# Patient Record
Sex: Female | Born: 1965
Health system: Southern US, Community
[De-identification: ages and names within clinical notes are randomized; demographics above are authoritative.]

## PROBLEM LIST (undated history)

## (undated) DIAGNOSIS — G43709 Chronic migraine without aura, not intractable, without status migrainosus: Secondary | ICD-10-CM

## (undated) DIAGNOSIS — E785 Hyperlipidemia, unspecified: Secondary | ICD-10-CM

## (undated) DIAGNOSIS — F32A Depression, unspecified: Secondary | ICD-10-CM

## (undated) DIAGNOSIS — G43109 Migraine with aura, not intractable, without status migrainosus: Secondary | ICD-10-CM

## (undated) DIAGNOSIS — K259 Gastric ulcer, unspecified as acute or chronic, without hemorrhage or perforation: Secondary | ICD-10-CM

## (undated) DIAGNOSIS — R112 Nausea with vomiting, unspecified: Secondary | ICD-10-CM

## (undated) DIAGNOSIS — F429 Obsessive-compulsive disorder, unspecified: Secondary | ICD-10-CM

## (undated) DIAGNOSIS — IMO0002 Reserved for concepts with insufficient information to code with codable children: Secondary | ICD-10-CM

## (undated) DIAGNOSIS — D649 Anemia, unspecified: Secondary | ICD-10-CM

## (undated) DIAGNOSIS — F329 Major depressive disorder, single episode, unspecified: Secondary | ICD-10-CM

## (undated) DIAGNOSIS — N920 Excessive and frequent menstruation with regular cycle: Secondary | ICD-10-CM

## (undated) DIAGNOSIS — N369 Urethral disorder, unspecified: Secondary | ICD-10-CM

## (undated) DIAGNOSIS — I10 Essential (primary) hypertension: Secondary | ICD-10-CM

## (undated) DIAGNOSIS — Z9889 Other specified postprocedural states: Secondary | ICD-10-CM

## (undated) DIAGNOSIS — D219 Benign neoplasm of connective and other soft tissue, unspecified: Secondary | ICD-10-CM

## (undated) DIAGNOSIS — K649 Unspecified hemorrhoids: Secondary | ICD-10-CM

## (undated) DIAGNOSIS — K219 Gastro-esophageal reflux disease without esophagitis: Secondary | ICD-10-CM

## (undated) HISTORY — DX: Migraine with aura, not intractable, without status migrainosus: G43.109

## (undated) HISTORY — DX: Essential (primary) hypertension: I10

## (undated) HISTORY — PX: TONSILLECTOMY: SUR1361

## (undated) HISTORY — DX: Anemia, unspecified: D64.9

## (undated) HISTORY — DX: Gastric ulcer, unspecified as acute or chronic, without hemorrhage or perforation: K25.9

## (undated) HISTORY — DX: Hyperlipidemia, unspecified: E78.5

## (undated) HISTORY — PX: ROBOTIC ASSISTED TOTAL HYSTERECTOMY: SHX6085

## (undated) HISTORY — DX: Unspecified hemorrhoids: K64.9

## (undated) HISTORY — PX: OTHER SURGICAL HISTORY: SHX169

## (undated) HISTORY — DX: Benign neoplasm of connective and other soft tissue, unspecified: D21.9

## (undated) HISTORY — DX: Obsessive-compulsive disorder, unspecified: F42.9

## (undated) HISTORY — DX: Excessive and frequent menstruation with regular cycle: N92.0

---

## 1998-08-14 ENCOUNTER — Other Ambulatory Visit: Admission: RE | Admit: 1998-08-14 | Discharge: 1998-08-14 | Payer: Self-pay | Admitting: Obstetrics and Gynecology

## 1999-08-27 ENCOUNTER — Other Ambulatory Visit: Admission: RE | Admit: 1999-08-27 | Discharge: 1999-08-27 | Payer: Self-pay | Admitting: Obstetrics and Gynecology

## 2000-09-14 ENCOUNTER — Other Ambulatory Visit: Admission: RE | Admit: 2000-09-14 | Discharge: 2000-09-14 | Payer: Self-pay | Admitting: Obstetrics and Gynecology

## 2000-12-13 ENCOUNTER — Other Ambulatory Visit: Admission: RE | Admit: 2000-12-13 | Discharge: 2000-12-13 | Payer: Self-pay | Admitting: Obstetrics and Gynecology

## 2002-10-02 ENCOUNTER — Other Ambulatory Visit: Admission: RE | Admit: 2002-10-02 | Discharge: 2002-10-02 | Payer: Self-pay | Admitting: Obstetrics and Gynecology

## 2003-11-28 ENCOUNTER — Other Ambulatory Visit: Admission: RE | Admit: 2003-11-28 | Discharge: 2003-11-28 | Payer: Self-pay | Admitting: Obstetrics and Gynecology

## 2005-06-09 ENCOUNTER — Other Ambulatory Visit: Admission: RE | Admit: 2005-06-09 | Discharge: 2005-06-09 | Payer: Self-pay | Admitting: Obstetrics and Gynecology

## 2005-07-09 ENCOUNTER — Encounter: Admission: RE | Admit: 2005-07-09 | Discharge: 2005-07-09 | Payer: Self-pay | Admitting: Obstetrics and Gynecology

## 2006-09-06 ENCOUNTER — Encounter: Admission: RE | Admit: 2006-09-06 | Discharge: 2006-09-06 | Payer: Self-pay | Admitting: Obstetrics and Gynecology

## 2006-10-18 ENCOUNTER — Other Ambulatory Visit: Admission: RE | Admit: 2006-10-18 | Discharge: 2006-10-18 | Payer: Self-pay | Admitting: Obstetrics and Gynecology

## 2007-10-24 ENCOUNTER — Other Ambulatory Visit: Admission: RE | Admit: 2007-10-24 | Discharge: 2007-10-24 | Payer: Self-pay | Admitting: Obstetrics and Gynecology

## 2008-08-23 DIAGNOSIS — I10 Essential (primary) hypertension: Secondary | ICD-10-CM

## 2008-08-23 HISTORY — DX: Essential (primary) hypertension: I10

## 2008-11-25 ENCOUNTER — Other Ambulatory Visit: Admission: RE | Admit: 2008-11-25 | Discharge: 2008-11-25 | Payer: Self-pay | Admitting: Obstetrics and Gynecology

## 2008-12-17 ENCOUNTER — Ambulatory Visit (HOSPITAL_BASED_OUTPATIENT_CLINIC_OR_DEPARTMENT_OTHER): Admission: RE | Admit: 2008-12-17 | Discharge: 2008-12-17 | Payer: Self-pay | Admitting: Obstetrics and Gynecology

## 2008-12-17 ENCOUNTER — Encounter: Payer: Self-pay | Admitting: Obstetrics and Gynecology

## 2009-07-02 ENCOUNTER — Encounter: Admission: RE | Admit: 2009-07-02 | Discharge: 2009-07-02 | Payer: Self-pay | Admitting: Family Medicine

## 2009-09-18 ENCOUNTER — Inpatient Hospital Stay (HOSPITAL_COMMUNITY): Admission: AD | Admit: 2009-09-18 | Discharge: 2009-09-19 | Payer: Self-pay | Admitting: Obstetrics & Gynecology

## 2009-11-25 ENCOUNTER — Encounter: Payer: Self-pay | Admitting: Obstetrics and Gynecology

## 2009-11-25 ENCOUNTER — Ambulatory Visit (HOSPITAL_COMMUNITY): Admission: RE | Admit: 2009-11-25 | Discharge: 2009-11-26 | Payer: Self-pay | Admitting: Obstetrics and Gynecology

## 2010-02-13 ENCOUNTER — Encounter: Admission: RE | Admit: 2010-02-13 | Discharge: 2010-02-13 | Payer: Self-pay | Admitting: Obstetrics and Gynecology

## 2010-11-09 LAB — CBC
HCT: 24.3 % — ABNORMAL LOW (ref 36.0–46.0)
HCT: 32.6 % — ABNORMAL LOW (ref 36.0–46.0)
Hemoglobin: 11.1 g/dL — ABNORMAL LOW (ref 12.0–15.0)
Hemoglobin: 8.3 g/dL — ABNORMAL LOW (ref 12.0–15.0)
MCHC: 34 g/dL (ref 30.0–36.0)
MCHC: 34.1 g/dL (ref 30.0–36.0)
MCV: 89.1 fL (ref 78.0–100.0)
MCV: 89.8 fL (ref 78.0–100.0)
Platelets: 189 10*3/uL (ref 150–400)
Platelets: 263 10*3/uL (ref 150–400)
RBC: 2.7 MIL/uL — ABNORMAL LOW (ref 3.87–5.11)
RBC: 3.66 MIL/uL — ABNORMAL LOW (ref 3.87–5.11)
RDW: 13 % (ref 11.5–15.5)
RDW: 13.3 % (ref 11.5–15.5)
WBC: 7.6 10*3/uL (ref 4.0–10.5)
WBC: 8.6 10*3/uL (ref 4.0–10.5)

## 2010-11-11 LAB — COMPREHENSIVE METABOLIC PANEL
ALT: 8 U/L (ref 0–35)
AST: 15 U/L (ref 0–37)
Albumin: 3.7 g/dL (ref 3.5–5.2)
Alkaline Phosphatase: 51 U/L (ref 39–117)
BUN: 10 mg/dL (ref 6–23)
CO2: 30 mEq/L (ref 19–32)
Calcium: 9.2 mg/dL (ref 8.4–10.5)
Chloride: 105 mEq/L (ref 96–112)
Creatinine, Ser: 0.63 mg/dL (ref 0.4–1.2)
GFR calc Af Amer: >60 mL/min (ref >60)
GFR calc non Af Amer: >60 mL/min (ref >60)
Glucose, Bld: 101 mg/dL — ABNORMAL HIGH (ref 70–99)
Potassium: 3.7 mEq/L (ref 3.5–5.1)
Sodium: 142 mEq/L (ref 135–145)
Total Bilirubin: 0.2 mg/dL — ABNORMAL LOW (ref 0.3–1.2)
Total Protein: 7 g/dL (ref 6.0–8.3)

## 2010-11-11 LAB — CBC
HCT: 35 % — ABNORMAL LOW (ref 36.0–46.0)
HCT: 38.1 % (ref 36.0–46.0)
Hemoglobin: 11.6 g/dL — ABNORMAL LOW (ref 12.0–15.0)
Hemoglobin: 12.5 g/dL (ref 12.0–15.0)
MCHC: 32.7 g/dL (ref 30.0–36.0)
MCHC: 33.1 g/dL (ref 30.0–36.0)
MCV: 89.2 fL (ref 78.0–100.0)
MCV: 89.2 fL (ref 78.0–100.0)
Platelets: 268 10*3/uL (ref 150–400)
Platelets: 287 10*3/uL (ref 150–400)
RBC: 3.93 MIL/uL (ref 3.87–5.11)
RBC: 4.27 MIL/uL (ref 3.87–5.11)
RDW: 13.1 % (ref 11.5–15.5)
RDW: 13.3 % (ref 11.5–15.5)
WBC: 13.7 10*3/uL — ABNORMAL HIGH (ref 4.0–10.5)
WBC: 6.1 10*3/uL (ref 4.0–10.5)

## 2010-11-11 LAB — HCG, SERUM, QUALITATIVE: Preg, Serum: NEGATIVE

## 2010-12-02 LAB — BASIC METABOLIC PANEL
BUN: 9 mg/dL (ref 6–23)
CO2: 26 mEq/L (ref 19–32)
Calcium: 9 mg/dL (ref 8.4–10.5)
Chloride: 101 mEq/L (ref 96–112)
Creatinine, Ser: 0.54 mg/dL (ref 0.4–1.2)
GFR calc Af Amer: >60 mL/min (ref >60)
GFR calc non Af Amer: >60 mL/min (ref >60)
Glucose, Bld: 92 mg/dL (ref 70–99)
Potassium: 4.4 mEq/L (ref 3.5–5.1)
Sodium: 134 mEq/L — ABNORMAL LOW (ref 135–145)

## 2010-12-02 LAB — CBC
HCT: 35.7 % — ABNORMAL LOW (ref 36.0–46.0)
Hemoglobin: 12.1 g/dL (ref 12.0–15.0)
MCHC: 33.8 g/dL (ref 30.0–36.0)
MCV: 89.3 fL (ref 78.0–100.0)
Platelets: 300 10*3/uL (ref 150–400)
RBC: 3.99 MIL/uL (ref 3.87–5.11)
RDW: 13.3 % (ref 11.5–15.5)
WBC: 5.4 10*3/uL (ref 4.0–10.5)

## 2010-12-02 LAB — HCG, SERUM, QUALITATIVE: Preg, Serum: NEGATIVE

## 2011-01-05 NOTE — Op Note (Signed)
Kristina Dunlap, Kristina Dunlap               ACCOUNT NO.:  [id number]   MEDICAL RECORD NO.:  [id number]          PATIENT TYPE:  AMB   LOCATION:  [facility]                         FACILITY:  [facility]   PHYSICIAN:  Cynthia P. Romine, M.D.DATE OF BIRTH:  Aug 10, 1966   DATE OF PROCEDURE:  12/17/2008  DATE OF DISCHARGE:                               OPERATIVE REPORT   PREOPERATIVE DIAGNOSIS:  Menorrhagia, endometrial polyps seen on office  sonohysterogram.   POSTOPERATIVE DIAGNOSIS:  Menorrhagia, endometrial polyps seen on office  sonohysterogram.  Path pending.   PROCEDURE:  Hysteroscopic resection of endometrial polyp, D and C.   SURGEON:  Cynthia P. Romine, M.D.   ANESTHESIA:  General by LMA.   ESTIMATED BLOOD LOSS:  Minimal.  Sorbitol deficit 55 mL.   COMPLICATIONS:  None.   PROCEDURE:  The patient was taken to the operating room and after  induction of adequate general anesthesia by LMA, was placed in the  dorsal lithotomy position and prepped and draped in the usual fashion.  A posterior weighted and anterior Sims retractor were placed and the  cervix grasped on its anterior lip with a single-tooth tenaculum.  Paracervical block was instituted by injecting 10 mL of 1% Xylocaine at  each of 3 and 9 o'clock.  The cervix was dilated to a #21 Shawnie Pons.  The  uterus was then sounded to 9 cm.  The cervix was dilated up to a #31  Pratt.  The operative hysteroscope was introduced.  Sorbitol was used as  the distention medium.  The pump pressure was set at 70.  Hysteroscopy  revealed there to be multiple endometrial polyps practically filling the  cavity.  Photographic documentation was taken.  Single loop cautery was  then used to remove the polyp in several passes.  Upon completion of the  resection, the cavity did appear clear.  Both tubal ostia were seen and  appeared normal and photographic documentation, again, was taken of the  cavity after the resection.  The scope was withdrawn.  Gentle sharp  curettage was carried out and the specimen was sent to pathology as a  single specimen.  The instruments were removed from the vagina and the  procedure was terminated.  The patient tolerated it well, in  satisfactory condition to post anesthesia recovery.      Cynthia P. Romine, M.D.  Electronically Signed    CPR/MEDQ  D:  12/17/2008  T:  12/17/2008  Job:  [id number]   cc:   Edwena Felty. Romine, M.D.  Fax: [phone]

## 2011-08-27 ENCOUNTER — Other Ambulatory Visit: Payer: Self-pay | Admitting: Obstetrics and Gynecology

## 2011-08-27 DIAGNOSIS — Z1231 Encounter for screening mammogram for malignant neoplasm of breast: Secondary | ICD-10-CM

## 2011-09-13 ENCOUNTER — Ambulatory Visit
Admission: RE | Admit: 2011-09-13 | Discharge: 2011-09-13 | Disposition: A | Discharge status: Home / Self Care | Payer: 59 | Source: Ambulatory Visit | Attending: Obstetrics and Gynecology | Admitting: Obstetrics and Gynecology

## 2011-09-13 DIAGNOSIS — Z1231 Encounter for screening mammogram for malignant neoplasm of breast: Secondary | ICD-10-CM

## 2012-08-18 ENCOUNTER — Other Ambulatory Visit: Payer: Self-pay | Admitting: Obstetrics and Gynecology

## 2012-08-18 DIAGNOSIS — Z1231 Encounter for screening mammogram for malignant neoplasm of breast: Secondary | ICD-10-CM

## 2012-08-30 ENCOUNTER — Ambulatory Visit (INDEPENDENT_AMBULATORY_CARE_PROVIDER_SITE_OTHER): Payer: 59 | Admitting: General Surgery

## 2012-08-30 ENCOUNTER — Encounter (INDEPENDENT_AMBULATORY_CARE_PROVIDER_SITE_OTHER): Payer: Self-pay | Admitting: General Surgery

## 2012-08-30 ENCOUNTER — Telehealth (INDEPENDENT_AMBULATORY_CARE_PROVIDER_SITE_OTHER): Payer: Self-pay

## 2012-08-30 ENCOUNTER — Encounter: Payer: Self-pay | Admitting: Internal Medicine

## 2012-08-30 VITALS — BP 118/82 | HR 72 | Temp 98.6°F | Resp 18 | Ht 65.0 in | Wt 147.0 lb

## 2012-08-30 DIAGNOSIS — K649 Unspecified hemorrhoids: Secondary | ICD-10-CM | POA: Insufficient documentation

## 2012-08-30 DIAGNOSIS — K644 Residual hemorrhoidal skin tags: Secondary | ICD-10-CM

## 2012-08-30 NOTE — Patient Instructions (Addendum)
Fiber Chart  You should 25-30g of fiber per day and drinking 8 glasses of water to help your bowels move regularly.  In the chart below you can look up how much fiber you are getting in an average day.  If you are not getting enough fiber, you should add a fiber supplement to your diet.  Examples of this include Metamucil, FiberCon and Citrucel.  These can be purchased at your local grocery store or pharmacy.      [email or web address]   [place]. Irregular bowel habits such as constipation can lead to many problems over time.  Having one soft bowel movement a day is the most important way to prevent further problems.  The anorectal canal is designed to handle stretching and feces to safely manage our ability to get rid of solid waste (feces, poop, stool) out of our body.  BUT, hard constipated stools can act like ripping concrete bricks causing inflamed hemorrhoids, anal fissures, abdominal pain and bloating.     The goal: ONE SOFT BOWEL MOVEMENT A DAY!  To have soft, regular bowel movements:    Drink at least 8 tall glasses of water a day.     Take plenty of fiber.  Fiber is the undigested part of plant food that passes into the colon, acting s "natures broom" to encourage bowel motility and movement.  Fiber can absorb and hold large amounts of water. This results in a larger, bulkier stool, which is soft and easier to pass. Work gradually over several weeks up to 6 servings a day of fiber (25g a day even more if needed) in the form of: o Vegetables -- Root (potatoes, carrots, turnips), leafy green (lettuce, salad greens, celery, spinach), or cooked high residue (cabbage, broccoli, etc) o Fruit -- Fresh (unpeeled skin & pulp), Dried (prunes, apricots, cherries, etc ),  or stewed ( applesauce)  o Whole grain breads, pasta, etc (whole wheat)  o Bran cereals    Bulking Agents -- This type of water-retaining fiber generally is  easily obtained each day by one of the following:  o Psyllium bran -- The psyllium plant is remarkable because its ground seeds can retain so much water. This product is available as Metamucil, Konsyl, Effersyllium, Per Diem Fiber, or the less expensive generic preparation in drug and health food stores. Although labeled a laxative, it really is not a laxative.  o Methylcellulose -- This is another fiber derived from wood which also retains water. It is available as Citrucel. o Polyethylene Glycol - and "artificial" fiber commonly called Miralax or Glycolax.  It is helpful for people with gassy or bloated feelings with regular fiber o Flax Seed - a less gassy fiber than psyllium   No reading or other relaxing activity while on the toilet. If bowel movements take longer than 5 minutes, you are too constipated   AVOID CONSTIPATION.  High fiber and water intake usually takes care of this.  Sometimes a laxative is needed to stimulate more frequent bowel movements, but    Laxatives are not a good long-term solution as it can wear the colon out. o Osmotics (Milk of Magnesia, Fleets phosphosoda, Magnesium citrate, MiraLax, GoLytely) are safer than  o Stimulants (Senokot, Castor Oil, Dulcolax, Ex Lax)    o Do not take laxatives for more than 7days in a row.    IF SEVERELY CONSTIPATED, try a Bowel Retraining Program: o Do not use laxatives.  o Eat a diet high in roughage, such as  bran cereals and leafy vegetables.  o Drink six (6) ounces of prune or apricot juice each morning.  o Eat two (2) large servings of stewed fruit each day.  o Take one (1) heaping tablespoon of a psyllium-based bulking agent twice a day. Use sugar-free sweetener when possible to avoid excessive calories.  o Eat a normal breakfast.  o Set aside 15 minutes after breakfast to sit on the toilet, but do not strain to have a bowel movement.  o If you do not have a bowel movement by the third day, use an enema and repeat the above steps.         [facility] [place] [facility]  ONE-DAY (1) PRE-OP HOME COLON PREP INSTRUCTIONS: ** MIRALAX / GATORADE PREP **  You must follow the instructions below carefully.  If you have questions or problems, please call and speak to someone in the clinic department at our office:   [phone].     INSTRUCTIONS: 1. Five days prior to your procedure do not eat nuts, popcorn, or fruit with seeds.  Stop all fiber supplements such as Metamucil, Citrucel, etc. 2. Two days before surgery fill the prescription at a pharmacy of your choice and purchase the additional supplies below.         MIRALAX - GATORADE -- DULCOLAX TABS:   Purchase a bottle of MIRALAX  (255 gm bottle)    In addition, purchase four (4) DULCOLAX TABLETS (no prescription required- ask the pharmacist if you can't find them)    Purchase one 64 oz GATORADE.  (Do NOT purchase red Gatorade; any other flavor is acceptable) and place in refrigerator to get cold.  3.   Day Before Surgery:   6 am: take the 4 Dulcolax tablets   You may only have clear liquids (tea, coffee, juice, broth, jello, soft drinks, gummy bears).  You cannot have solid foods, cream, milk or milk products.  Drink at lease 8 ounces of liquids every hour while awake.   Mix the entire bottle of MiraLax and the Gatorade in a large container.    10:00am: Begin drinking the Gatorade mixture until gone (8 oz every 15-30 minutes).      You may suck on a lime wedge or hard candy to "freshen your palate" in between glasses   If you are a diabetic, take your blood sugar reading several time throughout the prep.  Have some juice available to take if your sugar level gets too low   You may feel chilled while taking the prep.  Have some warm tea or broth to help warm up.   Continue clear liquids until midnight or bedtime  3. The day of your procedure:   Do not eat or drink ANYTHING after midnight before your surgery.     If you take Heart or Blood Pressure medicine, ask the pre-op  nurses about these during your preop appointment.   Further pre-operative instructions will be given to you from the hospital.   Expect to be contacted 5-7 days before your surgery.

## 2012-08-30 NOTE — Progress Notes (Signed)
Chief Complaint  Patient presents with  . New Evaluation    eval hems    HISTORY: Kristina Dunlap is a 47 y.o. female who presents to the office with rectal pain.  Other symptoms include bleeding, swelling.  She has tried proctofoam and OTC treatments in the past with some success.  BM's makes the symptoms worse.  This had been occurring for 10 yres.  It is intermittent in nature. Her bowel habits are regular bowel movements are usually soft.  Her fiber intake is minimal.  She has never had a colonoscopy.  She does admit to some straining. Blood on TP.  She has not noticed any prolapsing tissue.      Past Medical History  Diagnosis Date  . Hyperlipidemia   . Hypertension   . Anemia       Past Surgical History  Procedure Date  . Tonsillectomy   . Abdominal hysterectomy         Current Outpatient Prescriptions  Medication Sig Dispense Refill  . cholecalciferol (VITAMIN D) 1000 UNITS tablet Take 5,000 Units by mouth daily.      . citalopram (CELEXA) 40 MG tablet Take 40 mg by mouth daily.      Marland Kitchen lisinopril (PRINIVIL,ZESTRIL) 10 MG tablet Take 10 mg by mouth daily.      . Multiple Vitamins-Minerals (MULTIVITAMIN WITH MINERALS) tablet Take 1 tablet by mouth daily.          Allergies  Allergen Reactions  . Erythromycin Nausea And Vomiting      No family history on file.  History   Social History  . Marital Status: Married    Spouse Name: N/A    Number of Children: N/A  . Years of Education: N/A   Social History Main Topics  . Smoking status: Never Smoker   . Smokeless tobacco: Not on file  . Alcohol Use: Yes  . Drug Use: No  . Sexually Active:    Other Topics Concern  . Not on file   Social History Narrative  . No narrative on file      REVIEW OF SYSTEMS - PERTINENT POSITIVES ONLY: Review of Systems - General ROS: negative for - chills or fever Hematological and Lymphatic ROS: negative for - bleeding problems or blood clots Respiratory ROS: no cough, shortness of  breath, or wheezing Cardiovascular ROS: no chest pain or dyspnea on exertion Gastrointestinal ROS: positive for occasional abdominal pain, negative for change in bowel habits, or black stools  EXAM: Filed Vitals:   08/30/12 1321  BP: 118/82  Pulse: 72  Temp: 98.6 F (37 C)  Resp: 18    General appearance: alert and cooperative Resp: clear to auscultation bilaterally Cardio: regular rate and rhythm GI: soft, non-tender; bowel sounds normal; no masses,  no organomegaly   Procedure: Anoscopy Surgeon: Maisie Fus Diagnosis: anal pain  Assistant: Christella Scheuermann After the risks and benefits were explained, verbal consent was obtained for above procedure  Anesthesia: none Findings: large L lat skin tag, small R post skin tag, posterior anal canal papilla, minimal internal hemorrhoids, hard stool in rectal vault   ASSESSMENT AND PLAN: Kristina Dunlap is a 47 y.o. F who presents to me with complaints of anal pain and bleeding.  It appears to me that her symptoms are probably due to recurrent anal fissures and possibly flare ups of external hemorrhoids.  She appears to suffer from long term constipation.  I have recommended that she increase her fiber and water intake to help with this.  Given her bleeding, abd pain and age, I think she should also have a colonoscopy.  I have offered to perform this or refer her to GI.  She will call the office if her symptoms persist on the high fiber diet.    Vanita Panda, MD Colon and Rectal Surgery / General Surgery [facility]      Visit Diagnoses: 1. Hemorrhoids, external   2. Hemorrhoids     Primary Care Physician: Hollice Espy, MD

## 2012-08-30 NOTE — Telephone Encounter (Signed)
I called the pt back because she wanted to be referred to a gi specialist.  I made an appointment for her to see Dr Rhea Belton 09/08/12 at 2:30 and she needs to arrive at 2:15.  I left a message for her to call me.

## 2012-08-30 NOTE — Telephone Encounter (Signed)
I called the pt again and gave her the appointment information.

## 2012-09-07 ENCOUNTER — Encounter: Payer: Self-pay | Admitting: Internal Medicine

## 2012-09-08 ENCOUNTER — Encounter: Payer: Self-pay | Admitting: Internal Medicine

## 2012-09-08 ENCOUNTER — Ambulatory Visit (INDEPENDENT_AMBULATORY_CARE_PROVIDER_SITE_OTHER): Payer: 59 | Admitting: Internal Medicine

## 2012-09-08 VITALS — BP 146/74 | HR 88 | Ht 65.0 in | Wt 144.0 lb

## 2012-09-08 DIAGNOSIS — K625 Hemorrhage of anus and rectum: Secondary | ICD-10-CM

## 2012-09-08 MED ORDER — PEG-KCL-NACL-NASULF-NA ASC-C 100 G PO SOLR: 1.0000 | Freq: Once | ORAL | Status: DC

## 2012-09-08 NOTE — Patient Instructions (Addendum)
You have been scheduled for a colonoscopy with propofol. Please follow written instructions given to you at your visit today.  Please pick up your prep kit at the pharmacy within the next 1-3 days. If you use inhalers (even only as needed) or a CPAP machine, please bring them with you on the day of your procedure.  

## 2012-09-08 NOTE — Progress Notes (Signed)
Patient ID: Kristina Dunlap, female   DOB: 1966-04-16, 47 y.o.   MRN: [id number]  SUBJECTIVE: HPI Ms. Kristina Dunlap is a 47 yo female with PMH of hypertension, hyperlipidemia, hemorrhoids and likely anal fissures who is seen in consultation at the request of Dr. Maisie Fus for evaluation of intermittent rectal bleeding. The patient reports a long-standing history of on and off rectal bleeding dating back 20 years or more. She was recently seen by Dr. Maisie Fus of colorectal surgery and recommended that she have a colonoscopy. The patient denies abdominal pain. She does have a history of hard stools and occasional constipation. She has seen red blood per rectum multiple days per week both on the toilet tissue and on her stool. Occasionally has dripped into the toilet. Occasionally it is painful. She denies diarrhea. She has tried to increase the fiber in her diet which she seemed to help a little bit. No nausea or vomiting. No weight loss. No change in bowel habits. No family history of colorectal cancer or polyps to her knowledge. No trouble with significant heartburn or trouble swallowing. She has never had a colonoscopy. She does report a history of anemia though mild  Review of Systems  As per history of present illness, otherwise negative   Past Medical History  Diagnosis Date  . Hyperlipidemia   . Hypertension   . Anemia     Current Outpatient Prescriptions  Medication Sig Dispense Refill  . Aspirin-Acetaminophen-Caffeine (GOODY HEADACHE PO) Take 1 Package by mouth daily.      . cetirizine (ZYRTEC) 10 MG tablet Take 10 mg by mouth daily.      . cholecalciferol (VITAMIN D) 1000 UNITS tablet Take 5,000 Units by mouth daily.      . citalopram (CELEXA) 40 MG tablet Take 40 mg by mouth daily.      Marland Kitchen lisinopril (PRINIVIL,ZESTRIL) 10 MG tablet Take 10 mg by mouth daily.      . Multiple Vitamins-Minerals (MULTIVITAMIN WITH MINERALS) tablet Take 1 tablet by mouth daily.      . peg 3350 powder (MOVIPREP) 100 G  SOLR Take 1 kit (100 g total) by mouth once.  1 kit  0    Allergies  Allergen Reactions  . Erythromycin Nausea And Vomiting    Family History  Problem Relation Age of Onset  . Heart disease Maternal Grandmother   . Heart disease Maternal Grandfather   . Heart disease Father     History  Substance Use Topics  . Smoking status: Never Smoker   . Smokeless tobacco: Never Used  . Alcohol Use: Yes     Comment: 3 glasses in one week    OBJECTIVE: BP 146/74  Pulse 88  Ht 5\' 5"  (1.651 m)  Wt 144 lb (65.318 kg)  BMI 23.96 kg/m2 Constitutional: Well-developed and well-nourished. No distress. HEENT: Normocephalic and atraumatic. Oropharynx is clear and moist. No oropharyngeal exudate. Conjunctivae are normal. No scleral icterus. Cardiovascular: Normal rate, regular rhythm and intact distal pulses. No M/R/G Pulmonary/chest: Effort normal and breath sounds normal. No wheezing, rales or rhonchi. Abdominal: Soft, nontender, nondistended. Bowel sounds active throughout. There are no masses palpable. No hepatosplenomegaly. Extremities: no clubbing, cyanosis, or edema. Neurological: Alert and oriented to person place and time. Skin: Skin is warm and dry. No rashes noted. Psychiatric: Normal mood and affect. Behavior is normal.  ASSESSMENT AND PLAN: 47 yo female with PMH of hypertension, hyperlipidemia, hemorrhoids and likely anal fissures who is seen in consultation at the request of Dr. Maisie Fus for evaluation  of intermittent rectal bleeding.  1.  Intermittent rectal bleeding -- the patient has not yet reached 47 years old when one would normally start general colorectal cancer screening, however given her intermittent rectal bleeding, I have recommended direct visualization with colonoscopy. We discussed this test today including the risks and benefits and she is agreeable to proceed. She has used Proctofoam successfully for her hemorrhoids when they flare, and she will continue this for now.  If anal fissures are documented at colonoscopy we could try topical diltiazem. Further recommendations after procedure

## 2012-09-13 ENCOUNTER — Ambulatory Visit
Admission: RE | Admit: 2012-09-13 | Discharge: 2012-09-13 | Disposition: A | Discharge status: Home / Self Care | Payer: 59 | Source: Ambulatory Visit | Attending: Obstetrics and Gynecology | Admitting: Obstetrics and Gynecology

## 2012-09-13 DIAGNOSIS — Z1231 Encounter for screening mammogram for malignant neoplasm of breast: Secondary | ICD-10-CM

## 2012-09-21 ENCOUNTER — Other Ambulatory Visit: Payer: Self-pay | Admitting: Gastroenterology

## 2012-10-19 ENCOUNTER — Other Ambulatory Visit: Payer: Self-pay | Admitting: Gastroenterology

## 2012-10-27 ENCOUNTER — Encounter: Payer: 59 | Admitting: Internal Medicine

## 2013-01-02 ENCOUNTER — Encounter: Payer: Self-pay | Admitting: Obstetrics and Gynecology

## 2013-01-05 ENCOUNTER — Encounter: Payer: Self-pay | Admitting: Obstetrics and Gynecology

## 2013-01-05 ENCOUNTER — Ambulatory Visit (INDEPENDENT_AMBULATORY_CARE_PROVIDER_SITE_OTHER): Payer: 59 | Admitting: Obstetrics and Gynecology

## 2013-01-05 VITALS — BP 132/70 | Ht 65.0 in | Wt 142.0 lb

## 2013-01-05 DIAGNOSIS — Z01419 Encounter for gynecological examination (general) (routine) without abnormal findings: Secondary | ICD-10-CM

## 2013-01-05 DIAGNOSIS — Z23 Encounter for immunization: Secondary | ICD-10-CM

## 2013-01-05 DIAGNOSIS — Z0000 Encounter for general adult medical examination without abnormal findings: Secondary | ICD-10-CM

## 2013-01-05 LAB — POCT URINALYSIS DIPSTICK
Bilirubin, UA: NEGATIVE
Ketones, UA: NEGATIVE
Leukocytes, UA: NEGATIVE
Nitrite, UA: NEGATIVE

## 2013-01-05 LAB — HEMOGLOBIN, FINGERSTICK: Hemoglobin, fingerstick: 12.9 g/dL (ref 12.0–16.0)

## 2013-01-05 NOTE — Patient Instructions (Signed)

## 2013-01-05 NOTE — Progress Notes (Deleted)
47 y.o.   Married    Caucasian   female   G0P0000   here for annual exam.    No LMP recorded. Patient has had a hysterectomy.          Sexually active: yes  The current method of family planning is status post hysterectomy.    Exercising: walking 3-4 days a week, weights 2 times a week Last mammogram:  09/13/12 neg Last pap smear:11/2008 neg History of abnormal pap: yes Smoking:never Alcohol:1-2 beers a week Last colonoscopy:never Last Bone Density:  never Last tetanus shot:less than 10 years Last cholesterol check: 2013 elevated    Total 240  Hgb: 12.9               Urine: neg   Family History  Problem Relation Age of Onset  . Heart disease Maternal Grandmother   . Heart disease Maternal Grandfather   . Heart disease Father   . Hyperlipidemia Mother     Patient Active Problem List   Diagnosis Date Noted  . Hemorrhoids 08/30/2012    Past Medical History  Diagnosis Date  . Hyperlipidemia   . Hypertension   . Anemia   . Migraine headache with aura 2012  . Hemorrhoids   . OCD (obsessive compulsive disorder)     Past Surgical History  Procedure Laterality Date  . Tonsillectomy    . Dilatation & currettage/hysteroscopy with resectocope  12/17/08    of polyps  . Abdominal hysterectomy Right 11/25/09    [facility]- for menorrhagia- path simple hyperplasia fibroids 104 g  . Dilation and curettage of uterus    . Hysteroscopy  12/17/08    resection polyps    Allergies: Erythromycin  Current Outpatient Prescriptions  Medication Sig Dispense Refill  . Aspirin-Acetaminophen-Caffeine (GOODY HEADACHE PO) Take 1 Package by mouth daily.      . cetirizine (ZYRTEC) 10 MG tablet Take 10 mg by mouth daily.      . cholecalciferol (VITAMIN D) 1000 UNITS tablet Take 5,000 Units by mouth daily.      . citalopram (CELEXA) 40 MG tablet Take 40 mg by mouth daily.      . Hydrocortisone Ace-Pramoxine (PROCTOFOAM HC RE) Place rectally.      Marland Kitchen lisinopril (PRINIVIL,ZESTRIL) 10 MG tablet Take 20 mg  by mouth daily.       . Misc Natural Products (OSTEO BI-FLEX JOINT SHIELD PO) Take by mouth daily.       . Multiple Vitamins-Minerals (MULTIVITAMIN WITH MINERALS) tablet Take 1 tablet by mouth daily.      . clindamycin (CLEOCIN) 150 MG capsule       . hydrochlorothiazide (MICROZIDE) 12.5 MG capsule        No current facility-administered medications for this visit.    ROS: Pertinent items are noted in HPI.  Social Hx:    Exam:    BP 132/70  Ht 5\' 5"  (1.651 m)  Wt 142 lb (64.411 kg)  BMI 23.63 kg/m2   Wt Readings from Last 3 Encounters:  01/05/13 142 lb (64.411 kg)  09/08/12 144 lb (65.318 kg)  08/30/12 147 lb (66.679 kg)     Ht Readings from Last 3 Encounters:  01/05/13 5\' 5"  (1.651 m)  09/08/12 5\' 5"  (1.651 m)  08/30/12 5\' 5"  (1.651 m)    General appearance: alert, cooperative and appears stated age Head: Normocephalic, without obvious abnormality, atraumatic Neck: no adenopathy, supple, symmetrical, trachea midline and thyroid not enlarged, symmetric, no tenderness/mass/nodules Lungs: clear to auscultation bilaterally Breasts: Inspection  negative, No nipple retraction or dimpling, No nipple discharge or bleeding, No axillary or supraclavicular adenopathy, Normal to palpation without dominant masses Heart: regular rate and rhythm Abdomen: soft, non-tender; bowel sounds normal; no masses,  no organomegaly Extremities: extremities normal, atraumatic, no cyanosis or edema Skin: Skin color, texture, turgor normal. No rashes or lesions Lymph nodes: Cervical, supraclavicular, and axillary nodes normal. No abnormal inguinal nodes palpated Neurologic: Grossly normal   Pelvic: External genitalia:  no lesions              Urethra:  normal appearing urethra with no masses, tenderness or lesions              Bartholins and Skenes: normal                 Vagina: normal appearing vagina with normal color and discharge, no lesions              Cervix: normal appearance               Pap taken: {yes no:314532}        Bimanual Exam:  Uterus:  uterus is normal size, shape, consistency and nontender                                      Adnexa: normal adnexa in size, nontender and no masses                                      Rectovaginal: Confirms                                      Anus:  normal sphincter tone, no lesions  A: normal menopausal exam     P: {plan; gyn:5269::"mammogram","pap smear","return annually or prn"}     An After Visit Summary was printed and given to the patient.

## 2013-01-05 NOTE — Progress Notes (Signed)
47 y.o.   Married    Caucasian   female   G0P0000   here for annual exam.    No LMP recorded. Patient has had a hysterectomy.          Sexually active: yes  The current method of family planning is hysterectomy..    Exercising:  Last mammogram: Jan 2014 - nl  Last pap smear:  2010 History of abnormal pap: no Smoking: no Alcohol:social Last colonoscopy:none Last Bone Density:  none Last tetanus shot :Feb 2004 Last cholesterol check:   Hgb: 12.9g              Urine: neg   Family History  Problem Relation Age of Onset  . Heart disease Maternal Grandmother   . Heart disease Maternal Grandfather   . Heart disease Father   . Hyperlipidemia Mother     Patient Active Problem List   Diagnosis Date Noted  . Hemorrhoids 08/30/2012    Past Medical History  Diagnosis Date  . Hyperlipidemia   . Hypertension   . Anemia   . Migraine headache with aura 2012  . Hemorrhoids   . OCD (obsessive compulsive disorder)     Past Surgical History  Procedure Laterality Date  . Tonsillectomy    . Dilatation & currettage/hysteroscopy with resectocope  12/17/08    of polyps  . Abdominal hysterectomy Right 11/25/09    [facility]- for menorrhagia- path simple hyperplasia fibroids 104 g  . Dilation and curettage of uterus    . Hysteroscopy  12/17/08    resection polyps    Allergies: Erythromycin  Current Outpatient Prescriptions  Medication Sig Dispense Refill  . Aspirin-Acetaminophen-Caffeine (GOODY HEADACHE PO) Take 1 Package by mouth daily.      . cetirizine (ZYRTEC) 10 MG tablet Take 10 mg by mouth daily.      . cholecalciferol (VITAMIN D) 1000 UNITS tablet Take 5,000 Units by mouth daily.      . citalopram (CELEXA) 40 MG tablet Take 40 mg by mouth daily.      . Hydrocortisone Ace-Pramoxine (PROCTOFOAM HC RE) Place rectally.      Marland Kitchen lisinopril (PRINIVIL,ZESTRIL) 10 MG tablet Take 20 mg by mouth daily.       . Misc Natural Products (OSTEO BI-FLEX JOINT SHIELD PO) Take by mouth daily.       .  Multiple Vitamins-Minerals (MULTIVITAMIN WITH MINERALS) tablet Take 1 tablet by mouth daily.      . clindamycin (CLEOCIN) 150 MG capsule       . hydrochlorothiazide (MICROZIDE) 12.5 MG capsule        No current facility-administered medications for this visit.    ROS: Pertinent items are noted in HPI.  Social Hx:  Married, no children, works in Airline pilot  Exam:    BP 132/70  Ht 5\' 5"  (1.651 m)  Wt 142 lb (64.411 kg)  BMI 23.63 kg/m2  Height stable and weight up 2 pounds from last year Wt Readings from Last 3 Encounters:  01/05/13 142 lb (64.411 kg)  09/08/12 144 lb (65.318 kg)  08/30/12 147 lb (66.679 kg)     Ht Readings from Last 3 Encounters:  01/05/13 5\' 5"  (1.651 m)  09/08/12 5\' 5"  (1.651 m)  08/30/12 5\' 5"  (1.651 m)    General appearance: alert, cooperative and appears stated age Head: Normocephalic, without obvious abnormality, atraumatic Neck: no adenopathy, supple, symmetrical, trachea midline and thyroid not enlarged, symmetric, no tenderness/mass/nodules Lungs: clear to auscultation bilaterally Breasts: Inspection negative, No nipple retraction  or dimpling, No nipple discharge or bleeding, No axillary or supraclavicular adenopathy, Normal to palpation without dominant masses Heart: regular rate and rhythm Abdomen: soft, non-tender; bowel sounds normal; no masses,  no organomegaly Extremities: extremities normal, atraumatic, no cyanosis or edema Skin: Skin color, texture, turgor normal. No rashes or lesions Lymph nodes: Cervical, supraclavicular, and axillary nodes normal. No abnormal inguinal nodes palpated Neurologic: Grossly normal   Pelvic: External genitalia:  no lesions              Urethra:  normal appearing urethra with no masses, tenderness or lesions              Bartholins and Skenes: normal                 Vagina: normal appearing vagina with normal color and discharge, no lesions              Cervix: absent              Pap taken: no        Bimanual  Exam:  Uterus:  absent                                      Adnexa: normal adnexa in size, nontender and no masses                                      Rectovaginal: Confirms                                      Anus:  normal sphincter tone, no lesions  A: normal gyn exam     S/p R-TLH for fibroids, menorrhagia in 2010     HTN, elevated chol and intolerant of rx (leg pain)     P: mammogram counseled on breast self exam, mammography screening, adequate intake of calcium and vitamin D, diet and exercise return annually or prn  TDap today    An After Visit Summary was printed and given to the patient.

## 2013-05-09 ENCOUNTER — Telehealth: Payer: Self-pay | Admitting: *Deleted

## 2013-05-09 MED ORDER — HYDROCORTISONE 2.5 % RE CREA: TOPICAL_CREAM | Freq: Two times a day (BID) | RECTAL | Status: DC

## 2013-05-09 NOTE — Telephone Encounter (Signed)
Fax From: [facility] for Proctofoam [facility] Last Refilled:  08/09/12 with refills to 5/14 Aex: 01/05/13 with Dr. Tresa Res  Pharmacy states that rx is on back order and if there is something else the patient can change to?  Please advise.

## 2013-05-09 NOTE — Telephone Encounter (Signed)
Thanks!m

## 2013-05-09 NOTE — Telephone Encounter (Signed)
Did rx for anusol HC 2%--a hemorrhoid cream that works well for tenderness, swelling, or itching.  I already did rx to her pharmacy with 1 RFs.

## 2013-05-25 ENCOUNTER — Other Ambulatory Visit: Payer: Self-pay | Admitting: *Deleted

## 2013-05-25 MED ORDER — HYDROCORTISONE ACE-PRAMOXINE 1-1 % RE FOAM: Freq: Three times a day (TID) | RECTAL | Status: DC

## 2013-05-25 NOTE — Telephone Encounter (Signed)
Pt had her Annual Exam 12/2012 did not get a refill on Proctofoam, but was listed on medication list.

## 2013-07-30 ENCOUNTER — Telehealth: Payer: Self-pay | Admitting: Gynecology

## 2013-07-30 NOTE — Telephone Encounter (Signed)
Patient calling stating she has "clumpy discharge and yeast infection symptoms." Patient wants an appointment with Dr. Farrel Gobble sometime this week please?

## 2013-07-30 NOTE — Telephone Encounter (Signed)
Spoke with pt who has been having yeast symptoms for about 2 months, and it seems to be getting worse. Pt took an OTC test, which came back "abnormal." Pt has not tried anything OTC for treatment. Pt OK with seeing DL, as no appts available for TL. Scheduled appt tomorrow 07-31-13 at 3:15 per pt request.

## 2013-07-30 NOTE — Telephone Encounter (Signed)
LMTCB  aa 

## 2013-07-31 ENCOUNTER — Ambulatory Visit (INDEPENDENT_AMBULATORY_CARE_PROVIDER_SITE_OTHER): Payer: 59 | Admitting: Certified Nurse Midwife

## 2013-07-31 ENCOUNTER — Encounter: Payer: Self-pay | Admitting: Certified Nurse Midwife

## 2013-07-31 VITALS — BP 120/62 | HR 72 | Resp 16 | Ht 65.0 in | Wt 136.0 lb

## 2013-07-31 DIAGNOSIS — N760 Acute vaginitis: Secondary | ICD-10-CM

## 2013-07-31 DIAGNOSIS — B3731 Acute candidiasis of vulva and vagina: Secondary | ICD-10-CM

## 2013-07-31 DIAGNOSIS — B373 Candidiasis of vulva and vagina: Secondary | ICD-10-CM

## 2013-07-31 MED ORDER — METRONIDAZOLE 0.75 % VA GEL: 1.0000 | Freq: Two times a day (BID) | VAGINAL | Status: DC

## 2013-07-31 MED ORDER — TERCONAZOLE 0.4 % VA CREA: 1.0000 | TOPICAL_CREAM | Freq: Every day | VAGINAL | Status: DC

## 2013-07-31 NOTE — Progress Notes (Signed)
47 y.o.MarriedCaucasian female G0P0000 with a 2 month(s) history of the following:discharge described as curd-like and malodorous Sexually active: yes Last sexual activity:14days ago. Pt also reports the following associated symptoms: none Patient has not tried over the counter treatment.  Denies new personal products or other issues.  O:Healthy female WDWN Affect normal, orientation x 3    Exam:  ZOX:WRUEAV, Bartholin's, Urethra, Skene's normal                WUJ:[id number]: copious, white, watery and malodorous, pH 5.5, wet prep done                Cx:  absent                Uterus:absent                Adnexa: normal adnexa and no mass, fullness, tenderness  Wet Prep shows:clue cells and yeast   A: bacterial vaginosis Yeast vaginitis  P: Reviewed findings and need for Rx Rx Terazol 7 with instructions see order Rx Metrogel see order [id number] local care discussed.   Rv prn

## 2013-08-01 NOTE — Telephone Encounter (Signed)
Detailed message left to patient. Can start with using cream at night. Can start Metrogel bid when rx available. Advised to call back if has any further questions.

## 2013-08-01 NOTE — Telephone Encounter (Signed)
Patients pharmacy only has the cream and not the gel she was prescribed 07/31/13. The pharmacy should have the gel in stock tonight. Patient does not remember the names of the prescriptions. Patient wants to know if she can start with the cream instead of the gel? Okay to leave message per patient.

## 2013-08-06 NOTE — Progress Notes (Signed)
Note reviewed, agree with plan.  Jacksyn Beeks, MD  

## 2013-08-08 ENCOUNTER — Other Ambulatory Visit: Payer: Self-pay

## 2013-08-08 DIAGNOSIS — Z1231 Encounter for screening mammogram for malignant neoplasm of breast: Secondary | ICD-10-CM

## 2013-09-05 ENCOUNTER — Ambulatory Visit (INDEPENDENT_AMBULATORY_CARE_PROVIDER_SITE_OTHER): Payer: BC Managed Care – PPO | Admitting: Certified Nurse Midwife

## 2013-09-05 VITALS — BP 124/80 | HR 72 | Resp 16 | Ht 65.0 in | Wt 135.0 lb

## 2013-09-05 DIAGNOSIS — B3731 Acute candidiasis of vulva and vagina: Secondary | ICD-10-CM

## 2013-09-05 DIAGNOSIS — B373 Candidiasis of vulva and vagina: Secondary | ICD-10-CM

## 2013-09-05 MED ORDER — TERCONAZOLE 0.4 % VA CREA: 1.0000 | TOPICAL_CREAM | Freq: Every day | VAGINAL | Status: DC

## 2013-09-05 NOTE — Patient Instructions (Signed)
Monilial Vaginitis  Vaginitis in a soreness, swelling and redness (inflammation) of the vagina and vulva. Monilial vaginitis is not a sexually transmitted infection.  CAUSES   Yeast vaginitis is caused by yeast (candida) that is normally found in your vagina. With a yeast infection, the candida has overgrown in number to a point that upsets the chemical balance.  SYMPTOMS   · White, thick vaginal discharge.  · Swelling, itching, redness and irritation of the vagina and possibly the lips of the vagina (vulva).  · Burning or painful urination.  · Painful intercourse.  DIAGNOSIS   Things that may contribute to monilial vaginitis are:  · Postmenopausal and virginal states.  · Pregnancy.  · Infections.  · Being tired, sick or stressed, especially if you had monilial vaginitis in the past.  · Diabetes. Good control will help lower the chance.  · Birth control pills.  · Tight fitting garments.  · Using bubble bath, feminine sprays, douches or deodorant tampons.  · Taking certain medications that kill germs (antibiotics).  · Sporadic recurrence can occur if you become ill.  TREATMENT   Your caregiver will give you medication.  · There are several kinds of anti monilial vaginal creams and suppositories specific for monilial vaginitis. For recurrent yeast infections, use a suppository or cream in the vagina 2 times a week, or as directed.  · Anti-monilial or steroid cream for the itching or irritation of the vulva may also be used. Get your caregiver's permission.  · Painting the vagina with methylene blue solution may help if the monilial cream does not work.  · Eating yogurt may help prevent monilial vaginitis.  HOME CARE INSTRUCTIONS   · Finish all medication as prescribed.  · Do not have sex until treatment is completed or after your caregiver tells you it is okay.  · Take warm sitz baths.  · Do not douche.  · Do not use tampons, especially scented ones.  · Wear cotton underwear.  · Avoid tight pants and panty  hose.  · Tell your sexual partner that you have a yeast infection. They should go to their caregiver if they have symptoms such as mild rash or itching.  · Your sexual partner should be treated as well if your infection is difficult to eliminate.  · Practice safer sex. Use condoms.  · Some vaginal medications cause latex condoms to fail. Vaginal medications that harm condoms are:  · Cleocin cream.  · Butoconazole (Femstat®).  · Terconazole (Terazol®) vaginal suppository.  · Miconazole (Monistat®) (may be purchased over the counter).  SEEK MEDICAL CARE IF:   · You have a temperature by mouth above 102° F (38.9° C).  · The infection is getting worse after 2 days of treatment.  · The infection is not getting better after 3 days of treatment.  · You develop blisters in or around your vagina.  · You develop vaginal bleeding, and it is not your menstrual period.  · You have pain when you urinate.  · You develop intestinal problems.  · You have pain with sexual intercourse.  Document Released: 05/19/2005 Document Revised: 11/01/2011 Document Reviewed: 01/31/2009  ExitCare® Patient Information ©2014 [employer].

## 2013-09-05 NOTE — Progress Notes (Signed)
48 y.o.Married Caucasian female G0P0000 with a 7 day(s) history of the following:discharge described as white, curd-like and milky Sexually active: no. Patient in the process of separation. No STD screening needed.. Pt also reports the following associated symptoms: none Patient has not tried over the counter treatment. Patient denies any new personal products or detergent change. Patient treated for BV and yeast 07/31/13 with completion of medication and symptom free. Patient denies no change in diet or history of diabetes. O: Healthy female WDWN  Affect: normal, Orientation X 3    Exam:  FGH:[id number]'C, Urethra, Skene's normal, slight increase in pink of vulva, no scaling or exudate noted, no lesions noted  VEL:[id number]: white, curd-like and odorless, pH 4.0, wet prep done                Cx:  Absent      Uterus: absent                Adnexa: normal adnexa and no mass, fullness, tenderness  Wet Prep shows:yeast   CH:ENIDP vaginitis  P: Reviewed findings of yeast. Instructed to stop using dryer sheets which be leaving residue on underwear. Patient agrees Rx Terazol 7 see order(unable to prescribe Diflucan due to interaction with Citalopram dose) :Symptomatic local care discussed. Scientist, clinical (histocompatibility and immunogenetics) distributed.  Rv in 2 weeks if no change

## 2013-09-07 ENCOUNTER — Encounter: Payer: Self-pay | Admitting: Certified Nurse Midwife

## 2013-09-07 NOTE — Progress Notes (Signed)
Reviewed personally.  M. Suzanne Kasie Leccese, MD.  

## 2013-09-14 ENCOUNTER — Ambulatory Visit
Admission: RE | Admit: 2013-09-14 | Discharge: 2013-09-14 | Disposition: A | Discharge status: Home / Self Care | Payer: BC Managed Care – PPO | Source: Ambulatory Visit

## 2013-09-14 DIAGNOSIS — Z1231 Encounter for screening mammogram for malignant neoplasm of breast: Secondary | ICD-10-CM

## 2013-09-20 ENCOUNTER — Encounter: Payer: Self-pay | Admitting: Certified Nurse Midwife

## 2013-09-20 ENCOUNTER — Telehealth: Payer: Self-pay | Admitting: Certified Nurse Midwife

## 2013-09-20 ENCOUNTER — Ambulatory Visit (INDEPENDENT_AMBULATORY_CARE_PROVIDER_SITE_OTHER): Payer: BC Managed Care – PPO | Admitting: Certified Nurse Midwife

## 2013-09-20 VITALS — BP 122/77 | HR 72 | Resp 16 | Ht 65.0 in | Wt 134.0 lb

## 2013-09-20 DIAGNOSIS — B3731 Acute candidiasis of vulva and vagina: Secondary | ICD-10-CM

## 2013-09-20 DIAGNOSIS — B373 Candidiasis of vulva and vagina: Secondary | ICD-10-CM

## 2013-09-20 MED ORDER — NYSTATIN-TRIAMCINOLONE 100000-0.1 UNIT/GM-% EX CREA: 1.0000 "application " | TOPICAL_CREAM | Freq: Two times a day (BID) | CUTANEOUS | Status: DC

## 2013-09-20 NOTE — Progress Notes (Signed)
48 y.o. Married Caucasian female G0P0000 here for follow up of yeast vaginitis treated with Terazol 7 initiated on 09/05/13. Completed all medication as directed.  Denies any symptoms of increase vaginal discharge or odor. Patient continues with some external itching on vulva area. Has stopped using dryer sheets which has helped. Having hemorrhoid flare at present has Proctofoam for use. No other issues. Has not been sexually active, and partner has no symptoms per patient.   O: Healthy WD,WN female Affect: normal, orientation x 3   Pelvic exam:EXTERNAL GENITALIA: Slight redness on vulva bilateral, no scaling, but slight exudate wet prep taken VAGINA: discharge white normal appearance, Wet Prep/KOH  taken, pH 4.0  CERVIX: no lesions or cervical motion tenderness and absent UTERUS: absent ADNEXA: no masses palpable, non tender RECTUM: hemorrhoid - external noted, non thrombosed Wet Prep: negative vaginal, positive for yeast vulva  A:Yeast vaginitis resolved  Yeast vulvitis still present Hemorrhoid under treatment    P: Discussed findings of vaginal yeast resolved and vulva still present. Aveeno sitz bath for comfort. Rx Mycolog cream see order Use medication as indicated for hemorrhoid, if not resolving follow up with MD.    RV prn

## 2013-09-20 NOTE — Patient Instructions (Signed)
Yeast Infection of the Skin Some yeast on the skin is normal, but sometimes it causes an infection. If you have a yeast infection, it shows up as white or light brown patches on brown skin. You can see it better in the summer on tan skin. It causes light-colored holes in your suntan. It can happen on any area of the body. This cannot be passed from person to person. HOME CARE  Scrub your skin daily with a dandruff shampoo. Your rash may take a couple weeks to get well.  Do not scratch or itch the rash. GET HELP RIGHT AWAY IF:   You get another infection from scratching. The skin may get warm, red, and may ooze fluid.  The infection does not seem to be getting better. MAKE SURE YOU:  Understand these instructions.  Will watch your condition.  Will get help right away if you are not doing well or get worse. Document Released: 07/22/2008 Document Revised: 11/01/2011 Document Reviewed: 07/22/2008 ExitCare Patient Information 2014 [employer].  

## 2013-09-21 ENCOUNTER — Telehealth: Payer: Self-pay | Admitting: Certified Nurse Midwife

## 2013-09-21 NOTE — Telephone Encounter (Signed)
Pt is calling to get a referral sent  so she can see Dr. Earlean Shawl. She was seen on 09/20/2013 with DL and was told to call Dr. Liliane Channel office for an appointment.

## 2013-09-24 NOTE — Progress Notes (Signed)
Reviewed personally.  M. Suzanne Kaelan Amble, MD.  

## 2013-09-25 ENCOUNTER — Other Ambulatory Visit: Payer: Self-pay | Admitting: Certified Nurse Midwife

## 2013-09-25 DIAGNOSIS — K649 Unspecified hemorrhoids: Secondary | ICD-10-CM

## 2013-09-25 NOTE — Telephone Encounter (Signed)
On your desk

## 2013-09-25 NOTE — Telephone Encounter (Signed)
Pt reports that a later pm appt with Dr. Earlean Shawl would be best for her.

## 2013-09-25 NOTE — Telephone Encounter (Signed)
Pt called back and reports she would like to see Dr. Earlean Shawl about her hemorrhoids. Advised DL will put in the order for a referral, and we will call for an appt. Pt appreciative.

## 2013-09-25 NOTE — Telephone Encounter (Signed)
LMTCB   Debbie,  Did you want this pt to see Dr. Earlean Shawl about hemorrhoids? OK to send referral?

## 2013-10-02 ENCOUNTER — Telehealth: Payer: Self-pay | Admitting: Certified Nurse Midwife

## 2013-10-02 NOTE — Telephone Encounter (Signed)
Patient is scheduled w/Dr. Collene Mares 02.12.2015 @ 1330. Dr Youlanda Mighty # [phone] if patient needs to reschedule.

## 2013-10-05 NOTE — Telephone Encounter (Signed)
See phone note, patient is scheduled with Dr Collene Mares. Encounter closed.

## 2013-11-05 ENCOUNTER — Telehealth: Payer: Self-pay

## 2013-11-05 ENCOUNTER — Other Ambulatory Visit: Payer: Self-pay | Admitting: Gynecology

## 2013-11-05 NOTE — Telephone Encounter (Signed)
LMOVM to call regarding refill on Proctofoam.  Did pt. Keep referral appointment to Dr. Collene Mares for hemorrhoids?  If so, probably needs to Get medication from GI.

## 2013-11-05 NOTE — Telephone Encounter (Signed)
eScribe request from [facility] for refill on Proctofoam-HC Last filled - 08-14-13 Last AEX - 01-05-13 Next AEX - 01-11-14 Called pt. And asked if she saw Dr. Collene Mares regarding hemorrhoids.  She states saw Dr. Collene Mares but was Told she couldn't help her with hemorrhoids, only a general surgeon could.  Dr. Collene Mares did not advise Her to see surgeon.  Pt. Requesting refill on Proctofoam from our office.

## 2013-11-05 NOTE — Telephone Encounter (Signed)
See note in refill encounter.  Patient did see Dr. Collene Mares but she couldn't do much for hemorrhoids and suggested She see surgeon.  Sent not to D. Hollice Espy requesting refill on Proctofoam.

## 2014-01-11 ENCOUNTER — Ambulatory Visit: Payer: 59 | Admitting: Obstetrics and Gynecology

## 2014-01-11 ENCOUNTER — Ambulatory Visit: Payer: BC Managed Care – PPO | Admitting: Gynecology

## 2014-01-30 ENCOUNTER — Ambulatory Visit (INDEPENDENT_AMBULATORY_CARE_PROVIDER_SITE_OTHER): Payer: BC Managed Care – PPO | Admitting: Gynecology

## 2014-01-30 ENCOUNTER — Encounter: Payer: Self-pay | Admitting: Gynecology

## 2014-01-30 VITALS — BP 120/76 | HR 80 | Resp 16 | Ht 65.0 in | Wt 136.0 lb

## 2014-01-30 DIAGNOSIS — N6320 Unspecified lump in the left breast, unspecified quadrant: Secondary | ICD-10-CM

## 2014-01-30 DIAGNOSIS — N63 Unspecified lump in unspecified breast: Secondary | ICD-10-CM

## 2014-01-30 DIAGNOSIS — K649 Unspecified hemorrhoids: Secondary | ICD-10-CM

## 2014-01-30 DIAGNOSIS — G43109 Migraine with aura, not intractable, without status migrainosus: Secondary | ICD-10-CM | POA: Insufficient documentation

## 2014-01-30 DIAGNOSIS — Z01419 Encounter for gynecological examination (general) (routine) without abnormal findings: Secondary | ICD-10-CM

## 2014-01-30 DIAGNOSIS — I10 Essential (primary) hypertension: Secondary | ICD-10-CM | POA: Insufficient documentation

## 2014-01-30 DIAGNOSIS — Z0000 Encounter for general adult medical examination without abnormal findings: Secondary | ICD-10-CM

## 2014-01-30 DIAGNOSIS — F429 Obsessive-compulsive disorder, unspecified: Secondary | ICD-10-CM | POA: Insufficient documentation

## 2014-01-30 LAB — POCT URINALYSIS DIPSTICK
Bilirubin, UA: NEGATIVE
Glucose, UA: NEGATIVE
Ketones, UA: NEGATIVE
Leukocytes, UA: NEGATIVE
NITRITE UA: NEGATIVE
PH UA: 7
Protein, UA: NEGATIVE
UROBILINOGEN UA: NEGATIVE

## 2014-01-30 MED ORDER — HYDROCORTISONE ACE-PRAMOXINE 1-1 % RE FOAM: Freq: Three times a day (TID) | RECTAL | Status: DC | PRN

## 2014-01-30 NOTE — Progress Notes (Signed)
48 y.o. Married (in the process of divorce) Caucasian female   G0P0000 here for annual exam. Pt is currently sexually active.  Pt reports external hemorrhoids.  No menopausal symptoms  Patient's last menstrual period was 11/25/2009.          Sexually active: yes  The current method of family planning is status post hysterectomy.    Exercising: yes  Walking daily Last pap: 11/2008 neg Alcohol: yes, 5 drinks weekly Tobacco: No BSE: No MMG: BIRADS1 Labs: PCP , UA: RBC=trace    Health Maintenance  Topic Date Due  . Pap Smear  09/01/1982  . Influenza Vaccine  03/23/2014  . Tetanus/tdap  01/06/2023    Family History  Problem Relation Age of Onset  . Heart disease Maternal Grandmother   . Heart disease Maternal Grandfather   . Heart disease Father   . Hyperlipidemia Mother     Patient Active Problem List   Diagnosis Date Noted  . Hemorrhoids 08/30/2012    Past Medical History  Diagnosis Date  . Hyperlipidemia   . Hypertension   . Anemia   . Migraine headache with aura 2012  . Hemorrhoids   . OCD (obsessive compulsive disorder)   . Fibroid   . Menorrhagia     Past Surgical History  Procedure Laterality Date  . Tonsillectomy    . Dilatation & currettage/hysteroscopy with resectocope  12/17/08    of polyps  . Abdominal hysterectomy Right 11/25/09    TLH- for menorrhagia- path simple hyperplasia fibroids 104 g  . Dilation and curettage of uterus    . Hysteroscopy  12/17/08    resection polyps    Allergies: Erythromycin  Current Outpatient Prescriptions  Medication Sig Dispense Refill  . amoxicillin-clavulanate (AUGMENTIN) 875-125 MG per tablet       . Aspirin-Acetaminophen-Caffeine (GOODY HEADACHE PO) Take 1 Package by mouth daily.      Marland Kitchen azelastine (OPTIVAR) 0.05 % ophthalmic solution       . cetirizine (ZYRTEC) 10 MG tablet Take 10 mg by mouth daily.      . Cholecalciferol (VITAMIN D PO) Take 5,000 Int'l Units by mouth daily.      . citalopram (CELEXA) 40 MG  tablet Take 40 mg by mouth daily.      . fluticasone (FLONASE) 50 MCG/ACT nasal spray       . hydrocortisone (ANUSOL-HC) 2.5 % rectal cream Place rectally 2 (two) times daily. Use PRN rectal pain/irritation/hemorrhoids.  30 g  1  . lisinopril-hydrochlorothiazide (PRINZIDE,ZESTORETIC) 20-12.5 MG per tablet Take 1 tablet by mouth daily.      . Misc Natural Products (OSTEO BI-FLEX JOINT SHIELD PO) Take by mouth daily.       . Multiple Vitamins-Minerals (MULTIVITAMIN WITH MINERALS) tablet Take 1 tablet by mouth daily.      . clindamycin (CLEOCIN) 150 MG capsule       . PROCTOFOAM HC rectal foam PLACE RECTALLY THREE TIMES DAILY  10 g  3   No current facility-administered medications for this visit.    ROS: Pertinent items are noted in HPI.  Exam:    BP 120/76  Pulse 80  Resp 16  Ht 5\' 5"  (1.651 m)  Wt 136 lb (61.689 kg)  BMI 22.63 kg/m2  LMP 11/25/2009 Weight change: @WEIGHTCHANGE @ Last 3 height recordings:  Ht Readings from Last 3 Encounters:  01/30/14 5\' 5"  (1.651 m)  09/20/13 5\' 5"  (1.651 m)  09/05/13 5\' 5"  (1.651 m)   General appearance: alert, cooperative and appears stated age  Head: Normocephalic, without obvious abnormality, atraumatic Neck: no adenopathy, no carotid bruit, no JVD, supple, symmetrical, trachea midline and thyroid not enlarged, symmetric, no tenderness/mass/nodules Lungs: clear to auscultation bilaterally Breasts: normal appearance, no masses or tenderness, left breast with mobile cystic mass at 7 o'clock Heart: regular rate and rhythm, S1, S2 normal, no murmur, click, rub or gallop Abdomen: soft, non-tender; bowel sounds normal; no masses,  no organomegaly Extremities: extremities normal, atraumatic, no cyanosis or edema Skin: Skin color, texture, turgor normal. No rashes or lesions Lymph nodes: Cervical, supraclavicular, and axillary nodes normal. no inguinal nodes palpated Neurologic: Grossly normal   Pelvic: External genitalia:  no lesions               Urethra: normal appearing urethra with no masses, tenderness or lesions              Bartholins and Skenes: Bartholin's, Urethra, Skene's normal                 Vagina: normal appearing vagina with normal color and discharge, no lesions              Cervix: absent              Pap taken: no        Bimanual Exam:  Uterus:  absent                                      Adnexa:    no masses                                      Rectovaginal: Confirms                                      Anus:  hemorrhoids  A: well woman Left breast mass- hemorrhoids    P: mammogram-diagnostic left with u/s Refill proctofoam counseled on breast self exam, mammography screening, adequate intake of calcium and vitamin D, diet and exercise return annually or prn   An After Visit Summary was printed and given to the patient.

## 2014-01-31 ENCOUNTER — Telehealth: Payer: Self-pay | Admitting: Gynecology

## 2014-01-31 DIAGNOSIS — N6320 Unspecified lump in the left breast, unspecified quadrant: Secondary | ICD-10-CM

## 2014-01-31 NOTE — Telephone Encounter (Signed)
Message left to return call to [place] at [phone].   Message from Dr. Charlies Constable to order L Breast dx mammogam with L Breast US.  Orders placed.  First available appointment at [facility] obtained will advise patient as scheduled for 02/11/14 at 1115.  Mammogram hold placed.

## 2014-02-04 NOTE — Telephone Encounter (Signed)
Spoke with patient. She is agreeable to appointment as scheduled for 02/11/14 at 1115. She was given appointment information.  Routing to provider for final review. Patient agreeable to disposition. Will close encounter

## 2014-02-11 ENCOUNTER — Ambulatory Visit
Admission: RE | Admit: 2014-02-11 | Discharge: 2014-02-11 | Disposition: A | Discharge status: Home / Self Care | Payer: BC Managed Care – PPO | Source: Ambulatory Visit | Attending: Gynecology | Admitting: Gynecology

## 2014-02-11 DIAGNOSIS — N6320 Unspecified lump in the left breast, unspecified quadrant: Secondary | ICD-10-CM

## 2014-07-01 ENCOUNTER — Telehealth: Payer: Self-pay | Admitting: *Deleted

## 2014-07-01 NOTE — Telephone Encounter (Signed)
Patient in 04 recall for Bilateral Screening Mammogram from 02/11/14.  Recommendation per report is for January 2016, per sally okay to update recall for January 2016.  Old recall completed new recall in for 09/06/14  Routed to provider for review, encounter closed.

## 2014-09-25 ENCOUNTER — Other Ambulatory Visit: Payer: Self-pay

## 2014-09-25 DIAGNOSIS — Z1231 Encounter for screening mammogram for malignant neoplasm of breast: Secondary | ICD-10-CM

## 2014-10-16 ENCOUNTER — Ambulatory Visit
Admission: RE | Admit: 2014-10-16 | Discharge: 2014-10-16 | Disposition: A | Discharge status: Home / Self Care | Payer: BLUE CROSS/BLUE SHIELD | Source: Ambulatory Visit

## 2014-10-16 DIAGNOSIS — Z1231 Encounter for screening mammogram for malignant neoplasm of breast: Secondary | ICD-10-CM

## 2015-02-07 ENCOUNTER — Ambulatory Visit: Payer: BC Managed Care – PPO | Admitting: Gynecology

## 2015-02-21 ENCOUNTER — Encounter: Payer: Self-pay | Admitting: Obstetrics and Gynecology

## 2015-02-21 ENCOUNTER — Ambulatory Visit (INDEPENDENT_AMBULATORY_CARE_PROVIDER_SITE_OTHER): Payer: BLUE CROSS/BLUE SHIELD | Admitting: Obstetrics and Gynecology

## 2015-02-21 VITALS — BP 118/74 | HR 80 | Ht 65.0 in | Wt 142.6 lb

## 2015-02-21 DIAGNOSIS — R140 Abdominal distension (gaseous): Secondary | ICD-10-CM | POA: Diagnosis not present

## 2015-02-21 DIAGNOSIS — Z01419 Encounter for gynecological examination (general) (routine) without abnormal findings: Secondary | ICD-10-CM

## 2015-02-21 DIAGNOSIS — R32 Unspecified urinary incontinence: Secondary | ICD-10-CM

## 2015-02-21 DIAGNOSIS — Z0000 Encounter for general adult medical examination without abnormal findings: Secondary | ICD-10-CM

## 2015-02-21 DIAGNOSIS — Z1211 Encounter for screening for malignant neoplasm of colon: Secondary | ICD-10-CM | POA: Diagnosis not present

## 2015-02-21 LAB — POCT URINALYSIS DIPSTICK
LEUKOCYTES UA: NEGATIVE
Urobilinogen, UA: NEGATIVE
pH, UA: 5

## 2015-02-21 NOTE — Progress Notes (Signed)
49 y.o. G7P0000 Divorced Caucasian female here for annual exam.    Had respiratory infections and yeast infections and thrush last year.   Some urinary leakage during the last few weeks. No leak with cough, laugh, sneeze.  No dysuria.  Leaks for no reason.  2 caffeines per day.  Some sodas per day.  Some gas and bloating.  Felt pressure in her chest from the bloating.  Took Tums and not getting much relief. BMs not every day.  Taking fiber.  Feels hot occasionally.  Hot flashes are manageable.   Trying to lose weight.   PCP:   Darcus Austin, MD  Patient's last menstrual period was 11/25/2009.          Sexually active: Yes.   Female partner.  The current method of family planning is status post hysterectomy.   Still has tubes and ovaries.  Exercising: Yes.    weights, walking twice weekly/every day Smoker:  no  Health Maintenance: Pap: 11/25/2008  Neg hr hpv History of abnormal Pap:  Yes, years ago had f/u pap normal eversince MMG:  10/18/14 bi-rads 1: neg Colonoscopy:  Never had one BMD:   Never had one  Result  n/a TDaP:  01/05/2013 Screening Labs:  Hb today: PCP, Urine today: Negative   reports that she has never smoked. She has never used smokeless tobacco. She reports that she drinks about 3.0 oz of alcohol per week. She reports that she does not use illicit drugs.  Past Medical History  Diagnosis Date  . Hyperlipidemia   . Hypertension   . Anemia   . Migraine headache with aura 2012  . Hemorrhoids   . OCD (obsessive compulsive disorder)   . Fibroid   . Menorrhagia     Past Surgical History  Procedure Laterality Date  . Tonsillectomy    . Dilatation & currettage/hysteroscopy with resectocope  12/17/08    of polyps  . Dilation and curettage of uterus    . Hysteroscopy  12/17/08    resection polyps  . Robotic assisted total hysterectomy  11/25/09    simple hyperplasia, fibroids    Current Outpatient Prescriptions  Medication Sig Dispense Refill  .  Aspirin-Acetaminophen-Caffeine (GOODY HEADACHE PO) Take 1 Package by mouth daily.    . cetirizine (ZYRTEC) 10 MG tablet Take 10 mg by mouth daily.    . citalopram (CELEXA) 40 MG tablet Take 40 mg by mouth daily.    . clindamycin (CLEOCIN) 150 MG capsule     . fluticasone (FLONASE) 50 MCG/ACT nasal spray     . lisinopril-hydrochlorothiazide (PRINZIDE,ZESTORETIC) 20-12.5 MG per tablet Take 1 tablet by mouth daily.    . Misc Natural Products (OSTEO BI-FLEX JOINT SHIELD PO) Take by mouth daily.     . Multiple Vitamins-Minerals (MULTIVITAMIN WITH MINERALS) tablet Take 1 tablet by mouth daily.    Marland Kitchen PROCTOFOAM HC rectal foam PLACE RECTALLY THREE TIMES DAILY 10 g 3  . amoxicillin-clavulanate (AUGMENTIN) 875-125 MG per tablet     . azelastine (OPTIVAR) 0.05 % ophthalmic solution     . Cholecalciferol (VITAMIN D PO) Take 5,000 Int'l Units by mouth daily.    . hydrocortisone (ANUSOL-HC) 2.5 % rectal cream Place rectally 2 (two) times daily. Use PRN rectal pain/irritation/hemorrhoids. (Patient not taking: Reported on 02/21/2015) 30 g 1   No current facility-administered medications for this visit.    Family History  Problem Relation Age of Onset  . Heart disease Maternal Grandmother   . Heart disease Maternal Grandfather   .  Heart disease Father   . Hyperlipidemia Mother     ROS:  Pertinent items are noted in HPI.  Otherwise, a comprehensive ROS was negative.  Exam:   BP 118/74 mmHg  Pulse 80  Ht 5\' 5"  (1.651 m)  Wt 142 lb 9.6 oz (64.683 kg)  BMI 23.73 kg/m2  LMP 11/25/2009    General appearance: alert, cooperative and appears stated age Head: Normocephalic, without obvious abnormality, atraumatic Neck: no adenopathy, supple, symmetrical, trachea midline and thyroid normal to inspection and palpation Lungs: clear to auscultation bilaterally Breasts: normal appearance, no masses or tenderness, Inspection negative, No nipple retraction or dimpling, No nipple discharge or bleeding, No axillary  or supraclavicular adenopathy Heart: regular rate and rhythm Abdomen: soft, non-tender; bowel sounds normal; no masses,  no organomegaly Extremities: extremities normal, atraumatic, no cyanosis or edema Skin: Skin color, texture, turgor normal. No rashes or lesions Lymph nodes: Cervical, supraclavicular, and axillary nodes normal. No abnormal inguinal nodes palpated Neurologic: Grossly normal  Pelvic: External genitalia:  no lesions              Urethra:  normal appearing urethra with no masses, tenderness or lesions              Bartholins and Skenes: normal                 Vagina: normal appearing vagina with normal color and discharge, no lesions              Cervix: absent              Pap taken: No. Bimanual Exam:  Uterus:  uterus absent              Adnexa: normal adnexa and no mass, fullness, tenderness              Rectovaginal: Yes.  .  Confirms.              Anus:  normal sphincter tone, no lesions  Chaperone was present for exam.  Assessment:   Well woman visit with normal exam. Perimenopausal female.  Urinary incontinence.  Abdominal bloating.   Plan: Yearly mammogram recommended after age 72.  Recommended self breast exam.  Pap and HR HPV as above. Discussed Calcium, Vitamin D, regular exercise program including cardiovascular and weight bearing exercise. Labs performed.  No..  Labs with PCP. Refills given on medications.  No..   Reviewed Kegels and discussed bladder irritants.  Referral to gastroenterology for colonoscopy and abdominal bloating.  Try probiotics or daily yogurt.  Follow up annually and prn.   Wellness form signed for work.   After visit summary provided.

## 2015-02-21 NOTE — Patient Instructions (Signed)

## 2015-03-04 ENCOUNTER — Telehealth: Payer: Self-pay | Admitting: Obstetrics and Gynecology

## 2015-03-04 NOTE — Telephone Encounter (Signed)
Patient calling to check on the status of her referral for a colonoscopy.

## 2015-04-04 NOTE — Telephone Encounter (Signed)
Okay to close encounter or is further follow up needed? °

## 2015-04-29 ENCOUNTER — Other Ambulatory Visit: Payer: Self-pay | Admitting: Family Medicine

## 2015-04-29 DIAGNOSIS — R1084 Generalized abdominal pain: Secondary | ICD-10-CM

## 2015-05-06 ENCOUNTER — Ambulatory Visit
Admission: RE | Admit: 2015-05-06 | Discharge: 2015-05-06 | Disposition: A | Discharge status: Home / Self Care | Payer: BLUE CROSS/BLUE SHIELD | Source: Ambulatory Visit | Attending: Family Medicine | Admitting: Family Medicine

## 2015-05-06 DIAGNOSIS — R1084 Generalized abdominal pain: Secondary | ICD-10-CM

## 2015-09-29 ENCOUNTER — Other Ambulatory Visit: Payer: Self-pay

## 2015-09-29 DIAGNOSIS — Z1231 Encounter for screening mammogram for malignant neoplasm of breast: Secondary | ICD-10-CM

## 2015-10-20 ENCOUNTER — Ambulatory Visit
Admission: RE | Admit: 2015-10-20 | Discharge: 2015-10-20 | Disposition: A | Discharge status: Home / Self Care | Payer: BLUE CROSS/BLUE SHIELD | Source: Ambulatory Visit

## 2015-10-20 DIAGNOSIS — Z1231 Encounter for screening mammogram for malignant neoplasm of breast: Secondary | ICD-10-CM

## 2015-10-31 ENCOUNTER — Emergency Department (HOSPITAL_COMMUNITY): Payer: BLUE CROSS/BLUE SHIELD | Admitting: Anesthesiology

## 2015-10-31 ENCOUNTER — Observation Stay (HOSPITAL_COMMUNITY)
Admission: EM | Admit: 2015-10-31 | Discharge: 2015-11-03 | Disposition: A | Discharge status: Home / Self Care | Payer: BLUE CROSS/BLUE SHIELD | Attending: Surgery | Admitting: Surgery

## 2015-10-31 ENCOUNTER — Encounter (HOSPITAL_COMMUNITY): Payer: Self-pay | Admitting: Vascular Surgery

## 2015-10-31 ENCOUNTER — Encounter (HOSPITAL_COMMUNITY)
Admission: EM | Disposition: A | Discharge status: Home / Self Care | Payer: Self-pay | Source: Home / Self Care | Attending: Emergency Medicine

## 2015-10-31 ENCOUNTER — Ambulatory Visit
Admission: RE | Admit: 2015-10-31 | Discharge: 2015-10-31 | Disposition: A | Discharge status: Home / Self Care | Payer: BLUE CROSS/BLUE SHIELD | Source: Ambulatory Visit | Attending: Family Medicine | Admitting: Family Medicine

## 2015-10-31 ENCOUNTER — Other Ambulatory Visit: Payer: Self-pay | Admitting: Family Medicine

## 2015-10-31 DIAGNOSIS — I10 Essential (primary) hypertension: Secondary | ICD-10-CM | POA: Diagnosis not present

## 2015-10-31 DIAGNOSIS — K353 Acute appendicitis with localized peritonitis, without perforation or gangrene: Secondary | ICD-10-CM

## 2015-10-31 DIAGNOSIS — R10819 Abdominal tenderness, unspecified site: Secondary | ICD-10-CM

## 2015-10-31 DIAGNOSIS — R6883 Chills (without fever): Secondary | ICD-10-CM

## 2015-10-31 DIAGNOSIS — K3580 Unspecified acute appendicitis: Secondary | ICD-10-CM | POA: Diagnosis present

## 2015-10-31 DIAGNOSIS — E785 Hyperlipidemia, unspecified: Secondary | ICD-10-CM | POA: Insufficient documentation

## 2015-10-31 DIAGNOSIS — F429 Obsessive-compulsive disorder, unspecified: Secondary | ICD-10-CM | POA: Insufficient documentation

## 2015-10-31 DIAGNOSIS — R1031 Right lower quadrant pain: Secondary | ICD-10-CM

## 2015-10-31 HISTORY — PX: LAPAROSCOPIC APPENDECTOMY: SHX408

## 2015-10-31 LAB — CBC
HCT: 37.3 % (ref 36.0–46.0)
Hemoglobin: 12.8 g/dL (ref 12.0–15.0)
MCH: 30.2 pg (ref 26.0–34.0)
MCHC: 34.3 g/dL (ref 30.0–36.0)
MCV: 88 fL (ref 78.0–100.0)
PLATELETS: 291 10*3/uL (ref 150–400)
RBC: 4.24 MIL/uL (ref 3.87–5.11)
RDW: 12.8 % (ref 11.5–15.5)
WBC: 14 10*3/uL — ABNORMAL HIGH (ref 4.0–10.5)

## 2015-10-31 LAB — COMPREHENSIVE METABOLIC PANEL
ALBUMIN: 4 g/dL (ref 3.5–5.0)
ALK PHOS: 72 U/L (ref 38–126)
ALT: 12 U/L — AB (ref 14–54)
AST: 20 U/L (ref 15–41)
Anion gap: 11 (ref 5–15)
BILIRUBIN TOTAL: 0.8 mg/dL (ref 0.3–1.2)
BUN: 6 mg/dL (ref 6–20)
CALCIUM: 9.1 mg/dL (ref 8.9–10.3)
CO2: 27 mmol/L (ref 22–32)
CREATININE: 0.68 mg/dL (ref 0.44–1.00)
Chloride: 98 mmol/L — ABNORMAL LOW (ref 101–111)
GFR calc Af Amer: >60 mL/min (ref >60)
GFR calc non Af Amer: >60 mL/min (ref >60)
GLUCOSE: 97 mg/dL (ref 65–99)
Potassium: 3.7 mmol/L (ref 3.5–5.1)
SODIUM: 136 mmol/L (ref 135–145)
TOTAL PROTEIN: 7.2 g/dL (ref 6.5–8.1)

## 2015-10-31 LAB — URINALYSIS, ROUTINE W REFLEX MICROSCOPIC
BILIRUBIN URINE: NEGATIVE
Glucose, UA: NEGATIVE mg/dL
KETONES UR: NEGATIVE mg/dL
Leukocytes, UA: NEGATIVE
Nitrite: NEGATIVE
PROTEIN: NEGATIVE mg/dL
Specific Gravity, Urine: 1.005 (ref 1.005–1.030)
pH: 6.5 (ref 5.0–8.0)

## 2015-10-31 LAB — URINE MICROSCOPIC-ADD ON: Bacteria, UA: NONE SEEN

## 2015-10-31 SURGERY — APPENDECTOMY, LAPAROSCOPIC
Anesthesia: General | Site: Abdomen

## 2015-10-31 MED ORDER — DEXTROSE 5 % IV SOLN
2.0000 g | Freq: Once | INTRAVENOUS | Status: AC
  Administered 2015-10-31: 2 g via INTRAVENOUS
  Filled 2015-10-31: qty 2

## 2015-10-31 MED ORDER — SIMETHICONE 80 MG PO CHEW: 40.0000 mg | CHEWABLE_TABLET | Freq: Four times a day (QID) | ORAL | Status: DC | PRN

## 2015-10-31 MED ORDER — FENTANYL CITRATE (PF) 100 MCG/2ML IJ SOLN
INTRAMUSCULAR | Status: DC | PRN
  Administered 2015-10-31: 50 ug via INTRAVENOUS
  Administered 2015-10-31: 100 ug via INTRAVENOUS

## 2015-10-31 MED ORDER — BUPIVACAINE-EPINEPHRINE (PF) 0.5% -1:200000 IJ SOLN
INTRAMUSCULAR | Status: AC
  Filled 2015-10-31: qty 30

## 2015-10-31 MED ORDER — DIPHENHYDRAMINE HCL 12.5 MG/5ML PO ELIX: 12.5000 mg | ORAL_SOLUTION | Freq: Four times a day (QID) | ORAL | Status: DC | PRN

## 2015-10-31 MED ORDER — OXYCODONE HCL 5 MG/5ML PO SOLN: 5.0000 mg | Freq: Once | ORAL | Status: DC | PRN

## 2015-10-31 MED ORDER — PROPOFOL 10 MG/ML IV BOLUS
INTRAVENOUS | Status: DC | PRN
  Administered 2015-10-31: 150 mg via INTRAVENOUS

## 2015-10-31 MED ORDER — METRONIDAZOLE IN NACL 5-0.79 MG/ML-% IV SOLN
500.0000 mg | Freq: Once | INTRAVENOUS | Status: AC
  Administered 2015-10-31: 500 mg via INTRAVENOUS
  Filled 2015-10-31: qty 100

## 2015-10-31 MED ORDER — HYDROMORPHONE HCL 1 MG/ML IJ SOLN
1.0000 mg | INTRAMUSCULAR | Status: DC | PRN
  Administered 2015-11-01 – 2015-11-03 (×3): 1 mg via INTRAVENOUS
  Filled 2015-10-31 (×3): qty 1

## 2015-10-31 MED ORDER — SODIUM CHLORIDE 0.9 % IV SOLN
INTRAVENOUS | Status: DC
  Administered 2015-10-31: 20:00:00 via INTRAVENOUS

## 2015-10-31 MED ORDER — ACETAMINOPHEN 325 MG PO TABS
650.0000 mg | ORAL_TABLET | Freq: Four times a day (QID) | ORAL | Status: DC | PRN
  Administered 2015-11-02: 650 mg via ORAL
  Filled 2015-10-31: qty 2

## 2015-10-31 MED ORDER — KCL IN DEXTROSE-NACL 20-5-0.9 MEQ/L-%-% IV SOLN
INTRAVENOUS | Status: DC
  Administered 2015-10-31 – 2015-11-02 (×5): via INTRAVENOUS
  Administered 2015-11-03: 1 mL via INTRAVENOUS
  Filled 2015-10-31 (×9): qty 1000

## 2015-10-31 MED ORDER — OXYCODONE HCL 5 MG PO TABS
5.0000 mg | ORAL_TABLET | ORAL | Status: DC | PRN
  Administered 2015-10-31 – 2015-11-01 (×2): 10 mg via ORAL
  Filled 2015-10-31 (×2): qty 2
  Filled 2015-10-31: qty 1
  Filled 2015-10-31 (×2): qty 2

## 2015-10-31 MED ORDER — ZOLPIDEM TARTRATE 5 MG PO TABS: 5.0000 mg | ORAL_TABLET | Freq: Every evening | ORAL | Status: DC | PRN

## 2015-10-31 MED ORDER — ONDANSETRON HCL 4 MG/2ML IJ SOLN
INTRAMUSCULAR | Status: DC | PRN
  Administered 2015-10-31: 4 mg via INTRAVENOUS

## 2015-10-31 MED ORDER — ONDANSETRON HCL 4 MG/2ML IJ SOLN: 4.0000 mg | Freq: Four times a day (QID) | INTRAMUSCULAR | Status: DC | PRN

## 2015-10-31 MED ORDER — ROCURONIUM BROMIDE 50 MG/5ML IV SOLN
INTRAVENOUS | Status: AC
  Filled 2015-10-31: qty 1

## 2015-10-31 MED ORDER — LIDOCAINE HCL (CARDIAC) 20 MG/ML IV SOLN
INTRAVENOUS | Status: DC | PRN
  Administered 2015-10-31: 60 mg via INTRAVENOUS

## 2015-10-31 MED ORDER — ONDANSETRON 4 MG PO TBDP: 4.0000 mg | ORAL_TABLET | Freq: Four times a day (QID) | ORAL | Status: DC | PRN

## 2015-10-31 MED ORDER — HYDROMORPHONE HCL 1 MG/ML IJ SOLN
1.0000 mg | Freq: Once | INTRAMUSCULAR | Status: AC
  Administered 2015-10-31: 1 mg via INTRAVENOUS
  Filled 2015-10-31: qty 1

## 2015-10-31 MED ORDER — LACTATED RINGERS IV SOLN
INTRAVENOUS | Status: DC | PRN
  Administered 2015-10-31: 20:00:00 via INTRAVENOUS

## 2015-10-31 MED ORDER — ENOXAPARIN SODIUM 40 MG/0.4ML ~~LOC~~ SOLN
40.0000 mg | SUBCUTANEOUS | Status: DC
  Administered 2015-11-01 – 2015-11-03 (×3): 40 mg via SUBCUTANEOUS
  Filled 2015-10-31 (×3): qty 0.4

## 2015-10-31 MED ORDER — SODIUM CHLORIDE 0.9 % IR SOLN
Status: DC | PRN
  Administered 2015-10-31: 1000 mL

## 2015-10-31 MED ORDER — ONDANSETRON HCL 4 MG/2ML IJ SOLN
4.0000 mg | Freq: Four times a day (QID) | INTRAMUSCULAR | Status: DC | PRN
  Administered 2015-11-01 – 2015-11-02 (×3): 4 mg via INTRAVENOUS
  Filled 2015-10-31 (×4): qty 2

## 2015-10-31 MED ORDER — FENTANYL CITRATE (PF) 250 MCG/5ML IJ SOLN
INTRAMUSCULAR | Status: AC
  Filled 2015-10-31: qty 5

## 2015-10-31 MED ORDER — DEXAMETHASONE SODIUM PHOSPHATE 4 MG/ML IJ SOLN
INTRAMUSCULAR | Status: DC | PRN
  Administered 2015-10-31: 4 mg via INTRAVENOUS

## 2015-10-31 MED ORDER — HYDRALAZINE HCL 20 MG/ML IJ SOLN: 10.0000 mg | INTRAMUSCULAR | Status: DC | PRN

## 2015-10-31 MED ORDER — ACETAMINOPHEN 650 MG RE SUPP: 650.0000 mg | Freq: Four times a day (QID) | RECTAL | Status: DC | PRN

## 2015-10-31 MED ORDER — ROCURONIUM BROMIDE 100 MG/10ML IV SOLN
INTRAVENOUS | Status: DC | PRN
  Administered 2015-10-31: 20 mg via INTRAVENOUS

## 2015-10-31 MED ORDER — ONDANSETRON HCL 4 MG/2ML IJ SOLN
4.0000 mg | Freq: Once | INTRAMUSCULAR | Status: AC
  Administered 2015-10-31: 4 mg via INTRAVENOUS
  Filled 2015-10-31: qty 2

## 2015-10-31 MED ORDER — 0.9 % SODIUM CHLORIDE (POUR BTL) OPTIME
TOPICAL | Status: DC | PRN
  Administered 2015-10-31: 1000 mL

## 2015-10-31 MED ORDER — DIPHENHYDRAMINE HCL 50 MG/ML IJ SOLN: 12.5000 mg | Freq: Four times a day (QID) | INTRAMUSCULAR | Status: DC | PRN

## 2015-10-31 MED ORDER — IOPAMIDOL (ISOVUE-300) INJECTION 61%
100.0000 mL | Freq: Once | INTRAVENOUS | Status: AC | PRN
  Administered 2015-10-31: 100 mL via INTRAVENOUS

## 2015-10-31 MED ORDER — SUGAMMADEX SODIUM 200 MG/2ML IV SOLN
INTRAVENOUS | Status: DC | PRN
  Administered 2015-10-31: 300 mg via INTRAVENOUS

## 2015-10-31 MED ORDER — BUPIVACAINE-EPINEPHRINE 0.5% -1:200000 IJ SOLN
INTRAMUSCULAR | Status: DC | PRN
  Administered 2015-10-31: 4 mL

## 2015-10-31 MED ORDER — HYDROMORPHONE HCL 1 MG/ML IJ SOLN
INTRAMUSCULAR | Status: AC
  Filled 2015-10-31: qty 2

## 2015-10-31 MED ORDER — MIDAZOLAM HCL 2 MG/2ML IJ SOLN
INTRAMUSCULAR | Status: AC
  Filled 2015-10-31: qty 2

## 2015-10-31 MED ORDER — METOCLOPRAMIDE HCL 5 MG/ML IJ SOLN
INTRAMUSCULAR | Status: DC | PRN
  Administered 2015-10-31: 10 mg via INTRAVENOUS

## 2015-10-31 MED ORDER — SUCCINYLCHOLINE CHLORIDE 200 MG/10ML IV SOSY
PREFILLED_SYRINGE | INTRAVENOUS | Status: DC | PRN
  Administered 2015-10-31: 100 mg via INTRAVENOUS

## 2015-10-31 MED ORDER — SUGAMMADEX SODIUM 500 MG/5ML IV SOLN
INTRAVENOUS | Status: AC
  Filled 2015-10-31: qty 5

## 2015-10-31 MED ORDER — EPHEDRINE SULFATE 50 MG/ML IJ SOLN
INTRAMUSCULAR | Status: DC | PRN
  Administered 2015-10-31: 10 mg via INTRAVENOUS

## 2015-10-31 MED ORDER — HYDROMORPHONE HCL 1 MG/ML IJ SOLN
0.2500 mg | INTRAMUSCULAR | Status: DC | PRN
  Administered 2015-10-31: 0.5 mg via INTRAVENOUS

## 2015-10-31 MED ORDER — OXYCODONE HCL 5 MG PO TABS: 5.0000 mg | ORAL_TABLET | Freq: Once | ORAL | Status: DC | PRN

## 2015-10-31 MED ORDER — MIDAZOLAM HCL 5 MG/5ML IJ SOLN
INTRAMUSCULAR | Status: DC | PRN
  Administered 2015-10-31: 2 mg via INTRAVENOUS

## 2015-10-31 SURGICAL SUPPLY — 44 items
APPLIER CLIP ROT 10 11.4 M/L (STAPLE)
APR CLP MED LRG 11.4X10 (STAPLE)
BAG SPEC RTRVL LRG 6X4 10 (ENDOMECHANICALS) ×1
BLADE SURG ROTATE 9660 (MISCELLANEOUS) IMPLANT
CANISTER SUCTION 2500CC (MISCELLANEOUS) ×2 IMPLANT
CHLORAPREP W/TINT 26ML (MISCELLANEOUS) ×2 IMPLANT
CLIP APPLIE ROT 10 11.4 M/L (STAPLE) IMPLANT
COVER SURGICAL LIGHT HANDLE (MISCELLANEOUS) ×2 IMPLANT
CUTTER FLEX LINEAR 45M (STAPLE) ×2 IMPLANT
DRAPE WARM FLUID 44X44 (DRAPE) ×1 IMPLANT
ELECT REM PT RETURN 9FT ADLT (ELECTROSURGICAL) ×2
ELECTRODE REM PT RTRN 9FT ADLT (ELECTROSURGICAL) ×1 IMPLANT
ENDOLOOP SUT PDS II  0 18 (SUTURE)
ENDOLOOP SUT PDS II 0 18 (SUTURE) IMPLANT
GLOVE BIO SURGEON STRL SZ7 (GLOVE) ×2 IMPLANT
GLOVE BIO SURGEON STRL SZ8 (GLOVE) ×2 IMPLANT
GLOVE BIOGEL PI IND STRL 6.5 (GLOVE) ×1 IMPLANT
GLOVE BIOGEL PI IND STRL 8 (GLOVE) ×1 IMPLANT
GLOVE BIOGEL PI INDICATOR 6.5 (GLOVE) ×1
GLOVE BIOGEL PI INDICATOR 8 (GLOVE) ×1
GLOVE ECLIPSE 6.5 STRL STRAW (GLOVE) ×1 IMPLANT
GOWN STRL REUS W/ TWL LRG LVL3 (GOWN DISPOSABLE) ×1 IMPLANT
GOWN STRL REUS W/ TWL XL LVL3 (GOWN DISPOSABLE) ×1 IMPLANT
GOWN STRL REUS W/TWL LRG LVL3 (GOWN DISPOSABLE) ×2
GOWN STRL REUS W/TWL XL LVL3 (GOWN DISPOSABLE) ×2
KIT BASIN OR (CUSTOM PROCEDURE TRAY) ×2 IMPLANT
KIT ROOM TURNOVER OR (KITS) ×2 IMPLANT
LIQUID BAND (GAUZE/BANDAGES/DRESSINGS) ×2 IMPLANT
NS IRRIG 1000ML POUR BTL (IV SOLUTION) ×2 IMPLANT
PAD ARMBOARD 7.5X6 YLW CONV (MISCELLANEOUS) ×4 IMPLANT
POUCH SPECIMEN RETRIEVAL 10MM (ENDOMECHANICALS) ×2 IMPLANT
RELOAD STAPLE 45 3.5 BLU ETS (ENDOMECHANICALS) ×1 IMPLANT
RELOAD STAPLE TA45 3.5 REG BLU (ENDOMECHANICALS) ×2 IMPLANT
SCALPEL HARMONIC ACE (MISCELLANEOUS) ×2 IMPLANT
SET IRRIG TUBING LAPAROSCOPIC (IRRIGATION / IRRIGATOR) ×2 IMPLANT
SPECIMEN JAR SMALL (MISCELLANEOUS) ×2 IMPLANT
SUT MON AB 4-0 PC3 18 (SUTURE) ×2 IMPLANT
TOWEL OR 17X24 6PK STRL BLUE (TOWEL DISPOSABLE) ×2 IMPLANT
TOWEL OR 17X26 10 PK STRL BLUE (TOWEL DISPOSABLE) ×2 IMPLANT
TRAY FOLEY CATH 16FR SILVER (SET/KITS/TRAYS/PACK) ×2 IMPLANT
TRAY LAPAROSCOPIC MC (CUSTOM PROCEDURE TRAY) ×2 IMPLANT
TROCAR XCEL BLADELESS 5X75MML (TROCAR) ×4 IMPLANT
TROCAR XCEL BLUNT TIP 100MML (ENDOMECHANICALS) ×2 IMPLANT
TUBING INSUFFLATION (TUBING) ×2 IMPLANT

## 2015-10-31 NOTE — Op Note (Signed)
Appendectomy, Lap, Procedure Note  Indications: The patient presented with a history of right-sided abdominal pain. A CT  revealed findings consistent with acute appendicitis. Discussed nonoperative and operative means of treating acute appendicitis. Her examination support acute appendicitis as well. She chose laparoscopic appendectomy. Risk include bleeding, infection, pelvic abscess, leakage from stump, injury to:, Injury to small bowel, injury to bladder, injury to ureters, injury to kidneys, abdominal wall hernia, possible open procedure, respiratory complications, DVT, death, need for open surgery, expected long-term outcomes and follow-up.  Pre-operative Diagnosis: Acute appendicitis without mention of peritonitis  Post-operative Diagnosis: Same  Surgeon: Clearnce Leja A.   Assistants: none  Anesthesia: General endotracheal anesthesia and Local anesthesia 0.25.% bupivacaine, with epinephrine  ASA Class: 2  Procedure Details  The patient was seen again in the Holding Room. The risks, benefits, complications, treatment options, and expected outcomes were discussed with the patient and/or family. The possibilities of reaction to medication, pulmonary aspiration, perforation of viscus, bleeding, recurrent infection, finding a normal appendix, the need for additional procedures, failure to diagnose a condition, and creating a complication requiring transfusion or operation were discussed. There was concurrence with the proposed plan and informed consent was obtained. The site of surgery was properly noted/marked. The patient was taken to Operating Room, identified as Kristina Dunlap and the procedure verified as Appendectomy. A Time Out was held and the above information confirmed.  The patient was placed in the supine position and general anesthesia was induced, along with placement of orogastric tube, Venodyne boots, and a Foley catheter. The abdomen was prepped and draped in a sterile fashion.  A one centimeter infraumbilical incision was made and the peritoneal cavity was accessed using the OPEN  technique. The pneumoperitoneum was then established to steady pressure of 12 mmHg. A 12 mm port was placed through the umbilical incision. Additional 5 mm cannulas then placed in the left lower quadrant of the abdomen and right upper quadrant  under direct vision. A careful evaluation of the entire abdomen was carried out. The patient was placed in Trendelenburg and left lateral decubitus position. The small intestines were retracted in the cephalad and left lateral direction away from the pelvis and right lower quadrant. The patient was found to have an enlarged and inflamed appendix that was extending into the pelvis. There was no evidence of perforation.  The appendix was carefully dissected. A window was made in the mesoappendix at the base of the appendix. A harmonic scalpel was used across the mesoappendix. The appendix was divided at its base using an endo-GIA stapler. Minimal appendiceal stump was left in place. There was no evidence of bleeding, leakage, or complication after division of the appendix. Irrigation was also performed and irrigate suctioned from the abdomen as well.  The umbilical port site was closed using 0 vicryl pursestring sutures fashion at the level of the fascia. The trocar site skin wounds were closed using 4-0 Monocryl .  Instrument, sponge, and needle counts were correct at the conclusion of the case.   Findings: The appendix was found to be inflamed. There were not signs of necrosis.  There was not perforation. There was not abscess formation.  Estimated Blood Loss:  Minimal         Drains: none         Total IV Fluids: 800 mL         Specimens: appendix         Complications:  None; patient tolerated the procedure well.  Disposition: PACU - hemodynamically stable.         Condition: stable

## 2015-10-31 NOTE — Anesthesia Procedure Notes (Signed)
Procedure Name: Intubation Date/Time: 10/31/2015 8:37 PM Performed by: Manus Gunning, Jamie-Lee Galdamez J Pre-anesthesia Checklist: Patient identified, Timeout performed, Emergency Drugs available, Suction available and Patient being monitored Patient Re-evaluated:Patient Re-evaluated prior to inductionOxygen Delivery Method: Circle system utilized Preoxygenation: Pre-oxygenation with 100% oxygen Intubation Type: IV induction Laryngoscope Size: Mac and 3 Grade View: Grade II Tube type: Oral Tube size: 7.0 mm Number of attempts: 1 Placement Confirmation: ETT inserted through vocal cords under direct vision,  breath sounds checked- equal and bilateral and positive ETCO2 Secured at: 21 cm Tube secured with: Tape Dental Injury: Teeth and Oropharynx as per pre-operative assessment

## 2015-10-31 NOTE — Anesthesia Preprocedure Evaluation (Signed)
Anesthesia Evaluation  Patient identified by MRN, date of birth, ID band Patient awake    Reviewed: Allergy & Precautions, H&P , NPO status , Patient's Chart, lab work & pertinent test results  Airway Mallampati: II   Neck ROM: full    Dental   Pulmonary neg pulmonary ROS,    breath sounds clear to auscultation       Cardiovascular hypertension,  Rhythm:regular Rate:Normal     Neuro/Psych  Headaches,    GI/Hepatic   Endo/Other    Renal/GU      Musculoskeletal   Abdominal   Peds  Hematology   Anesthesia Other Findings   Reproductive/Obstetrics                             Anesthesia Physical Anesthesia Plan  ASA: II  Anesthesia Plan: General   Post-op Pain Management:    Induction: Intravenous  Airway Management Planned: Oral ETT  Additional Equipment:   Intra-op Plan:   Post-operative Plan: Extubation in OR  Informed Consent: I have reviewed the patients History and Physical, chart, labs and discussed the procedure including the risks, benefits and alternatives for the proposed anesthesia with the patient or authorized representative who has indicated his/her understanding and acceptance.     Plan Discussed with: CRNA, Anesthesiologist and Surgeon  Anesthesia Plan Comments:         Anesthesia Quick Evaluation

## 2015-10-31 NOTE — H&P (Signed)
Kristina Dunlap is an 50 y.o. female.   Chief Complaint: abdominal pain HPI: asked to see the patient at the request of Dr. Maryan Rued for right-sided abdominal pain. Patient developed diffuse abdominal pain last night and by morning today had progressed to a right lower quadrant pain. The pain is constant and made worse with movement. Is a 7 out of 10. Sharp in nature. CT scan shows acute appendicitis without perforation denies dysuria or pelvic discharge or bleeding.  Past Medical History  Diagnosis Date  . Hyperlipidemia   . Hypertension   . Anemia   . Migraine headache with aura 2012  . Hemorrhoids   . OCD (obsessive compulsive disorder)   . Fibroid   . Menorrhagia     Past Surgical History  Procedure Laterality Date  . Tonsillectomy    . Dilatation & currettage/hysteroscopy with resectocope  12/17/08    of polyps  . Dilation and curettage of uterus    . Hysteroscopy  12/17/08    resection polyps  . Robotic assisted total hysterectomy  11/25/09    simple hyperplasia, fibroids    Family History  Problem Relation Age of Onset  . Heart disease Maternal Grandmother   . Heart disease Maternal Grandfather   . Heart disease Father   . Hyperlipidemia Mother    Social History:  reports that she has never smoked. She has never used smokeless tobacco. She reports that she drinks alcohol. She reports that she does not use illicit drugs.  Allergies:  Allergies  Allergen Reactions  . Erythromycin Nausea Only     (Not in a hospital admission)  Results for orders placed or performed during the hospital encounter of 10/31/15 (from the past 48 hour(s))  Comprehensive metabolic panel     Status: Abnormal   Collection Time: 10/31/15  6:01 PM  Result Value Ref Range   Sodium 136 135 - 145 mmol/L   Potassium 3.7 3.5 - 5.1 mmol/L   Chloride 98 (L) 101 - 111 mmol/L   CO2 27 22 - 32 mmol/L   Glucose, Bld 97 65 - 99 mg/dL   BUN 6 6 - 20 mg/dL   Creatinine, Ser 0.68 0.44 - 1.00 mg/dL   Calcium 9.1 8.9 - 10.3 mg/dL   Total Protein 7.2 6.5 - 8.1 g/dL   Albumin 4.0 3.5 - 5.0 g/dL   AST 20 15 - 41 U/L   ALT 12 (L) 14 - 54 U/L   Alkaline Phosphatase 72 38 - 126 U/L   Total Bilirubin 0.8 0.3 - 1.2 mg/dL   GFR calc non Af Amer >60 >60 mL/min   GFR calc Af Amer >60 >60 mL/min    Comment: (NOTE) The eGFR has been calculated using the CKD EPI equation. This calculation has not been validated in all clinical situations. eGFR's persistently <60 mL/min signify possible Chronic Kidney Disease.    Anion gap 11 5 - 15  CBC     Status: Abnormal   Collection Time: 10/31/15  6:01 PM  Result Value Ref Range   WBC 14.0 (H) 4.0 - 10.5 K/uL   RBC 4.24 3.87 - 5.11 MIL/uL   Hemoglobin 12.8 12.0 - 15.0 g/dL   HCT 37.3 36.0 - 46.0 %   MCV 88.0 78.0 - 100.0 fL   MCH 30.2 26.0 - 34.0 pg   MCHC 34.3 30.0 - 36.0 g/dL   RDW 12.8 11.5 - 15.5 %   Platelets 291 150 - 400 K/uL  Urinalysis, Routine w reflex microscopic (not at  [place])     Status: Abnormal   Collection Time: 10/31/15  6:30 PM  Result Value Ref Range   Color, Urine YELLOW YELLOW   APPearance CLEAR CLEAR   Specific Gravity, Urine 1.005 1.005 - 1.030   pH 6.5 5.0 - 8.0   Glucose, UA NEGATIVE NEGATIVE mg/dL   Hgb urine dipstick TRACE (A) NEGATIVE   Bilirubin Urine NEGATIVE NEGATIVE   Ketones, ur NEGATIVE NEGATIVE mg/dL   Protein, ur NEGATIVE NEGATIVE mg/dL   Nitrite NEGATIVE NEGATIVE   Leukocytes, UA NEGATIVE NEGATIVE  Urine microscopic-add on     Status: Abnormal   Collection Time: 10/31/15  6:30 PM  Result Value Ref Range   Squamous Epithelial / LPF 0-5 (A) NONE SEEN   WBC, UA 0-5 0 - 5 WBC/hpf   RBC / HPF 0-5 0 - 5 RBC/hpf   Bacteria, UA NONE SEEN NONE SEEN   Ct Abdomen Pelvis W Contrast  10/31/2015  CLINICAL DATA:  Right lower quadrant pain and chills. EXAM: CT ABDOMEN AND PELVIS WITH CONTRAST TECHNIQUE: Multidetector CT imaging of the abdomen and pelvis was performed using the standard protocol following bolus  administration of intravenous contrast. CONTRAST:  129m ISOVUE-300 IOPAMIDOL (ISOVUE-300) INJECTION 61% COMPARISON:  None. FINDINGS: Lower chest:  Lung bases clear.  No infiltrate or effusion Hepatobiliary: 10 x 15 mm cyst left lobe liver. Liver otherwise normal. Gallbladder and bile ducts normal. Pancreas: Negative Spleen: Negative Adrenals/Urinary Tract: Normal kidneys bilaterally. Normal contrast excretion. No renal mass or obstruction. No renal calculi. Urinary bladder normal. Stomach/Bowel: Appendix is abnormal measuring 9.3 mm in diameter with periappendiceal stranding which is mild. No abscess or free fluid. The cecum is normal. No appendicolith. Negative for bowel obstruction. No other areas of abnormal bowel identified. Vascular/Lymphatic: Normal aorta and IVC. Negative for lymphadenopathy Reproductive: Hysterectomy.  No pelvic mass. Other: No free fluid.  No abscess or fluid collection Musculoskeletal: Negative IMPRESSION: Acute appendicitis. Early changes of appendiceal inflammation without abscess. Electronically Signed   By: CFranchot GalloM.D.   On: 10/31/2015 15:33    Review of Systems  Constitutional: Positive for fever.  HENT: Negative.   Respiratory: Negative.   Cardiovascular: Negative.   Gastrointestinal: Positive for nausea and abdominal pain. Negative for constipation, blood in stool and melena.  Genitourinary: Negative.   Skin: Negative.   Neurological: Positive for weakness.  Psychiatric/Behavioral: Negative.     Blood pressure 128/73, pulse 80, temperature 97.9 F (36.6 C), temperature source Oral, resp. rate 16, last menstrual period 11/25/2009, SpO2 97 %. Physical Exam  Constitutional: She appears well-developed.  HENT:  Head: Atraumatic.  Eyes: Pupils are equal, round, and reactive to light.  Neck: Normal range of motion. Neck supple.  Cardiovascular: Normal rate.   Respiratory: Effort normal and breath sounds normal.  GI: There is tenderness. There is tenderness  at McBurney's point.  Musculoskeletal: Normal range of motion.  Neurological: She is alert.  Skin: Skin is warm.     Assessment/Plan Acute appendicitis  Discussed operative and nonoperative management strategies for acute appendicitis. Pros and cons of each were discussed with the patient today.  The patient is opted for laparoscopic appendectomy. Risk of bleeding, infection, abscess formation, open procedure, wound complications, hernia, organ injury, bowel injury, bladder injury, injury to the kidney or ureter, the need for other operative procedures, death, DVT, and intrapelvic abscess. She agrees to proceed.  Garlen Reinig A., MD 10/31/2015, 7:28 PM

## 2015-10-31 NOTE — Transfer of Care (Signed)
Immediate Anesthesia Transfer of Care Note  Patient: Kristina Dunlap  Procedure(s) Performed: Procedure(s): APPENDECTOMY LAPAROSCOPIC (N/A)  Patient Location: PACU  Anesthesia Type:General  Level of Consciousness: awake and alert   Airway & Oxygen Therapy: Patient Spontanous Breathing and Patient connected to nasal cannula oxygen  Post-op Assessment: Report given to RN and Post -op Vital signs reviewed and stable  Post vital signs: Reviewed and stable  Last Vitals:  Filed Vitals:   10/31/15 1845 10/31/15 2134  BP: 128/73 139/84  Pulse: 80 101  Temp:  36.4 C  Resp:  12    Complications: No apparent anesthesia complications

## 2015-10-31 NOTE — Progress Notes (Signed)
Received patient from PACU. Patient AOx4, pain at 6/10, BP slightly elevated due to pain.  3 lap sites noted clean and dry.  CNA oriented pt. To room, bed controls and call light. Started with clear diet and gave pt. Apple juice.  Will administered oral PRN pain medication as ordered.  Fiancee at bedside.

## 2015-10-31 NOTE — ED Provider Notes (Signed)
CSN: [id number]     Arrival date & time 10/31/15  1625 History   First MD Initiated Contact with Patient 10/31/15 1826     Chief Complaint  Patient presents with  . Abdominal Pain     (Consider location/radiation/quality/duration/timing/severity/associated sxs/prior Treatment) HPI Comments: Patient with history of hysterectomy presents with right lower quadrant abdominal pain, diagnosed appendicitis on CT ordered by primary care physician today. Patient developed right lower quadrant abdominal pain late last night. She has had associated nausea and anorexia. No fevers, vomiting, or diarrhea. No other medical complaints. No pain medications prior to arrival. The onset of this condition was acute. The course is constant. Aggravating factors: movement and palpation. Alleviating factors: none.    Patient is a 50 y.o. female presenting with abdominal pain. The history is provided by the patient.  Abdominal Pain Associated symptoms: nausea   Associated symptoms: no chest pain, no cough, no diarrhea, no dysuria, no fever, no sore throat and no vomiting     Past Medical History  Diagnosis Date  . Hyperlipidemia   . Hypertension   . Anemia   . Migraine headache with aura 2012  . Hemorrhoids   . OCD (obsessive compulsive disorder)   . Fibroid   . Menorrhagia    Past Surgical History  Procedure Laterality Date  . Tonsillectomy    . Dilatation & currettage/hysteroscopy with resectocope  12/17/08    of polyps  . Dilation and curettage of uterus    . Hysteroscopy  12/17/08    resection polyps  . Robotic assisted total hysterectomy  11/25/09    simple hyperplasia, fibroids   Family History  Problem Relation Age of Onset  . Heart disease Maternal Grandmother   . Heart disease Maternal Grandfather   . Heart disease Father   . Hyperlipidemia Mother    Social History  Substance Use Topics  . Smoking status: Never Smoker   . Smokeless tobacco: Never Used  . Alcohol Use: 0.0 - 1.8 oz/week     0-3 Glasses of wine per week     Comment: beer   OB History    Gravida Para Term Preterm AB TAB SAB Ectopic Multiple Living   0 0 0 0 0 0 0 0 0 0      Review of Systems  Constitutional: Positive for appetite change. Negative for fever.  HENT: Negative for rhinorrhea and sore throat.   Eyes: Negative for redness.  Respiratory: Negative for cough.   Cardiovascular: Negative for chest pain.  Gastrointestinal: Positive for nausea and abdominal pain. Negative for vomiting and diarrhea.  Genitourinary: Negative for dysuria.  Musculoskeletal: Negative for myalgias.  Skin: Negative for rash.  Neurological: Negative for headaches.      Allergies  Erythromycin  Home Medications   Prior to Admission medications   Medication Sig Start Date End Date Taking? Authorizing Provider  Aspirin-Acetaminophen-Caffeine (GOODY HEADACHE PO) Take 1 Package by mouth daily.    Historical Provider, MD  cetirizine (ZYRTEC) 10 MG tablet Take 10 mg by mouth daily.    Historical Provider, MD  Cholecalciferol (VITAMIN D PO) Take 5,000 Int'l Units by mouth daily.    Historical Provider, MD  citalopram (CELEXA) 40 MG tablet Take 40 mg by mouth daily.    Historical Provider, MD  fluticasone Asencion Islam) 50 MCG/ACT nasal spray  01/25/14   Historical Provider, MD  lisinopril-hydrochlorothiazide (PRINZIDE,ZESTORETIC) 20-12.5 MG per tablet Take 1 tablet by mouth daily.    Historical Provider, MD  Misc Natural Products (OSTEO BI-FLEX JOINT  SHIELD PO) Take by mouth daily.     Historical Provider, MD  Multiple Vitamins-Minerals (MULTIVITAMIN WITH MINERALS) tablet Take 1 tablet by mouth daily.    Historical Provider, MD  PROCTOFOAM [facility] rectal foam PLACE RECTALLY THREE TIMES DAILY    Megan Salon, MD   BP 128/73 mmHg  Pulse 80  Temp(Src) 97.9 F (36.6 C) (Oral)  Resp 16  SpO2 97%  LMP 11/25/2009   Physical Exam  Constitutional: She appears well-developed and well-nourished.  HENT:  Head: Normocephalic and  atraumatic.  Eyes: Conjunctivae are normal. Right eye exhibits no discharge. Left eye exhibits no discharge.  Neck: Normal range of motion. Neck supple.  Cardiovascular: Normal rate, regular rhythm and normal heart sounds.   No murmur heard. Pulmonary/Chest: Effort normal and breath sounds normal. No respiratory distress. She has no wheezes. She has no rales.  Abdominal: Soft. There is tenderness in the right lower quadrant. There is rebound, guarding and tenderness at McBurney's point. There is negative Murphy's sign.  Neurological: She is alert.  Skin: Skin is warm and dry.  Psychiatric: She has a normal mood and affect.  Nursing note and vitals reviewed.   ED Course  Procedures (including critical care time) Labs Review Labs Reviewed  COMPREHENSIVE METABOLIC PANEL - Abnormal; Notable for the following:    Chloride 98 (*)    ALT 12 (*)    All other components within normal limits  CBC - Abnormal; Notable for the following:    WBC 14.0 (*)    All other components within normal limits  URINALYSIS, ROUTINE W REFLEX MICROSCOPIC (NOT AT [facility]) - Abnormal; Notable for the following:    Hgb urine dipstick TRACE (*)    All other components within normal limits  URINE MICROSCOPIC-ADD ON - Abnormal; Notable for the following:    Squamous Epithelial / LPF 0-5 (*)    All other components within normal limits    Imaging Review Ct Abdomen Pelvis W Contrast  10/31/2015  CLINICAL DATA:  Right lower quadrant pain and chills. EXAM: CT ABDOMEN AND PELVIS WITH CONTRAST TECHNIQUE: Multidetector CT imaging of the abdomen and pelvis was performed using the standard protocol following bolus administration of intravenous contrast. CONTRAST:  174mL ISOVUE-300 IOPAMIDOL (ISOVUE-300) INJECTION 61% COMPARISON:  None. FINDINGS: Lower chest:  Lung bases clear.  No infiltrate or effusion Hepatobiliary: 10 x 15 mm cyst left lobe liver. Liver otherwise normal. Gallbladder and bile ducts normal. Pancreas: Negative  Spleen: Negative Adrenals/Urinary Tract: Normal kidneys bilaterally. Normal contrast excretion. No renal mass or obstruction. No renal calculi. Urinary bladder normal. Stomach/Bowel: Appendix is abnormal measuring 9.3 mm in diameter with periappendiceal stranding which is mild. No abscess or free fluid. The cecum is normal. No appendicolith. Negative for bowel obstruction. No other areas of abnormal bowel identified. Vascular/Lymphatic: Normal aorta and IVC. Negative for lymphadenopathy Reproductive: Hysterectomy.  No pelvic mass. Other: No free fluid.  No abscess or fluid collection Musculoskeletal: Negative IMPRESSION: Acute appendicitis. Early changes of appendiceal inflammation without abscess. Electronically Signed   By: Franchot Gallo M.D.   On: 10/31/2015 15:33   I have personally reviewed and evaluated these images and lab results as part of my medical decision-making.   EKG Interpretation None       7:09 PM Patient seen and examined. Will call surgery for consult/admit.    Vital signs reviewed and are as follows: BP 128/73 mmHg  Pulse 80  Temp(Src) 97.9 F (36.6 C) (Oral)  Resp 16  SpO2 97%  LMP 11/25/2009  Spoke with Dr. Brantley Stage who will see.   MDM   Final diagnoses:  Acute appendicitis with localized peritonitis   Admit.    Carlisle Cater, PA-C 10/31/15 1916  Blanchie Dessert, MD 11/01/15 0040

## 2015-10-31 NOTE — ED Notes (Signed)
Pt reports to the ED for eval of appendicitis. Pt reports she has had RLQ abd pain all day today and she went to [place] [facility] and had a CT scan done which showed appendicitis. Reports nausea but denies any active V/D. Last time she had anything to eat was last night last drink was 1645. Pt A&OX4, resp e/u, and skin warm and dry.

## 2015-11-01 LAB — BASIC METABOLIC PANEL
Anion gap: 8 (ref 5–15)
BUN: 6 mg/dL (ref 6–20)
CHLORIDE: 104 mmol/L (ref 101–111)
CO2: 28 mmol/L (ref 22–32)
Calcium: 8.4 mg/dL — ABNORMAL LOW (ref 8.9–10.3)
Creatinine, Ser: 0.6 mg/dL (ref 0.44–1.00)
GFR calc Af Amer: >60 mL/min (ref >60)
GFR calc non Af Amer: >60 mL/min (ref >60)
Glucose, Bld: 203 mg/dL — ABNORMAL HIGH (ref 65–99)
POTASSIUM: 4 mmol/L (ref 3.5–5.1)
SODIUM: 140 mmol/L (ref 135–145)

## 2015-11-01 LAB — CBC
HEMATOCRIT: 34 % — AB (ref 36.0–46.0)
HEMOGLOBIN: 10.8 g/dL — AB (ref 12.0–15.0)
MCH: 28.3 pg (ref 26.0–34.0)
MCHC: 31.8 g/dL (ref 30.0–36.0)
MCV: 89 fL (ref 78.0–100.0)
Platelets: 250 10*3/uL (ref 150–400)
RBC: 3.82 MIL/uL — AB (ref 3.87–5.11)
RDW: 12.8 % (ref 11.5–15.5)
WBC: 10.3 10*3/uL (ref 4.0–10.5)

## 2015-11-01 MED ORDER — OXYCODONE HCL 5 MG PO TABS: 5.0000 mg | ORAL_TABLET | ORAL | Status: DC | PRN

## 2015-11-01 NOTE — Progress Notes (Signed)
Patient states that she vomited while using the restroom- she reported that it was not a lot. She also reports slight tremors in her hands. MD Zella Richer has been paged. Awaiting any further orders.

## 2015-11-01 NOTE — Progress Notes (Signed)
1 Day Post-Op  Subjective: Periumbilical soreness.  Voiding.  Needs assistance getting OOB.  Tolerating liquids.  Objective: Vital signs in last 24 hours: Temp:  [97.5 F (36.4 C)-98.5 F (36.9 C)] 97.5 F (36.4 C) (03/11 0709) Pulse Rate:  [80-102] 94 (03/11 0709) Resp:  [10-18] 18 (03/11 0709) BP: (102-141)/(54-84) 102/57 mmHg (03/11 0709) SpO2:  [93 %-100 %] 98 % (03/11 0709) FiO2 (%):  [2 %] 2 % (03/10 2134) Weight:  [71.6 kg (157 lb 13.6 oz)] 71.6 kg (157 lb 13.6 oz) (03/10 2246) Last BM Date: 10/31/15  Intake/Output from previous day: 03/10 0701 - 03/11 0700 In: 1863.3 [P.O.:240; I.V.:1623.3] Out: 71 [Urine:50; Blood:10] Intake/Output this shift:    PE: General- In NAD Abdomen-soft, incisions are clean and intact  Lab Results:   Recent Labs  10/31/15 1801 11/01/15 0549  WBC 14.0* 10.3  HGB 12.8 10.8*  HCT 37.3 34.0*  PLT 291 250   BMET  Recent Labs  10/31/15 1801 11/01/15 0549  NA 136 140  K 3.7 4.0  CL 98* 104  CO2 27 28  GLUCOSE 97 203*  BUN 6 6  CREATININE 0.68 0.60  CALCIUM 9.1 8.4*   PT/INR No results for input(s): LABPROT, INR in the last 72 hours. Comprehensive Metabolic Panel:    Component Value Date/Time   NA 140 11/01/2015 0549   NA 136 10/31/2015 1801   K 4.0 11/01/2015 0549   K 3.7 10/31/2015 1801   CL 104 11/01/2015 0549   CL 98* 10/31/2015 1801   CO2 28 11/01/2015 0549   CO2 27 10/31/2015 1801   BUN 6 11/01/2015 0549   BUN 6 10/31/2015 1801   CREATININE 0.60 11/01/2015 0549   CREATININE 0.68 10/31/2015 1801   GLUCOSE 203* 11/01/2015 0549   GLUCOSE 97 10/31/2015 1801   CALCIUM 8.4* 11/01/2015 0549   CALCIUM 9.1 10/31/2015 1801   AST 20 10/31/2015 1801   AST 15 11/24/2009 1015   ALT 12* 10/31/2015 1801   ALT 8 11/24/2009 1015   ALKPHOS 72 10/31/2015 1801   ALKPHOS 51 11/24/2009 1015   BILITOT 0.8 10/31/2015 1801   BILITOT 0.2* 11/24/2009 1015   PROT 7.2 10/31/2015 1801   PROT 7.0 11/24/2009 1015   ALBUMIN 4.0  10/31/2015 1801   ALBUMIN 3.7 11/24/2009 1015     Studies/Results: Ct Abdomen Pelvis W Contrast  10/31/2015  CLINICAL DATA:  Right lower quadrant pain and chills. EXAM: CT ABDOMEN AND PELVIS WITH CONTRAST TECHNIQUE: Multidetector CT imaging of the abdomen and pelvis was performed using the standard protocol following bolus administration of intravenous contrast. CONTRAST:  119mL ISOVUE-300 IOPAMIDOL (ISOVUE-300) INJECTION 61% COMPARISON:  None. FINDINGS: Lower chest:  Lung bases clear.  No infiltrate or effusion Hepatobiliary: 10 x 15 mm cyst left lobe liver. Liver otherwise normal. Gallbladder and bile ducts normal. Pancreas: Negative Spleen: Negative Adrenals/Urinary Tract: Normal kidneys bilaterally. Normal contrast excretion. No renal mass or obstruction. No renal calculi. Urinary bladder normal. Stomach/Bowel: Appendix is abnormal measuring 9.3 mm in diameter with periappendiceal stranding which is mild. No abscess or free fluid. The cecum is normal. No appendicolith. Negative for bowel obstruction. No other areas of abnormal bowel identified. Vascular/Lymphatic: Normal aorta and IVC. Negative for lymphadenopathy Reproductive: Hysterectomy.  No pelvic mass. Other: No free fluid.  No abscess or fluid collection Musculoskeletal: Negative IMPRESSION: Acute appendicitis. Early changes of appendiceal inflammation without abscess. Electronically Signed   By: Franchot Gallo M.D.   On: 10/31/2015 15:33    Anti-infectives: Anti-infectives  Start     Dose/Rate Route Frequency Ordered Stop   10/31/15 1930  cefTRIAXone (ROCEPHIN) 2 g in dextrose 5 % 50 mL IVPB     2 g 100 mL/hr over 30 Minutes Intravenous  Once 10/31/15 1927 10/31/15 2004   10/31/15 1930  metroNIDAZOLE (FLAGYL) IVPB 500 mg     500 mg 100 mL/hr over 60 Minutes Intravenous  Once 10/31/15 1927 10/31/15 2035      Assessment Active Problems:   Acute appendicitis s/p laparoscopic appendectomy 10/31/15-stable overnight      Plan: If  she progress well today, will discharge this afternoon.  If not, will keep in hospital another night.  Discharge instructions discussed with her.   Mettie Roylance J 11/01/2015

## 2015-11-01 NOTE — Progress Notes (Signed)
Pt feels nauseated (PRN Zofran given)- states that she did not handle her breakfast as well as she thought she would have. Pt stated that she will continue to rest and see how she feels later on in the day.

## 2015-11-01 NOTE — Discharge Instructions (Signed)
LAPAROSCOPIC APPENDECTOMY SURGERY: POST OP INSTRUCTIONS  1. DIET: Follow a light bland diet the first 24 hours after arrival home, such as soup, liquids, crackers, etc.  Be sure to include lots of fluids daily.  Avoid fast food or heavy meals as your are more likely to get nauseated.  Eat a low fat the next few days after surgery.   2. Take your usually prescribed home medications unless otherwise directed.  Stop Goody powders. 3. PAIN CONTROL: a. Pain is best controlled by a usual combination of three different methods TOGETHER: i. Ice/Heat ii. Over the counter pain medication iii. Prescription pain medication b. Most patients will experience some swelling and bruising around the incisions.  Ice packs or heating pads (30-60 minutes up to 6 times a day) will help. Use ice for the first few days to help decrease swelling and bruising, then switch to heat to help relax tight/sore spots and speed recovery.  Some people prefer to use ice alone, heat alone, alternating between ice & heat.  Experiment to what works for you.  Swelling and bruising can take several weeks to resolve.   c. It is helpful to take an over-the-counter pain medication regularly for the first few weeks.  Choose one of the following that works best for you: i. Naproxen (Aleve, etc)  Two 220mg  tabs twice a day ii. Ibuprofen (Advil, etc) Three 200mg  tabs four times a day (every meal & bedtime) iii. Acetaminophen (Tylenol, etc) 500-650mg  four times a day (every meal & bedtime) d. A  prescription for pain medication (such as oxycodone, hydrocodone, etc) should be given to you upon discharge.  Take your pain medication as prescribed.  i. If you are having problems/concerns with the prescription medicine (does not control pain, nausea, vomiting, rash, itching, etc), please call us [phone] to see if we need to switch you to a different pain medicine that will work better for you and/or control your side effect better. ii. If you need  a refill on your pain medication, please contact your pharmacy.  They will contact our office to request authorization. Prescriptions will not be filled after 5 pm or on week-ends. 4. Avoid getting constipated.  Between the surgery and the pain medications, it is common to experience some constipation.  Increasing fluid intake and taking a fiber supplement (such as Metamucil, Citrucel, FiberCon, MiraLax, etc) 1-2 times a day regularly will usually help prevent this problem from occurring.  A mild laxative (prune juice, Milk of Magnesia, MiraLax, etc) should be taken according to package directions if there are no bowel movements after 48 hours.   5. Watch out for diarrhea.  If you have many loose bowel movements, simplify your diet to bland foods & liquids for a few days.  Stop any stool softeners and decrease your fiber supplement.  Switching to mild anti-diarrheal medications (Kayopectate, Pepto Bismol) can help.  If this worsens or does not improve, please call us. 6. Wash / shower every day.  You may shower over the dressings as they are waterproof.  Continue to shower over incision(s) after the dressing is off. 7. Remove your waterproof bandages 5 days after surgery.  You may leave the incision open to air.  You may replace a dressing/Band-Aid to cover the incision for comfort if you wish.  8. ACTIVITIES as tolerated:   a. You may resume regular (light) daily activities beginning the next day--such as daily self-care, walking, climbing stairs--gradually increasing light activities as tolerated.  No heavy lifting (  over 10 pounds), straining, or intense activities for 2 weeks. b. DO NOT PUSH THROUGH PAIN.  Let pain be your guide: If it hurts to do something, don't do it.  Pain is your body warning you to avoid that activity for another week until the pain goes down. c. You may drive when you are no longer taking prescription pain medication, you can comfortably wear a seatbelt, and you can safely maneuver  your car and apply brakes. d. Dennis Bast may have sexual intercourse when it is comfortable.  9. FOLLOW UP in our office a. Please call [facility] at [phone] to set up an appointment to see your surgeon in the office for a follow-up appointment approximately 2-3 weeks after your surgery. b. Make sure that you call for this appointment the day you arrive home to insure a convenient appointment time. 10. IF YOU HAVE DISABILITY OR FAMILY LEAVE FORMS, BRING THEM TO THE OFFICE FOR PROCESSING.  DO NOT GIVE THEM TO YOUR DOCTOR.  11.  Return to work/school:  Desk work/light activities in 5-7 days, full duty/activities in 2 weeks if pain-free.   WHEN TO CALL us [phone]: 1. Poor pain control 2. Reactions / problems with new medications (rash/itching, nausea, etc)  3. Fever over 101.5 F (38.5 C) 4. Inability to urinate 5. Nausea and/or vomiting 6. Worsening swelling or bruising 7. Continued bleeding from incision. 8. Increased pain, redness, or drainage from the incision   The clinic staff is available to answer your questions during regular business hours (8:30am-5pm).  Please dont hesitate to call and ask to speak to one of our nurses for clinical concerns.   If you have a medical emergency, go to the nearest emergency room or call 911.  A surgeon from [facility] is always on call at the [facility], [place], [place], [place], [place]  [address] ? MAIN: [phone] ? TOLL FREE: [phone] ?  FAX [phone]) [phone] [email or web address]

## 2015-11-01 NOTE — Progress Notes (Signed)
Patient had second round of emesis- states that she is not feeling well. Will stay the night

## 2015-11-02 MED ORDER — BUPROPION HCL ER (XL) 150 MG PO TB24
150.0000 mg | ORAL_TABLET | Freq: Every day | ORAL | Status: DC
  Administered 2015-11-02: 150 mg via ORAL
  Filled 2015-11-02 (×2): qty 1

## 2015-11-02 MED ORDER — HYDROCHLOROTHIAZIDE 12.5 MG PO CAPS
12.5000 mg | ORAL_CAPSULE | Freq: Every day | ORAL | Status: DC
  Filled 2015-11-02 (×2): qty 1

## 2015-11-02 MED ORDER — LISINOPRIL-HYDROCHLOROTHIAZIDE 20-12.5 MG PO TABS: 1.0000 | ORAL_TABLET | Freq: Every day | ORAL | Status: DC

## 2015-11-02 MED ORDER — ADULT MULTIVITAMIN W/MINERALS CH
1.0000 | ORAL_TABLET | Freq: Every day | ORAL | Status: DC
  Administered 2015-11-02: 1 via ORAL
  Filled 2015-11-02 (×3): qty 1

## 2015-11-02 MED ORDER — LISINOPRIL 20 MG PO TABS
20.0000 mg | ORAL_TABLET | Freq: Every day | ORAL | Status: DC
  Administered 2015-11-02 – 2015-11-03 (×2): 20 mg via ORAL
  Filled 2015-11-02: qty 1

## 2015-11-02 MED ORDER — LISINOPRIL 20 MG PO TABS
20.0000 mg | ORAL_TABLET | Freq: Every day | ORAL | Status: DC
  Filled 2015-11-02: qty 1

## 2015-11-02 MED ORDER — CITALOPRAM HYDROBROMIDE 20 MG PO TABS
40.0000 mg | ORAL_TABLET | Freq: Every day | ORAL | Status: DC
  Administered 2015-11-02: 40 mg via ORAL
  Filled 2015-11-02 (×2): qty 2

## 2015-11-02 MED ORDER — HYDROCHLOROTHIAZIDE 12.5 MG PO CAPS
12.5000 mg | ORAL_CAPSULE | Freq: Every day | ORAL | Status: DC
  Administered 2015-11-02 – 2015-11-03 (×2): 12.5 mg via ORAL
  Filled 2015-11-02: qty 1

## 2015-11-02 MED ORDER — LORATADINE 10 MG PO TABS
10.0000 mg | ORAL_TABLET | Freq: Every day | ORAL | Status: DC
  Administered 2015-11-02: 10 mg via ORAL
  Filled 2015-11-02 (×2): qty 1

## 2015-11-02 MED ORDER — OSTEO BI-FLEX JOINT SHIELD PO TABS: ORAL_TABLET | Freq: Every day | ORAL | Status: DC

## 2015-11-02 MED ORDER — FLUTICASONE PROPIONATE 50 MCG/ACT NA SUSP
2.0000 | Freq: Every day | NASAL | Status: DC
  Administered 2015-11-02: 2 via NASAL
  Filled 2015-11-02: qty 16

## 2015-11-02 NOTE — Anesthesia Postprocedure Evaluation (Signed)
Anesthesia Post Note  Patient: Kristina Dunlap  Procedure(s) Performed: Procedure(s) (LRB): APPENDECTOMY LAPAROSCOPIC (N/A)  Patient location during evaluation: PACU Anesthesia Type: General Level of consciousness: awake and alert and patient cooperative Pain management: pain level controlled Vital Signs Assessment: post-procedure vital signs reviewed and stable Respiratory status: spontaneous breathing and respiratory function stable Cardiovascular status: stable Anesthetic complications: no    Last Vitals:  Filed Vitals:   11/01/15 2145 11/02/15 0513  BP: 143/75 147/77  Pulse: 87 99  Temp: 36.7 C 36.6 C  Resp: 18 18    Last Pain:  Filed Vitals:   11/02/15 0513  PainSc: [place]

## 2015-11-02 NOTE — Progress Notes (Signed)
2 Days Post-Op  Subjective: Fair amount of n/v yesterday.  Passing some gas.  Objective: Vital signs in last 24 hours: Temp:  [97.9 F (36.6 C)-98 F (36.7 C)] 97.9 F (36.6 C) (03/12 0513) Pulse Rate:  [87-99] 99 (03/12 0513) Resp:  [18] 18 (03/12 0513) BP: (128-147)/(70-78) 147/77 mmHg (03/12 0513) SpO2:  [97 %-100 %] 100 % (03/12 0513) Last BM Date: 10/31/15  Intake/Output from previous day: 03/11 0701 - 03/12 0700 In: 600 [P.O.:600] Out: 2 [Emesis/NG output:2] Intake/Output this shift:    PE: General- In NAD Abdomen-soft, incisions are clean and intact  Lab Results:   Recent Labs  10/31/15 1801 11/01/15 0549  WBC 14.0* 10.3  HGB 12.8 10.8*  HCT 37.3 34.0*  PLT 291 250   BMET  Recent Labs  10/31/15 1801 11/01/15 0549  NA 136 140  K 3.7 4.0  CL 98* 104  CO2 27 28  GLUCOSE 97 203*  BUN 6 6  CREATININE 0.68 0.60  CALCIUM 9.1 8.4*   PT/INR No results for input(s): LABPROT, INR in the last 72 hours. Comprehensive Metabolic Panel:    Component Value Date/Time   NA 140 11/01/2015 0549   NA 136 10/31/2015 1801   K 4.0 11/01/2015 0549   K 3.7 10/31/2015 1801   CL 104 11/01/2015 0549   CL 98* 10/31/2015 1801   CO2 28 11/01/2015 0549   CO2 27 10/31/2015 1801   BUN 6 11/01/2015 0549   BUN 6 10/31/2015 1801   CREATININE 0.60 11/01/2015 0549   CREATININE 0.68 10/31/2015 1801   GLUCOSE 203* 11/01/2015 0549   GLUCOSE 97 10/31/2015 1801   CALCIUM 8.4* 11/01/2015 0549   CALCIUM 9.1 10/31/2015 1801   AST 20 10/31/2015 1801   AST 15 11/24/2009 1015   ALT 12* 10/31/2015 1801   ALT 8 11/24/2009 1015   ALKPHOS 72 10/31/2015 1801   ALKPHOS 51 11/24/2009 1015   BILITOT 0.8 10/31/2015 1801   BILITOT 0.2* 11/24/2009 1015   PROT 7.2 10/31/2015 1801   PROT 7.0 11/24/2009 1015   ALBUMIN 4.0 10/31/2015 1801   ALBUMIN 3.7 11/24/2009 1015     Studies/Results: Ct Abdomen Pelvis W Contrast  10/31/2015  CLINICAL DATA:  Right lower quadrant pain and chills.  EXAM: CT ABDOMEN AND PELVIS WITH CONTRAST TECHNIQUE: Multidetector CT imaging of the abdomen and pelvis was performed using the standard protocol following bolus administration of intravenous contrast. CONTRAST:  13mL ISOVUE-300 IOPAMIDOL (ISOVUE-300) INJECTION 61% COMPARISON:  None. FINDINGS: Lower chest:  Lung bases clear.  No infiltrate or effusion Hepatobiliary: 10 x 15 mm cyst left lobe liver. Liver otherwise normal. Gallbladder and bile ducts normal. Pancreas: Negative Spleen: Negative Adrenals/Urinary Tract: Normal kidneys bilaterally. Normal contrast excretion. No renal mass or obstruction. No renal calculi. Urinary bladder normal. Stomach/Bowel: Appendix is abnormal measuring 9.3 mm in diameter with periappendiceal stranding which is mild. No abscess or free fluid. The cecum is normal. No appendicolith. Negative for bowel obstruction. No other areas of abnormal bowel identified. Vascular/Lymphatic: Normal aorta and IVC. Negative for lymphadenopathy Reproductive: Hysterectomy.  No pelvic mass. Other: No free fluid.  No abscess or fluid collection Musculoskeletal: Negative IMPRESSION: Acute appendicitis. Early changes of appendiceal inflammation without abscess. Electronically Signed   By: Franchot Gallo M.D.   On: 10/31/2015 15:33    Anti-infectives: Anti-infectives    Start     Dose/Rate Route Frequency Ordered Stop   10/31/15 1930  cefTRIAXone (ROCEPHIN) 2 g in dextrose 5 % 50 mL IVPB  2 g 100 mL/hr over 30 Minutes Intravenous  Once 10/31/15 1927 11/02/15 0804   10/31/15 1930  metroNIDAZOLE (FLAGYL) IVPB 500 mg     500 mg 100 mL/hr over 60 Minutes Intravenous  Once 10/31/15 1927 11/02/15 0804      Assessment Active Problems:   Acute appendicitis s/p laparoscopic appendectomy 10/31/15-did not tolerate her diet yesterday.      Plan: Keep in hospital today to see if she can tolerate a diet.   Kristina Dunlap J 11/02/2015

## 2015-11-03 ENCOUNTER — Encounter (HOSPITAL_COMMUNITY): Payer: Self-pay | Admitting: Surgery

## 2015-11-03 MED ORDER — ONDANSETRON 4 MG PO TBDP: 4.0000 mg | ORAL_TABLET | Freq: Four times a day (QID) | ORAL | Status: DC | PRN

## 2015-11-03 NOTE — Discharge Summary (Signed)
  Physician Discharge Summary  Patient ID: Kristina Dunlap MRN: [id number] DOB/AGE: 51-09-1965 51 y.o.  Admit date: 10/31/2015 Discharge date: 11/03/2015  Admitting Diagnosis: Acute appendicitis   Discharge Diagnosis Patient Active Problem List   Diagnosis Date Noted  . Acute appendicitis 10/31/2015  . Hypertension   . Migraine headache with aura   . OCD (obsessive compulsive disorder)   . Hemorrhoids 08/30/2012    Consultants none  Imaging: No results found.  Procedures Laparoscopic appendectomy--Dr. Brantley Stage 10/31/15  Hospital Course:  Kristina Dunlap presented to [facility] with abdominal pain.  Workup showed acute appendicitis on CT.  Patient was admitted and underwent procedure listed above.  Tolerated procedure well and was transferred to the floor.  Diet was advanced as tolerated.  On POD#1 and 2 she had nausea and therefore remained in the hospital.  Otherwise, remained stable. On POD#3, the patient was voiding well, tolerating diet, ambulating well, pain well controlled, vital signs stable, incisions c/d/i and felt stable for discharge home. Medication risks, benefits and therapeutic alternatives were reviewed with the patient.  She/he verbalizes understanding.  Patient will follow up in our office in 3 weeks and knows to call with questions or concerns.  Physical Exam: General:  Alert, NAD, pleasant, comfortable Abd:  Soft, ND, mild tenderness, incisions C/D/I    Medication List    STOP taking these medications        GOODY HEADACHE PO      TAKE these medications        buPROPion 150 MG 24 hr tablet  Commonly known as:  WELLBUTRIN XL  Take 150 mg by mouth daily.     cetirizine 10 MG tablet  Commonly known as:  ZYRTEC  Take 10 mg by mouth daily.     citalopram 40 MG tablet  Commonly known as:  CELEXA  Take 40 mg by mouth daily.     fluticasone 50 MCG/ACT nasal spray  Commonly known as:  FLONASE  Place 2 sprays into both nostrils daily.     lisinopril-hydrochlorothiazide 20-12.5 MG tablet  Commonly known as:  PRINZIDE,ZESTORETIC  Take 1 tablet by mouth daily.     multivitamin with minerals tablet  Take 1 tablet by mouth daily.     ondansetron 4 MG disintegrating tablet  Commonly known as:  ZOFRAN-ODT  Take 1 tablet (4 mg total) by mouth every 6 (six) hours as needed for nausea.     OSTEO BI-FLEX JOINT SHIELD PO  Take 1 tablet by mouth daily.     oxyCODONE 5 MG immediate release tablet  Commonly known as:  Oxy IR/ROXICODONE  Take 1-2 tablets (5-10 mg total) by mouth every 4 (four) hours as needed for moderate pain.     VITAMIN D PO  Take 5,000 Int'l Units by mouth daily.           SignedErby Pian, [facility] [phone]  11/03/2015, 8:23 AM

## 2015-11-03 NOTE — Progress Notes (Signed)
Discharge home. Home discharge instruction given, no question verbalized, 

## 2016-02-26 ENCOUNTER — Ambulatory Visit (INDEPENDENT_AMBULATORY_CARE_PROVIDER_SITE_OTHER): Payer: BLUE CROSS/BLUE SHIELD | Admitting: Obstetrics and Gynecology

## 2016-02-26 ENCOUNTER — Encounter: Payer: Self-pay | Admitting: Obstetrics and Gynecology

## 2016-02-26 VITALS — BP 120/68 | HR 82 | Resp 16 | Ht 64.5 in | Wt 141.8 lb

## 2016-02-26 DIAGNOSIS — Z0000 Encounter for general adult medical examination without abnormal findings: Secondary | ICD-10-CM | POA: Diagnosis not present

## 2016-02-26 DIAGNOSIS — N760 Acute vaginitis: Secondary | ICD-10-CM

## 2016-02-26 DIAGNOSIS — Z01419 Encounter for gynecological examination (general) (routine) without abnormal findings: Secondary | ICD-10-CM

## 2016-02-26 LAB — POCT URINALYSIS DIPSTICK
BILIRUBIN UA: NEGATIVE
GLUCOSE UA: NEGATIVE
KETONES UA: NEGATIVE
LEUKOCYTES UA: NEGATIVE
NITRITE UA: NEGATIVE
Protein, UA: NEGATIVE
Urobilinogen, UA: NEGATIVE
pH, UA: 8

## 2016-02-26 MED ORDER — NYSTATIN-TRIAMCINOLONE 100000-0.1 UNIT/GM-% EX CREA: 1.0000 "application " | TOPICAL_CREAM | Freq: Two times a day (BID) | CUTANEOUS | Status: DC

## 2016-02-26 NOTE — Patient Instructions (Signed)
Health Maintenance, Female Adopting a healthy lifestyle and getting preventive care can go a long way to promote health and wellness. Talk with your health care provider about what schedule of regular examinations is right for you. This is a good chance for you to check in with your provider about disease prevention and staying healthy. In between checkups, there are plenty of things you can do on your own. Experts have done a lot of research about which lifestyle changes and preventive measures are most likely to keep you healthy. Ask your health care provider for more information. WEIGHT AND DIET  Eat a healthy diet  Be sure to include plenty of vegetables, fruits, low-fat dairy products, and lean protein.  Do not eat a lot of foods high in solid fats, added sugars, or salt.  Get regular exercise. This is one of the most important things you can do for your health.  Most adults should exercise for at least 150 minutes each week. The exercise should increase your heart rate and make you sweat (moderate-intensity exercise).  Most adults should also do strengthening exercises at least twice a week. This is in addition to the moderate-intensity exercise.  Maintain a healthy weight  Body mass index (BMI) is a measurement that can be used to identify possible weight problems. It estimates body fat based on height and weight. Your health care provider can help determine your BMI and help you achieve or maintain a healthy weight.  For females 20 years of age and older:   A BMI below 18.5 is considered underweight.  A BMI of 18.5 to 24.9 is normal.  A BMI of 25 to 29.9 is considered overweight.  A BMI of 30 and above is considered obese.  Watch levels of cholesterol and blood lipids  You should start having your blood tested for lipids and cholesterol at 51 years of age, then have this test every 5 years.  You may need to have your cholesterol levels checked more often if:  Your lipid  or cholesterol levels are high.  You are older than 50 years of age.  You are at high risk for heart disease.  CANCER SCREENING   Lung Cancer  Lung cancer screening is recommended for adults 55-80 years old who are at high risk for lung cancer because of a history of smoking.  A yearly low-dose CT scan of the lungs is recommended for people who:  Currently smoke.  Have quit within the past 15 years.  Have at least a 30-pack-year history of smoking. A pack year is smoking an average of one pack of cigarettes a day for 1 year.  Yearly screening should continue until it has been 15 years since you quit.  Yearly screening should stop if you develop a health problem that would prevent you from having lung cancer treatment.  Breast Cancer  Practice breast self-awareness. This means understanding how your breasts normally appear and feel.  It also means doing regular breast self-exams. Let your health care provider know about any changes, no matter how small.  If you are in your 20s or 30s, you should have a clinical breast exam (CBE) by a health care provider every 1-3 years as part of a regular health exam.  If you are 40 or older, have a CBE every year. Also consider having a breast X-ray (mammogram) every year.  If you have a family history of breast cancer, talk to your health care provider about genetic screening.  If you   are at high risk for breast cancer, talk to your health care provider about having an MRI and a mammogram every year.  Breast cancer gene (BRCA) assessment is recommended for women who have family members with BRCA-related cancers. BRCA-related cancers include:  Breast.  Ovarian.  Tubal.  Peritoneal cancers.  Results of the assessment will determine the need for genetic counseling and BRCA1 and BRCA2 testing. Cervical Cancer Your health care provider may recommend that you be screened regularly for cancer of the pelvic organs (ovaries, uterus, and  vagina). This screening involves a pelvic examination, including checking for microscopic changes to the surface of your cervix (Pap test). You may be encouraged to have this screening done every 3 years, beginning at age 21.  For women ages 30-65, health care providers may recommend pelvic exams and Pap testing every 3 years, or they may recommend the Pap and pelvic exam, combined with testing for human papilloma virus (HPV), every 5 years. Some types of HPV increase your risk of cervical cancer. Testing for HPV may also be done on women of any age with unclear Pap test results.  Other health care providers may not recommend any screening for nonpregnant women who are considered low risk for pelvic cancer and who do not have symptoms. Ask your health care provider if a screening pelvic exam is right for you.  If you have had past treatment for cervical cancer or a condition that could lead to cancer, you need Pap tests and screening for cancer for at least 20 years after your treatment. If Pap tests have been discontinued, your risk factors (such as having a new sexual partner) need to be reassessed to determine if screening should resume. Some women have medical problems that increase the chance of getting cervical cancer. In these cases, your health care provider may recommend more frequent screening and Pap tests. Colorectal Cancer  This type of cancer can be detected and often prevented.  Routine colorectal cancer screening usually begins at 50 years of age and continues through 51 years of age.  Your health care provider may recommend screening at an earlier age if you have risk factors for colon cancer.  Your health care provider may also recommend using home test kits to check for hidden blood in the stool.  A small camera at the end of a tube can be used to examine your colon directly (sigmoidoscopy or colonoscopy). This is done to check for the earliest forms of colorectal  cancer.  Routine screening usually begins at age 50.  Direct examination of the colon should be repeated every 5-10 years through 51 years of age. However, you may need to be screened more often if early forms of precancerous polyps or small growths are found. Skin Cancer  Check your skin from head to toe regularly.  Tell your health care provider about any new moles or changes in moles, especially if there is a change in a mole's shape or color.  Also tell your health care provider if you have a mole that is larger than the size of a pencil eraser.  Always use sunscreen. Apply sunscreen liberally and repeatedly throughout the day.  Protect yourself by wearing long sleeves, pants, a wide-brimmed hat, and sunglasses whenever you are outside. HEART DISEASE, DIABETES, AND HIGH BLOOD PRESSURE   High blood pressure causes heart disease and increases the risk of stroke. High blood pressure is more likely to develop in:  People who have blood pressure in the high end   of the normal range (130-139/85-89 mm Hg).  People who are overweight or obese.  People who are African American.  If you are 38-23 years of age, have your blood pressure checked every 3-5 years. If you are 61 years of age or older, have your blood pressure checked every year. You should have your blood pressure measured twice--once when you are at a hospital or clinic, and once when you are not at a hospital or clinic. Record the average of the two measurements. To check your blood pressure when you are not at a hospital or clinic, you can use:  An automated blood pressure machine at a pharmacy.  A home blood pressure monitor.  If you are between 45 years and 39 years old, ask your health care provider if you should take aspirin to prevent strokes.  Have regular diabetes screenings. This involves taking a blood sample to check your fasting blood sugar level.  If you are at a normal weight and have a low risk for diabetes,  have this test once every three years after 51 years of age.  If you are overweight and have a high risk for diabetes, consider being tested at a younger age or more often. PREVENTING INFECTION  Hepatitis B  If you have a higher risk for hepatitis B, you should be screened for this virus. You are considered at high risk for hepatitis B if:  You were born in a country where hepatitis B is common. Ask your health care provider which countries are considered high risk.  Your parents were born in a high-risk country, and you have not been immunized against hepatitis B (hepatitis B vaccine).  You have HIV or AIDS.  You use needles to inject street drugs.  You live with someone who has hepatitis B.  You have had sex with someone who has hepatitis B.  You get hemodialysis treatment.  You take certain medicines for conditions, including cancer, organ transplantation, and autoimmune conditions. Hepatitis C  Blood testing is recommended for:  Everyone born from [birth year] through 1966.  Anyone with known risk factors for hepatitis C. Sexually transmitted infections (STIs)  You should be screened for sexually transmitted infections (STIs) including gonorrhea and chlamydia if:  You are sexually active and are younger than 51 years of age.  You are older than 51 years of age and your health care provider tells you that you are at risk for this type of infection.  Your sexual activity has changed since you were last screened and you are at an increased risk for chlamydia or gonorrhea. Ask your health care provider if you are at risk.  If you do not have HIV, but are at risk, it may be recommended that you take a prescription medicine daily to prevent HIV infection. This is called pre-exposure prophylaxis (PrEP). You are considered at risk if:  You are sexually active and do not regularly use condoms or know the HIV status of your partner(s).  You take drugs by injection.  You are sexually  active with a partner who has HIV. Talk with your health care provider about whether you are at high risk of being infected with HIV. If you choose to begin PrEP, you should first be tested for HIV. You should then be tested every 3 months for as long as you are taking PrEP.  PREGNANCY   If you are premenopausal and you may become pregnant, ask your health care provider about preconception counseling.  If you may  become pregnant, take 400 to 800 micrograms (mcg) of folic acid every day.  If you want to prevent pregnancy, talk to your health care provider about birth control (contraception). OSTEOPOROSIS AND MENOPAUSE   Osteoporosis is a disease in which the bones lose minerals and strength with aging. This can result in serious bone fractures. Your risk for osteoporosis can be identified using a bone density scan.  If you are 14 years of age or older, or if you are at risk for osteoporosis and fractures, ask your health care provider if you should be screened.  Ask your health care provider whether you should take a calcium or vitamin D supplement to lower your risk for osteoporosis.  Menopause may have certain physical symptoms and risks.  Hormone replacement therapy may reduce some of these symptoms and risks. Talk to your health care provider about whether hormone replacement therapy is right for you.  HOME CARE INSTRUCTIONS   Schedule regular health, dental, and eye exams.  Stay current with your immunizations.   Do not use any tobacco products including cigarettes, chewing tobacco, or electronic cigarettes.  If you are pregnant, do not drink alcohol.  If you are breastfeeding, limit how much and how often you drink alcohol.  Limit alcohol intake to no more than 1 drink per day for nonpregnant women. One drink equals 12 ounces of beer, 5 ounces of wine, or 1 ounces of hard liquor.  Do not use street drugs.  Do not share needles.  Ask your health care provider for help if  you need support or information about quitting drugs.  Tell your health care provider if you often feel depressed.  Tell your health care provider if you have ever been abused or do not feel safe at home.   This information is not intended to replace advice given to you by your health care provider. Make sure you discuss any questions you have with your health care provider.   Document Released: 02/22/2011 Document Revised: 08/30/2014 Document Reviewed: 07/11/2013 Elsevier Interactive Patient Education 2016 [employer].  Menopause and Herbal Products WHAT IS MENOPAUSE? Menopause is the normal time of life when menstrual periods decrease in frequency and eventually stop completely. This process can take several years for some women. Menopause is complete when you have had an absence of menstruation for a full year since your last menstrual period. It usually occurs between the ages of 13 and 25. It is not common for menopause to begin before the age of 72. During menopause, your body stops producing the female hormones estrogen and progesterone. Common symptoms associated with this loss of hormones (vasomotor symptoms) are:  Hot flashes.  Hot flushes.  Night sweats. Other common symptoms and complications of menopause include:  Decrease in sex drive.  Vaginal dryness and thinning of the walls of the vagina. This can make sex painful.  Dryness of the skin and development of wrinkles.  Headaches.  Tiredness.  Irritability.  Memory problems.  Weight gain.  Bladder infections.  Hair growth on the face and chest.  Inability to reproduce offspring (infertility).  Loss of density in the bones (osteoporosis) increasing your risk for breaks (fractures).  Depression.  Hardening and narrowing of the arteries (atherosclerosis). This increases your risk of heart attack and stroke. WHAT TREATMENT OPTIONS ARE AVAILABLE? There are many treatment choices for menopause symptoms. The  most common treatment is hormone replacement therapy. Many alternative therapies for menopause are emerging, including the use of herbal products. These supplements can  be found in the form of herbs, teas, oils, tinctures, and pills. Common herbal supplements for menopause are made from plants that contain phytoestrogens. Phytoestrogens are compounds that occur naturally in plants and plant products. They act like estrogen in the body. Foods and herbs that contain phytoestrogens include:  Soy.  Flax seeds.  Red clover.  Ginseng. WHAT MENOPAUSE SYMPTOMS MAY BE HELPED IF I USE HERBAL PRODUCTS?  Vasomotor symptoms. These may be helped by:  Soy. Some studies show that soy may have a moderate benefit for hot flashes.  Black cohosh. There is limited evidence indicating this may be beneficial for hot flashes.  Symptoms that are related to heart and blood vessel disease. These may be helped by soy. Studies have shown that soy can help to lower cholesterol.  Depression. This may be helped by:  St. John's wort. There is limited evidence that shows this may help mild to moderate depression.  Black cohosh. There is evidence that this may help depression and mood swings.  Osteoporosis. Soy may help to decrease bone loss that is associated with menopause and may prevent osteoporosis. Limited evidence indicates that red clover may offer some bone loss protection as well. Other herbal products that are commonly used during menopause lack enough evidence to support their use as a replacement for conventional menopause therapies. These products include evening primrose, ginseng, and red clover. WHAT ARE THE CASES WHEN HERBAL PRODUCTS SHOULD NOT BE USED DURING MENOPAUSE? Do not use herbal products during menopause without your health care provider's approval if:  You are taking medicine.  You have a preexisting liver condition. ARE THERE ANY RISKS IN MY TAKING HERBAL PRODUCTS DURING MENOPAUSE? If you  choose to use herbal products to help with symptoms of menopause, keep in mind that:  Different supplements have different and unmeasured amounts of herbal ingredients.  Herbal products are not regulated the same way that medicines are.  Concentrations of herbs may vary depending on the way they are prepared. For example, the concentration may be different in a pill, tea, oil, and tincture.  Little is known about the risks of using herbal products, particularly the risks of long-term use.  Some herbal supplements can be harmful when combined with certain medicines. Most commonly reported side effects of herbal products are mild. However, if used improperly, many herbal supplements can cause serious problems. Talk to your health care provider before starting any herbal product. If problems develop, stop taking the supplement and let your health care provider know.   This information is not intended to replace advice given to you by your health care provider. Make sure you discuss any questions you have with your health care provider.   Document Released: 01/26/2008 Document Revised: 08/30/2014 Document Reviewed: 01/22/2014 Elsevier Interactive Patient Education [employer].

## 2016-02-26 NOTE — Progress Notes (Signed)
50 y.o. G75P0000 Married Caucasian female here for annual exam.    Having vagina discharge and external itching.  No odor.  No OTC tx.   No more hot flashes for a few weeks.  Declines tx for this.   Some constipation.  Metamucil helps.  Does labs with PCP.   PCP:   Dr. Justin Mend.  Patient's last menstrual period was 11/25/2009.           Sexually active: Yes.   Female Partner.  The current method of family planning is status post hysterectomy.    Exercising: No.  The patient does not participate in regular exercise at present. Smoker:  no  Health Maintenance: Pap:  11/25/2008 WNL neg HR HPV History of abnormal Pap:  Yes. Years ago had f/u pap normal ever since. MMG:  10/21/15 BIRADS1 Colonoscopy: 05/02/15 - polyps - repeat 5-10 years BMD:   Never  Result  Never TDaP:  01/05/2013 Screening Labs:  Hb today: PCP, Urine today: Trace blood 8.0 pH. Asymptomatic.    reports that she has never smoked. She has never used smokeless tobacco. She reports that she drinks alcohol. She reports that she does not use illicit drugs.  Past Medical History  Diagnosis Date  . Hyperlipidemia   . Hypertension   . Anemia   . Migraine headache with aura 2012  . Hemorrhoids   . OCD (obsessive compulsive disorder)   . Fibroid   . Menorrhagia     Past Surgical History  Procedure Laterality Date  . Tonsillectomy    . Dilatation & currettage/hysteroscopy with resectocope  12/17/08    of polyps  . Dilation and curettage of uterus    . Hysteroscopy  12/17/08    resection polyps  . Robotic assisted total hysterectomy  11/25/09    simple hyperplasia, fibroids  . Laparoscopic appendectomy N/A 10/31/2015    Procedure: APPENDECTOMY LAPAROSCOPIC;  Surgeon: Erroll Luna, MD;  Location: [facility];  Service: General;  Laterality: N/A;    Current Outpatient Prescriptions  Medication Sig Dispense Refill  . buPROPion (WELLBUTRIN XL) 150 MG 24 hr tablet Take 150 mg by mouth daily.    . cetirizine (ZYRTEC) 10 MG  tablet Take 10 mg by mouth daily.    . Cholecalciferol (VITAMIN D PO) Take 5,000 Int'l Units by mouth daily.    . citalopram (CELEXA) 40 MG tablet Take 40 mg by mouth daily.    . fluticasone (FLONASE) 50 MCG/ACT nasal spray Place 2 sprays into both nostrils daily.     Marland Kitchen lisinopril-hydrochlorothiazide (PRINZIDE,ZESTORETIC) 20-12.5 MG per tablet Take 1 tablet by mouth daily.    . Misc Natural Products (OSTEO BI-FLEX JOINT SHIELD PO) Take 1 tablet by mouth daily.     . montelukast (SINGULAIR) 10 MG tablet     . Multiple Vitamins-Minerals (MULTIVITAMIN WITH MINERALS) tablet Take 1 tablet by mouth daily.     No current facility-administered medications for this visit.    Family History  Problem Relation Age of Onset  . Heart disease Maternal Grandmother   . Heart disease Maternal Grandfather   . Heart disease Father   . Hyperlipidemia Mother     ROS:  Pertinent items are noted in HPI.  Otherwise, a comprehensive ROS was negative.  Exam:   BP 120/68 mmHg  Pulse 82  Resp 16  Ht 5' 4.5" (1.638 m)  Wt 141 lb 12.8 oz (64.32 kg)  BMI 23.97 kg/m2  LMP 11/25/2009    General appearance: alert, cooperative and appears stated age  Head: Normocephalic, without obvious abnormality, atraumatic Neck: no adenopathy, supple, symmetrical, trachea midline and thyroid normal to inspection and palpation Lungs: clear to auscultation bilaterally Breasts: normal appearance, no masses or tenderness, Inspection negative, No nipple retraction or dimpling, No nipple discharge or bleeding, No axillary or supraclavicular adenopathy Heart: regular rate and rhythm Abdomen: incisions:  Yes.     laparoscopy , soft, non-tender; no masses, no organomegaly Extremities: extremities normal, atraumatic, no cyanosis or edema Skin: Skin color, texture, turgor normal. No rashes or lesions Lymph nodes: Cervical, supraclavicular, and axillary nodes normal. No abnormal inguinal nodes palpated Neurologic: Grossly  normal  Pelvic: External genitalia:   Mild erythema of the vulva.              Urethra:  normal appearing urethra with no masses, tenderness or lesions              Bartholins and Skenes: normal                 Vagina: normal appearing vagina with normal color and discharge, no lesions              Cervix: absent                Bimanual Exam:  Uterus:  uterus absent              Adnexa: normal adnexa and no mass, fullness, tenderness              Rectal exam: Yes.  .  Confirms.              Anus:  normal sphincter tone, no lesions  Chaperone was present for exam.  Assessment:   Well woman visit with normal exam. Vasomotor symptoms absent currently.  Vulvovaginitis.  Plan: Yearly mammogram recommended after age 60.  Recommended self breast exam.  Pap and HR HPV as above. Discussed Calcium, Vitamin D, regular exercise program including cardiovascular and weight bearing exercise. Labs performed.  Yes.  .   See orders. Affirm.  Prescription medication(s) given.  Yes.  .  See orders.  Mycolog II cream. Follow up annually and prn.      After visit summary provided.

## 2016-02-27 LAB — WET PREP BY MOLECULAR PROBE
CANDIDA SPECIES: NEGATIVE
Gardnerella vaginalis: NEGATIVE
Trichomonas vaginosis: NEGATIVE

## 2016-03-02 DIAGNOSIS — R109 Unspecified abdominal pain: Secondary | ICD-10-CM | POA: Diagnosis not present

## 2016-04-09 DIAGNOSIS — R739 Hyperglycemia, unspecified: Secondary | ICD-10-CM | POA: Diagnosis not present

## 2016-04-09 DIAGNOSIS — E7800 Pure hypercholesterolemia, unspecified: Secondary | ICD-10-CM | POA: Diagnosis not present

## 2016-04-27 DIAGNOSIS — M542 Cervicalgia: Secondary | ICD-10-CM | POA: Diagnosis not present

## 2016-04-30 DIAGNOSIS — S161XXA Strain of muscle, fascia and tendon at neck level, initial encounter: Secondary | ICD-10-CM | POA: Diagnosis not present

## 2016-05-27 DIAGNOSIS — M542 Cervicalgia: Secondary | ICD-10-CM | POA: Diagnosis not present

## 2016-05-27 DIAGNOSIS — M5412 Radiculopathy, cervical region: Secondary | ICD-10-CM | POA: Diagnosis not present

## 2016-06-03 DIAGNOSIS — Z23 Encounter for immunization: Secondary | ICD-10-CM | POA: Diagnosis not present

## 2016-08-02 DIAGNOSIS — E7800 Pure hypercholesterolemia, unspecified: Secondary | ICD-10-CM | POA: Diagnosis not present

## 2016-08-02 DIAGNOSIS — Z0000 Encounter for general adult medical examination without abnormal findings: Secondary | ICD-10-CM | POA: Diagnosis not present

## 2016-08-02 DIAGNOSIS — I10 Essential (primary) hypertension: Secondary | ICD-10-CM | POA: Diagnosis not present

## 2016-10-04 DIAGNOSIS — Z1231 Encounter for screening mammogram for malignant neoplasm of breast: Secondary | ICD-10-CM | POA: Diagnosis not present

## 2016-10-08 DIAGNOSIS — F410 Panic disorder [episodic paroxysmal anxiety] without agoraphobia: Secondary | ICD-10-CM | POA: Diagnosis not present

## 2016-10-29 DIAGNOSIS — E785 Hyperlipidemia, unspecified: Secondary | ICD-10-CM | POA: Insufficient documentation

## 2016-10-29 DIAGNOSIS — F410 Panic disorder [episodic paroxysmal anxiety] without agoraphobia: Secondary | ICD-10-CM | POA: Insufficient documentation

## 2016-10-29 DIAGNOSIS — S29012A Strain of muscle and tendon of back wall of thorax, initial encounter: Secondary | ICD-10-CM | POA: Diagnosis not present

## 2016-12-01 DIAGNOSIS — R350 Frequency of micturition: Secondary | ICD-10-CM | POA: Diagnosis not present

## 2016-12-14 DIAGNOSIS — L299 Pruritus, unspecified: Secondary | ICD-10-CM | POA: Diagnosis not present

## 2016-12-27 DIAGNOSIS — J069 Acute upper respiratory infection, unspecified: Secondary | ICD-10-CM | POA: Diagnosis not present

## 2017-02-28 ENCOUNTER — Ambulatory Visit (INDEPENDENT_AMBULATORY_CARE_PROVIDER_SITE_OTHER): Payer: BLUE CROSS/BLUE SHIELD | Admitting: Obstetrics and Gynecology

## 2017-02-28 ENCOUNTER — Encounter: Payer: Self-pay | Admitting: Obstetrics and Gynecology

## 2017-02-28 VITALS — BP 112/72 | HR 76 | Resp 16 | Ht 64.5 in | Wt 143.0 lb

## 2017-02-28 DIAGNOSIS — Z01419 Encounter for gynecological examination (general) (routine) without abnormal findings: Secondary | ICD-10-CM | POA: Diagnosis not present

## 2017-02-28 NOTE — Patient Instructions (Signed)

## 2017-02-28 NOTE — Progress Notes (Signed)
51 y.o. G6P0000 Married Caucasian female here for annual exam.    On and off has hot flashes.  None in months.  Some vaginal dryness.  Vagisil works for this.  No new partners.  Frequent urination.  No incontinence.  Saw urology.   Tried some medications and she had an allergic reaction.   Muscle and joint pain comes and goes.   Headaches.  Takes goody powder every day.  Took medication in the past and did not tolerate it well.   Rash on her left leg. Red rash comes and goes quickly.   Labs with PCP in December.   PCP:  Dr. Justin Mend  Psychiatry:  Dr. Toy Care  Patient's last menstrual period was 11/25/2009.           Sexually active: Yes.    Female partner.  The current method of family planning is status post hysterectomy.    Exercising: Yes.    weights Smoker:  no  Health Maintenance: Pap:  11/25/2008 WNL neg HR HPV History of abnormal Pap:  Yes. Years ago had f/u pap normal ever since MMG:  February 2018 per patient -- [facility] Colonoscopy:  05/02/15 - polyps - repeat 5-10 years BMD:   Never  Result  N/a TDaP:  2014 HIV:  Negative in er 74s. h Hep C:  NA.  Screening Labs: PCP takes care of labs   reports that she has never smoked. She has never used smokeless tobacco. She reports that she drinks alcohol. She reports that she does not use drugs.  Past Medical History:  Diagnosis Date  . Anemia   . Fibroid   . Hemorrhoids   . Hyperlipidemia   . Hypertension   . Menorrhagia   . Migraine headache with aura 2012  . OCD (obsessive compulsive disorder)     Past Surgical History:  Procedure Laterality Date  . DILATATION & CURRETTAGE/HYSTEROSCOPY WITH RESECTOCOPE  12/17/08   of polyps  . DILATION AND CURETTAGE OF UTERUS    . HYSTEROSCOPY  12/17/08   resection polyps  . LAPAROSCOPIC APPENDECTOMY N/A 10/31/2015   Procedure: APPENDECTOMY LAPAROSCOPIC;  Surgeon: Erroll Luna, MD;  Location: [facility];  Service: General;  Laterality: N/A;  . ROBOTIC ASSISTED TOTAL HYSTERECTOMY   11/25/09   simple hyperplasia, fibroids  . TONSILLECTOMY      Current Outpatient Prescriptions  Medication Sig Dispense Refill  . cetirizine (ZYRTEC) 10 MG tablet Take 10 mg by mouth daily.    . Cholecalciferol (VITAMIN D PO) Take 5,000 Int'l Units by mouth daily.    . citalopram (CELEXA) 40 MG tablet Take 40 mg by mouth daily.    . fluticasone (FLONASE) 50 MCG/ACT nasal spray Place 2 sprays into both nostrils daily.     Marland Kitchen lisinopril-hydrochlorothiazide (PRINZIDE,ZESTORETIC) 20-12.5 MG per tablet Take 1 tablet by mouth daily.    . Misc Natural Products (OSTEO BI-FLEX JOINT SHIELD PO) Take 1 tablet by mouth daily.     . Multiple Vitamins-Minerals (MULTIVITAMIN WITH MINERALS) tablet Take 1 tablet by mouth daily.    Marland Kitchen omeprazole (PRILOSEC) 40 MG capsule daily.    . montelukast (SINGULAIR) 10 MG tablet      No current facility-administered medications for this visit.     Family History  Problem Relation Age of Onset  . Heart disease Father   . Hyperlipidemia Mother   . Dementia Mother   . Heart disease Maternal Grandmother   . Heart disease Maternal Grandfather     ROS:  Pertinent items are noted  in HPI.  Otherwise, a comprehensive ROS was negative.  Exam:   BP 112/72 (BP Location: Right Arm, Patient Position: Sitting, Cuff Size: Normal)   Pulse 76   Resp 16   Ht 5' 4.5" (1.638 m)   Wt 143 lb (64.9 kg)   LMP 11/25/2009   BMI 24.17 kg/m     General appearance: alert, cooperative and appears stated age Head: Normocephalic, without obvious abnormality, atraumatic Neck: no adenopathy, supple, symmetrical, trachea midline and thyroid normal to inspection and palpation Lungs: clear to auscultation bilaterally Breasts: normal appearance, no masses or tenderness, No nipple retraction or dimpling, No nipple discharge or bleeding, No axillary or supraclavicular adenopathy Heart: regular rate and rhythm Abdomen: soft, non-tender; no masses, no organomegaly Extremities: extremities  normal, atraumatic, no cyanosis or edema Skin: Skin color, texture, turgor normal. No rashes or lesions Lymph nodes: Cervical, supraclavicular, and axillary nodes normal. No abnormal inguinal nodes palpated Neurologic: Grossly normal  Pelvic: External genitalia:  no lesions              Urethra:  normal appearing urethra with no masses, tenderness or lesions              Bartholins and Skenes: normal                 Vagina: normal appearing vagina with normal color and discharge, no lesions.  Rugae.               Cervix: absent.               Pap taken: No. Bimanual Exam:  Uterus:   Absent.               Adnexa: no mass, fullness, tenderness              Rectal exam: Yes.  .  Confirms.              Anus:  normal sphincter tone, no lesions  Chaperone was present for exam.  Assessment:   Well woman visit with normal exam. Status post hysterectomy.  Hx migraine with aura.  Daily use of Goody powder. Frequent urination.   Plan: Mammogram screening discussed. Recommended self breast awareness. Pap and HR HPV as above. Guidelines for Calcium, Vitamin D, regular exercise program including cardiovascular and weight bearing exercise. I cautioned her about risk of gastritis with daily use of Goody powder.  Return to PCP or Headache and [place]. I did mention to her physical therapy as a possibility for improved bladder function.  Follow up annually and prn.   After visit summary provided.

## 2017-05-12 ENCOUNTER — Encounter (INDEPENDENT_AMBULATORY_CARE_PROVIDER_SITE_OTHER): Payer: Self-pay | Admitting: Orthopaedic Surgery

## 2017-05-12 ENCOUNTER — Ambulatory Visit (INDEPENDENT_AMBULATORY_CARE_PROVIDER_SITE_OTHER): Payer: BLUE CROSS/BLUE SHIELD

## 2017-05-12 ENCOUNTER — Ambulatory Visit (INDEPENDENT_AMBULATORY_CARE_PROVIDER_SITE_OTHER): Payer: BLUE CROSS/BLUE SHIELD | Admitting: Orthopaedic Surgery

## 2017-05-12 DIAGNOSIS — M545 Low back pain, unspecified: Secondary | ICD-10-CM

## 2017-05-12 MED ORDER — PREDNISONE 10 MG (21) PO TBPK: ORAL_TABLET | ORAL | 0 refills | Status: DC

## 2017-05-12 MED ORDER — TIZANIDINE HCL 4 MG PO TABS: 4.0000 mg | ORAL_TABLET | Freq: Four times a day (QID) | ORAL | 2 refills | Status: DC | PRN

## 2017-05-12 NOTE — Progress Notes (Signed)
Office Visit Note   Patient: Kristina Dunlap           Date of Birth: [birth year]           MRN: [id number] Visit Date: 05/12/2017              Requested by: Maurice Small, MD [address] [place], [place] [address] PCP: Maurice Small, MD   Assessment & Plan: Visit Diagnoses:  1. Acute bilateral low back pain without sciatica     Plan: Overall impression is lumbar muscular strain. Prescription for prednisone taper and tizanidine. Advil 3 times a day. Referral to physical therapy made. Follow-up as needed. I do recommend that she stay out of work this week and next week.  Follow-Up Instructions: Return if symptoms worsen or fail to improve.   Orders:  Orders Placed This Encounter  Procedures  . XR Lumbar Spine 2-3 Views   Meds ordered this encounter  Medications  . tiZANidine (ZANAFLEX) 4 MG tablet    Sig: Take 1 tablet (4 mg total) by mouth every 6 (six) hours as needed for muscle spasms.    Dispense:  30 tablet    Refill:  2  . predniSONE (STERAPRED UNI-PAK 21 TAB) 10 MG (21) TBPK tablet    Sig: Take as directed    Dispense:  21 tablet    Refill:  0      Procedures: No procedures performed   Clinical Data: No additional findings.   Subjective: Chief Complaint  Patient presents with  . Lower Back - Pain, Follow-up    Patient is a 51 year old female who had an acute injury to her back this past Monday while reaching into her car and she felt a pop in her back. Denies any radicular symptoms. The pain is worse with lifting bending and twisting of her back. The pain is mainly on the right side of her back without any radiation or migration. Denies any numbness and tingling. Denies any constitutional symptoms.    Review of Systems  Constitutional: Negative.   HENT: Negative.   Eyes: Negative.   Respiratory: Negative.   Cardiovascular: Negative.   Endocrine: Negative.   Musculoskeletal: Negative.   Neurological: Negative.   Hematological:  Negative.   Psychiatric/Behavioral: Negative.   All other systems reviewed and are negative.    Objective: Vital Signs: LMP 11/25/2009   Physical Exam  Constitutional: She is oriented to person, place, and time. She appears well-developed and well-nourished.  HENT:  Head: Normocephalic and atraumatic.  Eyes: EOM are normal.  Neck: Neck supple.  Pulmonary/Chest: Effort normal.  Abdominal: Soft.  Neurological: She is alert and oriented to person, place, and time.  Skin: Skin is warm. Capillary refill takes less than 2 seconds.  Psychiatric: She has a normal mood and affect. Her behavior is normal. Judgment and thought content normal.  Nursing note and vitals reviewed.   Ortho Exam Lumbar spine exam shows mild tenderness in the paraspinal muscles. No radicular symptoms. No focal motor sensory deficits. Normal reflexes. Specialty Comments:  No specialty comments available.  Imaging: Xr Lumbar Spine 2-3 Views  Result Date: 05/12/2017 No acute or structural abnormalities    PMFS History: Patient Active Problem List   Diagnosis Date Noted  . Acute appendicitis 10/31/2015  . Hypertension   . Migraine headache with aura   . OCD (obsessive compulsive disorder)   . Hemorrhoids 08/30/2012   Past Medical History:  Diagnosis Date  . Anemia   . Fibroid   .  Hemorrhoids   . Hyperlipidemia   . Hypertension   . Menorrhagia   . Migraine headache with aura 2012  . OCD (obsessive compulsive disorder)     Family History  Problem Relation Age of Onset  . Heart disease Father   . Hyperlipidemia Mother   . Dementia Mother   . Heart disease Maternal Grandmother   . Heart disease Maternal Grandfather     Past Surgical History:  Procedure Laterality Date  . DILATATION & CURRETTAGE/HYSTEROSCOPY WITH RESECTOCOPE  12/17/08   of polyps  . DILATION AND CURETTAGE OF UTERUS    . HYSTEROSCOPY  12/17/08   resection polyps  . LAPAROSCOPIC APPENDECTOMY N/A 10/31/2015   Procedure:  APPENDECTOMY LAPAROSCOPIC;  Surgeon: Erroll Luna, MD;  Location: [facility];  Service: General;  Laterality: N/A;  . ROBOTIC ASSISTED TOTAL HYSTERECTOMY  11/25/09   simple hyperplasia, fibroids  . TONSILLECTOMY     Social History   Occupational History  . Warehouse worker Sales promotion account executive   Social History Main Topics  . Smoking status: Never Smoker  . Smokeless tobacco: Never Used  . Alcohol use 0.0 - 1.8 oz/week     Comment: beer  . Drug use: No  . Sexual activity: Yes    Partners: Female    Birth control/ protection: Surgical     Comment: R-TLH

## 2017-06-06 DIAGNOSIS — R350 Frequency of micturition: Secondary | ICD-10-CM | POA: Diagnosis not present

## 2017-06-08 DIAGNOSIS — N319 Neuromuscular dysfunction of bladder, unspecified: Secondary | ICD-10-CM | POA: Diagnosis not present

## 2017-06-08 DIAGNOSIS — R350 Frequency of micturition: Secondary | ICD-10-CM | POA: Diagnosis not present

## 2017-06-08 DIAGNOSIS — R3912 Poor urinary stream: Secondary | ICD-10-CM | POA: Diagnosis not present

## 2017-06-14 DIAGNOSIS — Z23 Encounter for immunization: Secondary | ICD-10-CM | POA: Diagnosis not present

## 2017-06-22 DIAGNOSIS — R3912 Poor urinary stream: Secondary | ICD-10-CM | POA: Diagnosis not present

## 2017-06-22 DIAGNOSIS — R350 Frequency of micturition: Secondary | ICD-10-CM | POA: Diagnosis not present

## 2017-06-22 DIAGNOSIS — M6281 Muscle weakness (generalized): Secondary | ICD-10-CM | POA: Diagnosis not present

## 2017-06-22 DIAGNOSIS — R278 Other lack of coordination: Secondary | ICD-10-CM | POA: Diagnosis not present

## 2017-06-28 ENCOUNTER — Ambulatory Visit (INDEPENDENT_AMBULATORY_CARE_PROVIDER_SITE_OTHER): Payer: BLUE CROSS/BLUE SHIELD | Admitting: Orthopaedic Surgery

## 2017-06-28 DIAGNOSIS — M25511 Pain in right shoulder: Secondary | ICD-10-CM

## 2017-06-28 DIAGNOSIS — M25522 Pain in left elbow: Secondary | ICD-10-CM | POA: Diagnosis not present

## 2017-06-28 MED ORDER — PREDNISONE 10 MG (21) PO TBPK: ORAL_TABLET | ORAL | 0 refills | Status: DC

## 2017-06-28 NOTE — Progress Notes (Signed)
Office Visit Note   Patient: Kristina Dunlap           Date of Birth: [birth year]           MRN: [id number] Visit Date: 06/28/2017              Requested by: Maurice Small, MD [address] [place], [place] [address] PCP: Maurice Small, MD   Assessment & Plan: Visit Diagnoses:  1. Acute pain of right shoulder   2. Pain in left elbow     Plan: Impression is left cubital tunnel neuritis and right rotator cuff tendinitis and overuse.  Prescription for prednisone.  If patient fails to improve may need to consider injection of the right shoulder.  Follow-Up Instructions: Return if symptoms worsen or fail to improve.   Orders:  No orders of the defined types were placed in this encounter.  Meds ordered this encounter  Medications  . predniSONE (STERAPRED UNI-PAK 21 TAB) 10 MG (21) TBPK tablet    Sig: Take as directed    Dispense:  21 tablet    Refill:  0      Procedures: No procedures performed   Clinical Data: No additional findings.   Subjective: No chief complaint on file.   Patient comes in today for 2 separate problems.  The first problem is the right shoulder which is radiating pain into her upper arm.  She denies any numbness and tingling.  The pain is worse with use of the shoulder and elevation.  Denies any injuries.  Her left elbow is tender over the cubital tunnel.  Denies any injuries.  Denies any numbness and tingling.  She does do repetitive work at her [employer].  She denies any numbness and tingling.    Review of Systems  Constitutional: Negative.   HENT: Negative.   Eyes: Negative.   Respiratory: Negative.   Cardiovascular: Negative.   Endocrine: Negative.   Musculoskeletal: Negative.   Neurological: Negative.   Hematological: Negative.   Psychiatric/Behavioral: Negative.   All other systems reviewed and are negative.    Objective: Vital Signs: LMP 11/25/2009   Physical Exam  Constitutional: She is oriented to person, place, and  time. She appears well-developed and well-nourished.  Pulmonary/Chest: Effort normal.  Neurological: She is alert and oriented to person, place, and time.  Skin: Skin is warm. Capillary refill takes less than 2 seconds.  Psychiatric: She has a normal mood and affect. Her behavior is normal. Judgment and thought content normal.  Nursing note and vitals reviewed.   Ortho Exam Left elbow exam shows tenderness in the cubital tunnel.  Ulnar nerve is stable.  Mildly positive Tinel's at the cubital tunnel.  No muscular atrophy or focal motor or sensory deficits.   Right shoulder exam shows mild impingement and discomfort with supraspinatus and infraspinatus testing. Specialty Comments:  No specialty comments available.  Imaging: No results found.   PMFS History: Patient Active Problem List   Diagnosis Date Noted  . Acute appendicitis 10/31/2015  . Hypertension   . Migraine headache with aura   . OCD (obsessive compulsive disorder)   . Hemorrhoids 08/30/2012   Past Medical History:  Diagnosis Date  . Anemia   . Fibroid   . Hemorrhoids   . Hyperlipidemia   . Hypertension   . Menorrhagia   . Migraine headache with aura 2012  . OCD (obsessive compulsive disorder)     Family History  Problem Relation Age of Onset  . Heart disease Father   .  Hyperlipidemia Mother   . Dementia Mother   . Heart disease Maternal Grandmother   . Heart disease Maternal Grandfather     Past Surgical History:  Procedure Laterality Date  . DILATATION & CURRETTAGE/HYSTEROSCOPY WITH RESECTOCOPE  12/17/08   of polyps  . DILATION AND CURETTAGE OF UTERUS    . HYSTEROSCOPY  12/17/08   resection polyps  . ROBOTIC ASSISTED TOTAL HYSTERECTOMY  11/25/09   simple hyperplasia, fibroids  . TONSILLECTOMY     Social History   Occupational History  . 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 Smoking status: Never Smoker  . Smokeless tobacco: Never Used  Substance and  Sexual Activity  . Alcohol use: Yes    Alcohol/week: 0.0 - 1.8 oz    Comment: beer  . Drug use: No  . Sexual activity: Yes    Partners: Female    Birth control/protection: Surgical    Comment: R-TLH

## 2017-07-11 DIAGNOSIS — M62838 Other muscle spasm: Secondary | ICD-10-CM | POA: Diagnosis not present

## 2017-07-11 DIAGNOSIS — M6281 Muscle weakness (generalized): Secondary | ICD-10-CM | POA: Diagnosis not present

## 2017-07-11 DIAGNOSIS — K5900 Constipation, unspecified: Secondary | ICD-10-CM | POA: Diagnosis not present

## 2017-07-11 DIAGNOSIS — R350 Frequency of micturition: Secondary | ICD-10-CM | POA: Diagnosis not present

## 2017-07-26 ENCOUNTER — Ambulatory Visit (INDEPENDENT_AMBULATORY_CARE_PROVIDER_SITE_OTHER): Payer: BLUE CROSS/BLUE SHIELD | Admitting: Orthopaedic Surgery

## 2017-07-26 ENCOUNTER — Ambulatory Visit (INDEPENDENT_AMBULATORY_CARE_PROVIDER_SITE_OTHER): Payer: Self-pay

## 2017-07-26 ENCOUNTER — Encounter (INDEPENDENT_AMBULATORY_CARE_PROVIDER_SITE_OTHER): Payer: Self-pay | Admitting: Orthopaedic Surgery

## 2017-07-26 DIAGNOSIS — M25511 Pain in right shoulder: Secondary | ICD-10-CM | POA: Diagnosis not present

## 2017-07-26 DIAGNOSIS — M25522 Pain in left elbow: Secondary | ICD-10-CM | POA: Diagnosis not present

## 2017-07-26 MED ORDER — METHYLPREDNISOLONE ACETATE 40 MG/ML IJ SUSP
40.0000 mg | INTRAMUSCULAR | Status: AC | PRN
  Administered 2017-07-26: 40 mg via INTRA_ARTICULAR

## 2017-07-26 MED ORDER — LIDOCAINE HCL 1 % IJ SOLN
3.0000 mL | INTRAMUSCULAR | Status: AC | PRN
  Administered 2017-07-26: 3 mL

## 2017-07-26 MED ORDER — BUPIVACAINE HCL 0.5 % IJ SOLN
3.0000 mL | INTRAMUSCULAR | Status: AC | PRN
  Administered 2017-07-26: 3 mL via INTRA_ARTICULAR

## 2017-07-26 NOTE — Progress Notes (Signed)
Office Visit Note   Patient: Kristina Dunlap           Date of Birth: 07-26-1966           MRN: [id number] Visit Date: 07/26/2017              Requested by: Maurice Small, MD [address] [place], [place] [address] PCP: Maurice Small, MD   Assessment & Plan: Visit Diagnoses:  1. Acute pain of right shoulder   2. Pain in left elbow     Plan: Impression is right shoulder bursitis versus rotator cuff tendinosis.  Subacromial injection was performed today.   referral to physical therapy for left elbow pain likely ECU strain.  Follow-up in 3 weeks for recheck. Total face to face encounter time was greater than 25 minutes and over half of this time was spent in counseling and/or coordination of care.   Follow-Up Instructions: Return in about 3 weeks (around 08/16/2017).   Orders:  Orders Placed This Encounter  Procedures  . XR Shoulder Right  . XR Elbow Complete Left (3+View)   No orders of the defined types were placed in this encounter.     Procedures: Large Joint Inj: R subacromial bursa on 07/26/2017 7:42 PM Indications: pain Details: 22 G needle  Arthrogram: No  Medications: 3 mL lidocaine 1 %; 3 mL bupivacaine 0.5 %; 40 mg methylPREDNISolone acetate 40 MG/ML Outcome: tolerated well, no immediate complications Consent was given by the patient. Patient was prepped and draped in the usual sterile fashion.       Clinical Data: No additional findings.   Subjective: Chief Complaint  Patient presents with  . Right Shoulder - Pain, Follow-up  . Left Elbow - Pain, Follow-up    Patient is following up today for her right shoulder and left elbow pain.  She states that she did feel well while she was on the oral prednisone but the pain is return since stopping the prednisone.  The right shoulder is especially worse.  Elbow continues to hurt along the course of the ECU muscle belly.    Review of Systems   Objective: Vital Signs: LMP 11/25/2009    Physical Exam  Ortho Exam Right shoulder exam shows intact rotator cuff with mild pain with supraspinatus testing.  No significant signs.  Left elbow exam shows tenderness along the ECU muscle belly. Specialty Comments:  No specialty comments available.  Imaging: No results found.   PMFS History: Patient Active Problem List   Diagnosis Date Noted  . Acute appendicitis 10/31/2015  . Hypertension   . Migraine headache with aura   . OCD (obsessive compulsive disorder)   . Hemorrhoids 08/30/2012   Past Medical History:  Diagnosis Date  . Anemia   . Fibroid   . Hemorrhoids   . Hyperlipidemia   . Hypertension   . Menorrhagia   . Migraine headache with aura 2012  . OCD (obsessive compulsive disorder)     Family History  Problem Relation Age of Onset  . Heart disease Father   . Hyperlipidemia Mother   . Dementia Mother   . Heart disease Maternal Grandmother   . Heart disease Maternal Grandfather     Past Surgical History:  Procedure Laterality Date  . DILATATION & CURRETTAGE/HYSTEROSCOPY WITH RESECTOCOPE  12/17/08   of polyps  . DILATION AND CURETTAGE OF UTERUS    . HYSTEROSCOPY  12/17/08   resection polyps  . LAPAROSCOPIC APPENDECTOMY N/A 10/31/2015   Procedure: APPENDECTOMY LAPAROSCOPIC;  Surgeon: Erroll Luna, MD;  Location: [facility];  Service: General;  Laterality: N/A;  . ROBOTIC ASSISTED TOTAL HYSTERECTOMY  11/25/09   simple hyperplasia, fibroids  . TONSILLECTOMY     Social History   Occupational History  . 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 Smoking status: Never Smoker  . Smokeless tobacco: Never Used  Substance and Sexual Activity  . Alcohol use: Yes    Alcohol/week: 0.0 - 1.8 oz    Comment: beer  . Drug use: No  . Sexual activity: Yes    Partners: Female    Birth control/protection: Surgical    Comment: R-TLH

## 2017-07-28 DIAGNOSIS — M62838 Other muscle spasm: Secondary | ICD-10-CM | POA: Diagnosis not present

## 2017-07-28 DIAGNOSIS — K5900 Constipation, unspecified: Secondary | ICD-10-CM | POA: Diagnosis not present

## 2017-07-28 DIAGNOSIS — R350 Frequency of micturition: Secondary | ICD-10-CM | POA: Diagnosis not present

## 2017-07-28 DIAGNOSIS — M6281 Muscle weakness (generalized): Secondary | ICD-10-CM | POA: Diagnosis not present

## 2017-08-10 DIAGNOSIS — Z0000 Encounter for general adult medical examination without abnormal findings: Secondary | ICD-10-CM | POA: Diagnosis not present

## 2017-08-10 DIAGNOSIS — E7800 Pure hypercholesterolemia, unspecified: Secondary | ICD-10-CM | POA: Diagnosis not present

## 2017-08-10 DIAGNOSIS — I10 Essential (primary) hypertension: Secondary | ICD-10-CM | POA: Diagnosis not present

## 2017-08-18 DIAGNOSIS — M25522 Pain in left elbow: Secondary | ICD-10-CM | POA: Diagnosis not present

## 2017-08-19 DIAGNOSIS — M6281 Muscle weakness (generalized): Secondary | ICD-10-CM | POA: Diagnosis not present

## 2017-08-19 DIAGNOSIS — K5900 Constipation, unspecified: Secondary | ICD-10-CM | POA: Diagnosis not present

## 2017-08-19 DIAGNOSIS — R350 Frequency of micturition: Secondary | ICD-10-CM | POA: Diagnosis not present

## 2017-08-19 DIAGNOSIS — M62838 Other muscle spasm: Secondary | ICD-10-CM | POA: Diagnosis not present

## 2017-08-23 DIAGNOSIS — K259 Gastric ulcer, unspecified as acute or chronic, without hemorrhage or perforation: Secondary | ICD-10-CM

## 2017-08-23 HISTORY — DX: Gastric ulcer, unspecified as acute or chronic, without hemorrhage or perforation: K25.9

## 2017-09-22 DIAGNOSIS — M6281 Muscle weakness (generalized): Secondary | ICD-10-CM | POA: Diagnosis not present

## 2017-09-22 DIAGNOSIS — M62838 Other muscle spasm: Secondary | ICD-10-CM | POA: Diagnosis not present

## 2017-09-22 DIAGNOSIS — R350 Frequency of micturition: Secondary | ICD-10-CM | POA: Diagnosis not present

## 2017-09-22 DIAGNOSIS — K5900 Constipation, unspecified: Secondary | ICD-10-CM | POA: Diagnosis not present

## 2017-10-12 DIAGNOSIS — Z1231 Encounter for screening mammogram for malignant neoplasm of breast: Secondary | ICD-10-CM | POA: Diagnosis not present

## 2017-10-26 DIAGNOSIS — L299 Pruritus, unspecified: Secondary | ICD-10-CM | POA: Diagnosis not present

## 2017-10-31 ENCOUNTER — Other Ambulatory Visit: Payer: Self-pay

## 2017-10-31 ENCOUNTER — Encounter (HOSPITAL_COMMUNITY): Payer: Self-pay | Admitting: Emergency Medicine

## 2017-10-31 ENCOUNTER — Emergency Department (HOSPITAL_COMMUNITY)
Admission: EM | Admit: 2017-10-31 | Discharge: 2017-10-31 | Disposition: A | Discharge status: Home / Self Care | Payer: BLUE CROSS/BLUE SHIELD | Attending: Emergency Medicine | Admitting: Emergency Medicine

## 2017-10-31 DIAGNOSIS — R55 Syncope and collapse: Secondary | ICD-10-CM | POA: Diagnosis not present

## 2017-10-31 DIAGNOSIS — Z79899 Other long term (current) drug therapy: Secondary | ICD-10-CM | POA: Insufficient documentation

## 2017-10-31 DIAGNOSIS — R197 Diarrhea, unspecified: Secondary | ICD-10-CM | POA: Insufficient documentation

## 2017-10-31 DIAGNOSIS — I10 Essential (primary) hypertension: Secondary | ICD-10-CM | POA: Insufficient documentation

## 2017-10-31 DIAGNOSIS — R112 Nausea with vomiting, unspecified: Secondary | ICD-10-CM | POA: Diagnosis not present

## 2017-10-31 LAB — COMPREHENSIVE METABOLIC PANEL
ALT: 11 U/L — AB (ref 14–54)
AST: 23 U/L (ref 15–41)
Albumin: 3.7 g/dL (ref 3.5–5.0)
Alkaline Phosphatase: 66 U/L (ref 38–126)
Anion gap: 12 (ref 5–15)
BUN: 27 mg/dL — AB (ref 6–20)
CHLORIDE: 99 mmol/L — AB (ref 101–111)
CO2: 25 mmol/L (ref 22–32)
CREATININE: 0.72 mg/dL (ref 0.44–1.00)
Calcium: 8.9 mg/dL (ref 8.9–10.3)
GFR calc Af Amer: >60 mL/min (ref >60)
GFR calc non Af Amer: >60 mL/min (ref >60)
Glucose, Bld: 176 mg/dL — ABNORMAL HIGH (ref 65–99)
Potassium: 3.6 mmol/L (ref 3.5–5.1)
SODIUM: 136 mmol/L (ref 135–145)
Total Bilirubin: 0.4 mg/dL (ref 0.3–1.2)
Total Protein: 6.2 g/dL — ABNORMAL LOW (ref 6.5–8.1)

## 2017-10-31 LAB — CBC WITH DIFFERENTIAL/PLATELET
Basophils Absolute: 0 10*3/uL (ref 0.0–0.1)
Basophils Relative: 0 %
EOS PCT: 0 %
Eosinophils Absolute: 0 10*3/uL (ref 0.0–0.7)
HCT: 30.1 % — ABNORMAL LOW (ref 36.0–46.0)
Hemoglobin: 10.1 g/dL — ABNORMAL LOW (ref 12.0–15.0)
LYMPHS ABS: 1.1 10*3/uL (ref 0.7–4.0)
LYMPHS PCT: 14 %
MCH: 29.8 pg (ref 26.0–34.0)
MCHC: 33.6 g/dL (ref 30.0–36.0)
MCV: 88.8 fL (ref 78.0–100.0)
MONO ABS: 0.4 10*3/uL (ref 0.1–1.0)
Monocytes Relative: 5 %
Neutro Abs: 6.3 10*3/uL (ref 1.7–7.7)
Neutrophils Relative %: 81 %
PLATELETS: 310 10*3/uL (ref 150–400)
RBC: 3.39 MIL/uL — AB (ref 3.87–5.11)
RDW: 12.5 % (ref 11.5–15.5)
WBC: 7.8 10*3/uL (ref 4.0–10.5)

## 2017-10-31 LAB — LIPASE, BLOOD: Lipase: 25 U/L (ref 11–51)

## 2017-10-31 MED ORDER — SODIUM CHLORIDE 0.9 % IV BOLUS (SEPSIS)
1000.0000 mL | Freq: Once | INTRAVENOUS | Status: AC
  Administered 2017-10-31: 1000 mL via INTRAVENOUS

## 2017-10-31 MED ORDER — SODIUM CHLORIDE 0.9 % IV SOLN
Freq: Once | INTRAVENOUS | Status: AC
  Administered 2017-10-31: 14:00:00 via INTRAVENOUS

## 2017-10-31 MED ORDER — FAMOTIDINE IN NACL 20-0.9 MG/50ML-% IV SOLN
20.0000 mg | Freq: Once | INTRAVENOUS | Status: AC
  Administered 2017-10-31: 20 mg via INTRAVENOUS
  Filled 2017-10-31: qty 50

## 2017-10-31 MED ORDER — ONDANSETRON HCL 4 MG/2ML IJ SOLN
4.0000 mg | Freq: Once | INTRAMUSCULAR | Status: AC
  Administered 2017-10-31: 4 mg via INTRAVENOUS
  Filled 2017-10-31: qty 2

## 2017-10-31 MED ORDER — SODIUM CHLORIDE 0.9 % IV SOLN
1000.0000 mL | INTRAVENOUS | Status: DC
  Administered 2017-10-31: 1000 mL via INTRAVENOUS

## 2017-10-31 MED ORDER — ONDANSETRON 4 MG PO TBDP: 4.0000 mg | ORAL_TABLET | ORAL | 0 refills | Status: DC | PRN

## 2017-10-31 MED ORDER — ACETAMINOPHEN 500 MG PO TABS
1000.0000 mg | ORAL_TABLET | Freq: Once | ORAL | Status: AC
  Administered 2017-10-31: 1000 mg via ORAL
  Filled 2017-10-31: qty 2

## 2017-10-31 MED ORDER — PROMETHAZINE HCL 25 MG RE SUPP: 25.0000 mg | Freq: Four times a day (QID) | RECTAL | 0 refills | Status: DC | PRN

## 2017-10-31 MED ORDER — FAMOTIDINE 20 MG PO TABS: 20.0000 mg | ORAL_TABLET | Freq: Two times a day (BID) | ORAL | 0 refills | Status: DC

## 2017-10-31 MED ORDER — PANTOPRAZOLE SODIUM 40 MG IV SOLR
40.0000 mg | Freq: Once | INTRAVENOUS | Status: AC
  Administered 2017-10-31: 40 mg via INTRAVENOUS
  Filled 2017-10-31: qty 40

## 2017-10-31 MED ORDER — PANTOPRAZOLE SODIUM 20 MG PO TBEC: 20.0000 mg | DELAYED_RELEASE_TABLET | Freq: Every day | ORAL | 0 refills | Status: DC

## 2017-10-31 NOTE — ED Provider Notes (Signed)
[facility] EMERGENCY DEPARTMENT Provider Note   CSN: [id number] Arrival date & time: 10/31/17  5638     History   Chief Complaint Chief Complaint  Patient presents with  . Emesis    HPI Kristina Dunlap is a 52 y.o. female.  HPI Patient reports she had pretty sudden onset of vomiting and diarrhea illness last night.  She has had multiple episodes of vomiting and diarrhea.  She got up in the middle the night about 1 AM to going to the bathroom and started to get weak and clammy.  She reports that she passed out but did not have any injury.  She was able to then get back up and get into bed again.  She reports she continued to feel weak and fatigued.  She had several more episodes of vomiting and diarrhea but that stopped around the early morning hours.  She reports after the first several episodes of vomiting she did notice some streaking blood in the emesis.  Reports she continued however to feel very weak and lightheaded. Past Medical History:  Diagnosis Date  . Anemia   . Fibroid   . Hemorrhoids   . Hyperlipidemia   . Hypertension   . Menorrhagia   . Migraine headache with aura 2012  . OCD (obsessive compulsive disorder)     Patient Active Problem List   Diagnosis Date Noted  . Acute appendicitis 10/31/2015  . Hypertension   . Migraine headache with aura   . OCD (obsessive compulsive disorder)   . Hemorrhoids 08/30/2012    Past Surgical History:  Procedure Laterality Date  . DILATATION & CURRETTAGE/HYSTEROSCOPY WITH RESECTOCOPE  12/17/08   of polyps  . DILATION AND CURETTAGE OF UTERUS    . HYSTEROSCOPY  12/17/08   resection polyps  . LAPAROSCOPIC APPENDECTOMY N/A 10/31/2015   Procedure: APPENDECTOMY LAPAROSCOPIC;  Surgeon: Erroll Luna, MD;  Location: [facility];  Service: General;  Laterality: N/A;  . ROBOTIC ASSISTED TOTAL HYSTERECTOMY  11/25/09   simple hyperplasia, fibroids  . TONSILLECTOMY      OB History    Gravida Para Term Preterm AB Living   0 0 0 0 0 0   SAB TAB Ectopic Multiple Live Births   0 0 0 0         Home Medications    Prior to Admission medications   Medication Sig Start Date End Date Taking? Authorizing Provider  Aspirin-Acetaminophen-Caffeine (GOODY HEADACHE PO) Take 1 Package by mouth as needed (pain).   Yes [provider]  Camphor-Menthol (TIGER BALM EXTRA STRENGTH EX) Apply 1 application topically as needed (muscle pain).   Yes [provider]  cetirizine (ZYRTEC) 10 MG tablet Take 10 mg by mouth daily.   Yes [provider]  Cholecalciferol (VITAMIN D PO) Take 5,000 Int'l Units by mouth daily.   Yes [provider]  citalopram (CELEXA) 40 MG tablet Take 40 mg by mouth daily.   Yes [provider]  fluticasone (FLONASE) 50 MCG/ACT nasal spray Place 2 sprays into both nostrils daily.  01/25/14  Yes [provider]  lisinopril-hydrochlorothiazide (PRINZIDE,ZESTORETIC) 20-12.5 MG per tablet Take 1 tablet by mouth daily.   Yes [provider]  Menthol, Topical Analgesic, (ICY HOT) 16 % LIQD Apply 1 application topically as needed (muscle pain).   Yes [provider]  Misc Natural Products (OSTEO BI-FLEX JOINT SHIELD PO) Take 1 tablet by mouth daily.    Yes [provider]  Multiple Vitamins-Minerals (MULTIVITAMIN WITH MINERALS)  tablet Take 1 tablet by mouth daily.   Yes [provider]  Propylene Glycol (SYSTANE BALANCE OP) Apply 1 drop to eye as needed (dry eyes).   Yes [provider]  simethicone (MYLICON) 595 MG chewable tablet Chew 125 mg by mouth as needed for flatulence.   Yes [provider]  triamcinolone cream (KENALOG) 0.1 % Apply 1 application topically 2 (two) times daily. 10/26/17  Yes [provider]  famotidine (PEPCID) 20 MG tablet Take 1 tablet (20 mg total) by mouth 2 (two) times daily. 10/31/17   Charlesetta Shanks, MD  ondansetron (ZOFRAN ODT) 4 MG disintegrating tablet Take 1 tablet (4  mg total) by mouth every 4 (four) hours as needed for nausea or vomiting. 10/31/17   Charlesetta Shanks, MD  pantoprazole (PROTONIX) 20 MG tablet Take 1 tablet (20 mg total) by mouth daily. 10/31/17   Charlesetta Shanks, MD  predniSONE (STERAPRED UNI-PAK 21 TAB) 10 MG (21) TBPK tablet Take as directed Patient not taking: Reported on 07/26/2017 06/28/17   Leandrew Koyanagi, MD  promethazine (PHENERGAN) 25 MG suppository Place 1 suppository (25 mg total) rectally every 6 (six) hours as needed for nausea or vomiting. 10/31/17   Charlesetta Shanks, MD    Family History Family History  Problem Relation Age of Onset  . Heart disease Father   . Hyperlipidemia Mother   . Dementia Mother   . Heart disease Maternal Grandmother   . Heart disease Maternal Grandfather     Social History Social History   Tobacco Use  . Smoking status: Never Smoker  . Smokeless tobacco: Never Used  Substance Use Topics  . Alcohol use: Yes    Alcohol/week: 0.0 - 1.8 oz    Comment: beer  . Drug use: No     Allergies   Erythromycin   Review of Systems Review of Systems 10 Systems reviewed and are negative for acute change except as noted in the HPI.   Physical Exam Updated Vital Signs BP 114/72 (BP Location: Right Arm)   Pulse 90   Resp 20   LMP 11/25/2009   SpO2 99%   Physical Exam  Constitutional: She is oriented to person, place, and time. She appears well-developed and well-nourished. No distress.  Patient is alert and nontoxic.  Mental status clear.  No respiratory distress.  HENT:  Head: Normocephalic and atraumatic.  Nose: Nose normal.  Mouth/Throat: Oropharynx is clear and moist.  Eyes: Conjunctivae and EOM are normal. Pupils are equal, round, and reactive to light.  Neck: Neck supple.  Cardiovascular: Regular rhythm.  No murmur heard. Borderline tachycardia.  Regular.  Pulmonary/Chest: Effort normal and breath sounds normal. No respiratory distress.  Abdominal: Soft. She exhibits no distension.  There is no tenderness. There is no guarding.  Musculoskeletal: Normal range of motion. She exhibits no edema or tenderness.  Neurological: She is alert and oriented to person, place, and time. No cranial nerve deficit. She exhibits normal muscle tone. Coordination normal.  Skin: Skin is warm and dry.  Psychiatric: She has a normal mood and affect.  Nursing note and vitals reviewed.    ED Treatments / Results  Labs (all labs ordered are listed, but only abnormal results are displayed) Labs Reviewed  COMPREHENSIVE METABOLIC PANEL - Abnormal; Notable for the following components:      Result Value   Chloride 99 (*)    Glucose, Bld 176 (*)    BUN 27 (*)    Total Protein 6.2 (*)    ALT  11 (*)    All other components within normal limits  CBC WITH DIFFERENTIAL/PLATELET - Abnormal; Notable for the following components:   RBC 3.39 (*)    Hemoglobin 10.1 (*)    HCT 30.1 (*)    All other components within normal limits  LIPASE, BLOOD  URINALYSIS, ROUTINE W REFLEX MICROSCOPIC    EKG  EKG Interpretation  Date/Time:  Monday October 31 2017 09:37:54 EDT Ventricular Rate:  122 PR Interval:  126 QRS Duration: 70 QT Interval:  308 QTC Calculation: 438 R Axis:   50 Text Interpretation:  Sinus tachycardia Nonspecific ST and T wave abnormality Abnormal ECG agree. likely rate related ST depression c/w previous Confirmed by Charlesetta Shanks [phone]) on 10/31/2017 12:14:57 PM       Radiology No results found.  Procedures Procedures (including critical care time)  Medications Ordered in ED Medications  sodium chloride 0.9 % bolus 1,000 mL (0 mLs Intravenous Stopped 10/31/17 1542)    Followed by  sodium chloride 0.9 % bolus 1,000 mL (0 mLs Intravenous Stopped 10/31/17 1542)    Followed by  0.9 %  sodium chloride infusion (0 mLs Intravenous Stopped 10/31/17 1542)  ondansetron (ZOFRAN) injection 4 mg (4 mg Intravenous Given 10/31/17 1302)  famotidine (PEPCID) IVPB 20 mg premix (0 mg  Intravenous Stopped 10/31/17 1354)  pantoprazole (PROTONIX) injection 40 mg (40 mg Intravenous Given 10/31/17 1301)  acetaminophen (TYLENOL) tablet 1,000 mg (1,000 mg Oral Given 10/31/17 1402)  0.9 %  sodium chloride infusion ( Intravenous Stopped 10/31/17 1542)     Initial Impression / Assessment and Plan / ED Course  I have reviewed the triage vital signs and the nursing notes.  Pertinent labs & imaging results that were available during my care of the patient were reviewed by me and considered in my medical decision making (see chart for details).      Final Clinical Impressions(s) / ED Diagnoses   Final diagnoses:  Nausea vomiting and diarrhea  Near syncope  Patient presents with onset of vomiting and diarrhea with weakness and dehydration.  Patient's abdominal examination is nonsurgical.  She has no guarding.  With rehydration patient felt significantly improved.  Status is alert and clear.  Patient has chronic stable anemia, no change from last year this time.  Consistent with some dehydration.  At this time I have high suspicion for infectious gastroenteritis.  Patient symptoms have improved.  She has not had further vomiting and diarrhea since earlier morning hours.  She has responded very well to fluids and symptomatic treatment.  Plan for continuing PPI and Pepcid with Zofran and Phenergan reviewed.  Patient will continue to sip fluids.  Return precautions reviewed.  ED Discharge Orders        Ordered    famotidine (PEPCID) 20 MG tablet  2 times daily     10/31/17 1520    pantoprazole (PROTONIX) 20 MG tablet  Daily     10/31/17 1520    ondansetron (ZOFRAN ODT) 4 MG disintegrating tablet  Every 4 hours PRN     10/31/17 1520    promethazine (PHENERGAN) 25 MG suppository  Every 6 hours PRN     10/31/17 1520       Charlesetta Shanks, MD 10/31/17 1638

## 2017-10-31 NOTE — Discharge Instructions (Signed)
1.  Take Protonix daily for the next 2 weeks.  Take Pepcid twice daily for the next week.  Take Zofran if needed for nausea every 4 hours under the tongue.  If you still have nausea, use Phenergan rectal suppository every 6-8 hours.  Keep fluids with electrolytes at your bedside.  Take small sips multiple times per hour. 2.  Your symptoms should be improving within the next 24-48 hours.  If you cannot tolerate fluids, continued to have repeated vomiting, develop lightheadedness dizziness or near passing out you must return to the emergency department for recheck. 3.  Follow-up with your family doctor within the next 2-3 days for recheck.

## 2017-10-31 NOTE — ED Triage Notes (Signed)
Pt her\e from home with c/o weakness and vomiting and syncopal episode

## 2017-10-31 NOTE — ED Notes (Signed)
Pt ambulated to restroom without complication. RN walked with pt. No dizziness noted.

## 2017-10-31 NOTE — ED Notes (Signed)
Pt stated felt dizzy while standing, only got one set of ortho vitals. Notified Michael(RN)

## 2017-11-02 DIAGNOSIS — K2901 Acute gastritis with bleeding: Secondary | ICD-10-CM | POA: Diagnosis not present

## 2017-11-08 DIAGNOSIS — F422 Mixed obsessional thoughts and acts: Secondary | ICD-10-CM | POA: Diagnosis not present

## 2017-11-21 DIAGNOSIS — R140 Abdominal distension (gaseous): Secondary | ICD-10-CM | POA: Diagnosis not present

## 2017-11-21 DIAGNOSIS — R12 Heartburn: Secondary | ICD-10-CM | POA: Diagnosis not present

## 2017-11-21 DIAGNOSIS — K219 Gastro-esophageal reflux disease without esophagitis: Secondary | ICD-10-CM | POA: Diagnosis not present

## 2017-11-21 DIAGNOSIS — R1013 Epigastric pain: Secondary | ICD-10-CM | POA: Diagnosis not present

## 2017-12-16 DIAGNOSIS — R739 Hyperglycemia, unspecified: Secondary | ICD-10-CM | POA: Diagnosis not present

## 2017-12-16 DIAGNOSIS — R51 Headache: Secondary | ICD-10-CM | POA: Diagnosis not present

## 2017-12-16 DIAGNOSIS — K920 Hematemesis: Secondary | ICD-10-CM | POA: Diagnosis not present

## 2017-12-16 DIAGNOSIS — D649 Anemia, unspecified: Secondary | ICD-10-CM | POA: Diagnosis not present

## 2017-12-16 DIAGNOSIS — R202 Paresthesia of skin: Secondary | ICD-10-CM | POA: Diagnosis not present

## 2017-12-30 DIAGNOSIS — K219 Gastro-esophageal reflux disease without esophagitis: Secondary | ICD-10-CM | POA: Diagnosis not present

## 2017-12-30 DIAGNOSIS — K2930 Chronic superficial gastritis without bleeding: Secondary | ICD-10-CM | POA: Diagnosis not present

## 2017-12-30 DIAGNOSIS — K2900 Acute gastritis without bleeding: Secondary | ICD-10-CM | POA: Diagnosis not present

## 2017-12-30 DIAGNOSIS — R1013 Epigastric pain: Secondary | ICD-10-CM | POA: Diagnosis not present

## 2017-12-30 DIAGNOSIS — K254 Chronic or unspecified gastric ulcer with hemorrhage: Secondary | ICD-10-CM | POA: Diagnosis not present

## 2017-12-30 DIAGNOSIS — R12 Heartburn: Secondary | ICD-10-CM | POA: Diagnosis not present

## 2017-12-30 DIAGNOSIS — K920 Hematemesis: Secondary | ICD-10-CM | POA: Diagnosis not present

## 2018-01-03 ENCOUNTER — Encounter: Payer: Self-pay | Admitting: Neurology

## 2018-01-05 DIAGNOSIS — K2930 Chronic superficial gastritis without bleeding: Secondary | ICD-10-CM | POA: Diagnosis not present

## 2018-01-18 DIAGNOSIS — L298 Other pruritus: Secondary | ICD-10-CM | POA: Diagnosis not present

## 2018-01-25 ENCOUNTER — Ambulatory Visit (INDEPENDENT_AMBULATORY_CARE_PROVIDER_SITE_OTHER): Payer: BLUE CROSS/BLUE SHIELD | Admitting: Orthopaedic Surgery

## 2018-01-25 ENCOUNTER — Encounter (INDEPENDENT_AMBULATORY_CARE_PROVIDER_SITE_OTHER): Payer: Self-pay | Admitting: Orthopaedic Surgery

## 2018-01-25 ENCOUNTER — Ambulatory Visit (INDEPENDENT_AMBULATORY_CARE_PROVIDER_SITE_OTHER): Payer: Self-pay

## 2018-01-25 DIAGNOSIS — M25511 Pain in right shoulder: Secondary | ICD-10-CM

## 2018-01-25 DIAGNOSIS — G8929 Other chronic pain: Secondary | ICD-10-CM | POA: Diagnosis not present

## 2018-01-25 DIAGNOSIS — M79671 Pain in right foot: Secondary | ICD-10-CM | POA: Diagnosis not present

## 2018-01-25 NOTE — Progress Notes (Signed)
Office Visit Note   Patient: Kristina Dunlap           Date of Birth: Sep 18, 1965           MRN: [id number] Visit Date: 01/25/2018              Requested by: Maurice Small, MD [place],  [id number] PCP: Maurice Small, MD   Assessment & Plan: Visit Diagnoses:  1. Pain in right foot   2. Chronic right shoulder pain     Plan: Impression is right shoulder rotator cuff tendinosis versus tear.  #2 right foot Morton's neuroma between the first and second metatarsal heads.  In regards to the right shoulder, we will go ahead and obtain an MRI.  She will follow-up with [place] once this is been completed.  In regards to the right foot, we injected this with cortisone.    Follow-Up Instructions: Return in about 2 weeks (around 02/08/2018).   Orders:  Orders Placed This Encounter  Procedures  . Foot Inj  . XR Foot Complete Right  . MR SHOULDER RIGHT WO CONTRAST   No orders of the defined types were placed in this encounter.     Procedures: Foot Inj Date/Time: 01/25/2018 4:34 PM Performed by: Aundra Dubin, PA-C Authorized by: Aundra Dubin, PA-C   Condition: Morton's Neuroma   Location:  R foot Medications:  0.5 mL lidocaine 1 %; 13.33 mg methylPREDNISolone acetate 40 MG/ML; 0.33 mL bupivacaine 0.25 %     Clinical Data: No additional findings.   Subjective: Chief Complaint  Patient presents with  . Right Shoulder - Pain  . Right Foot - Pain    HPI patient is a pleasant 52 year old female presents to our clinic today with continued right shoulder pain as well as right foot pain.  Regards to her right shoulder, she has had pain here for over 6 months.  No known injury or change in activity.  She was seen in our office several months back where she was given a subacromial cortisone injection.  This moderately helped but only lasted a few days.  Her pain has returned.  The pain is primarily to the deltoid worse with abduction.  No numbness, tingling  or burning to the right upper extremity.  In regards to the right foot, she has had pain around the second metatarsal for about 2 years.  No known injury or change in activity.  At times, she feels as though she is walking on a marble.  No previous cortisone injection there.  Review of Systems as detailed in HPI.  All others reviewed and are negative.   Objective: Vital Signs: LMP 11/25/2009   Physical Exam well-developed well-nourished female no acute distress.  Alert and oriented x3.  Ortho Exam examination of the right shoulder reveals 80% active range of motion all planes.  She can internally rotate to L5.  Positive empty can.  Examination of the right foot shows no swelling.  No tenderness over the first or second metatarsals.  She does have tenderness between the first and second greater than the second and third metatarsal heads.  Full range of motion of the foot and toes.  Specialty Comments:  No specialty comments available.  Imaging: Xr Foot Complete Right  Result Date: 01/25/2018 No acute or structural abnormalities    PMFS History: Patient Active Problem List   Diagnosis Date Noted  . Pain in right foot 01/25/2018  . Chronic right shoulder pain 01/25/2018  .  Acute appendicitis 10/31/2015  . Hypertension   . Migraine headache with aura   . OCD (obsessive compulsive disorder)   . Hemorrhoids 08/30/2012   Past Medical History:  Diagnosis Date  . Anemia   . Fibroid   . Hemorrhoids   . Hyperlipidemia   . Hypertension   . Menorrhagia   . Migraine headache with aura 2012  . OCD (obsessive compulsive disorder)     Family History  Problem Relation Age of Onset  . Heart disease Father   . Hyperlipidemia Mother   . Dementia Mother   . Heart disease Maternal Grandmother   . Heart disease Maternal Grandfather     Past Surgical History:  Procedure Laterality Date  . DILATATION & CURRETTAGE/HYSTEROSCOPY WITH RESECTOCOPE  12/17/08   of polyps  . DILATION AND  CURETTAGE OF UTERUS    . HYSTEROSCOPY  12/17/08   resection polyps  . LAPAROSCOPIC APPENDECTOMY N/A 10/31/2015   Procedure: APPENDECTOMY LAPAROSCOPIC;  Surgeon: Erroll Luna, MD;  Location: [facility];  Service: General;  Laterality: N/A;  . ROBOTIC ASSISTED TOTAL HYSTERECTOMY  11/25/09   simple hyperplasia, fibroids  . TONSILLECTOMY     Social History   Occupational History  . 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 Smoking status: Never Smoker  . Smokeless tobacco: Never Used  Substance and Sexual Activity  . Alcohol use: Yes    Alcohol/week: 0.0 - 1.8 oz    Comment: beer  . Drug use: No  . Sexual activity: Yes    Partners: Female    Birth control/protection: Surgical    Comment: R-TLH

## 2018-01-27 MED ORDER — LIDOCAINE HCL 1 % IJ SOLN
0.5000 mL | INTRAMUSCULAR | Status: AC | PRN
  Administered 2018-01-25: .5 mL

## 2018-01-27 MED ORDER — METHYLPREDNISOLONE ACETATE 40 MG/ML IJ SUSP
13.3300 mg | INTRAMUSCULAR | Status: AC | PRN
  Administered 2018-01-25: 13.33 mg

## 2018-01-27 MED ORDER — BUPIVACAINE HCL 0.25 % IJ SOLN
0.3300 mL | INTRAMUSCULAR | Status: AC | PRN
  Administered 2018-01-25: .33 mL

## 2018-02-03 DIAGNOSIS — E7800 Pure hypercholesterolemia, unspecified: Secondary | ICD-10-CM | POA: Diagnosis not present

## 2018-02-03 DIAGNOSIS — I10 Essential (primary) hypertension: Secondary | ICD-10-CM | POA: Diagnosis not present

## 2018-02-25 ENCOUNTER — Ambulatory Visit
Admission: RE | Admit: 2018-02-25 | Discharge: 2018-02-25 | Disposition: A | Discharge status: Home / Self Care | Payer: BLUE CROSS/BLUE SHIELD | Source: Ambulatory Visit | Attending: Physician Assistant | Admitting: Physician Assistant

## 2018-02-25 DIAGNOSIS — M25511 Pain in right shoulder: Secondary | ICD-10-CM | POA: Diagnosis not present

## 2018-02-25 DIAGNOSIS — G8929 Other chronic pain: Secondary | ICD-10-CM

## 2018-02-27 NOTE — Progress Notes (Signed)
Fu appt to discuss mri

## 2018-02-28 ENCOUNTER — Ambulatory Visit (INDEPENDENT_AMBULATORY_CARE_PROVIDER_SITE_OTHER): Payer: BLUE CROSS/BLUE SHIELD | Admitting: Orthopaedic Surgery

## 2018-02-28 DIAGNOSIS — M7551 Bursitis of right shoulder: Secondary | ICD-10-CM | POA: Diagnosis not present

## 2018-02-28 MED ORDER — LIDOCAINE HCL 2 % IJ SOLN
2.0000 mL | INTRAMUSCULAR | Status: AC | PRN
  Administered 2018-02-28: 2 mL

## 2018-02-28 MED ORDER — BUPIVACAINE HCL 0.25 % IJ SOLN
2.0000 mL | INTRAMUSCULAR | Status: AC | PRN
  Administered 2018-02-28: 2 mL via INTRA_ARTICULAR

## 2018-02-28 MED ORDER — METHYLPREDNISOLONE ACETATE 40 MG/ML IJ SUSP
40.0000 mg | INTRAMUSCULAR | Status: AC | PRN
  Administered 2018-02-28: 40 mg via INTRA_ARTICULAR

## 2018-02-28 NOTE — Progress Notes (Signed)
Office Visit Note   Patient: Kristina Dunlap           Date of Birth: 04/03/1966           MRN: [id number] Visit Date: 02/28/2018              Requested by: Maurice Small, MD [place] [employer], [place] [address] PCP: Maurice Small, MD   Assessment & Plan: Visit Diagnoses:  1. Bursitis of right shoulder     Plan: Today, we will repeat the right shoulder subacromial cortisone injection.  We will also send the patient to formal physical therapy to work on a job exercise program.  We will also have them repeat dry needling to the left forearm as she has had great success with this in the past.  She will follow-up with [place] in 6 weeks time for recheck.  Call if concerns or questions in the meantime.  Follow-Up Instructions: Return in about 6 weeks (around 04/11/2018).   Orders:  Orders Placed This Encounter  Procedures  . Large Joint Inj: R subacromial bursa   No orders of the defined types were placed in this encounter.     Procedures: Large Joint Inj: R subacromial bursa on 02/28/2018 4:17 PM Indications: pain Details: 22 G needle Medications: 2 mL lidocaine 2 %; 2 mL bupivacaine 0.25 %; 40 mg methylPREDNISolone acetate 40 MG/ML Outcome: tolerated well, no immediate complications Patient was prepped and draped in the usual sterile fashion.       Clinical Data: No additional findings.   Subjective: Chief Complaint  Patient presents with  . Right Shoulder - Follow-up    MRI review    HPI patient is a pleasant 52 year old female who presents to our clinic today to discuss MRI results of her right shoulder.  MRI results of the right shoulder show subacromial bursitis.  Mild AC osteoarthritis.  She has had a subacromial cortisone injection in the past with moderate relief of symptoms however this did not last very long.  She also has a history of left tennis elbow and has recently started having pain to the lateral epicondyle.  She has had dry needling in the  past with great relief of symptoms.  Review of Systems as detailed in HPI.  All others reviewed and are negative.   Objective: Vital Signs: LMP 11/25/2009   Physical Exam well-developed well-nourished female in no acute distress.  Alert and oriented x3.  Ortho Exam stable right shoulder exam  Specialty Comments:  No specialty comments available.  Imaging: No new imaging   PMFS History: Patient Active Problem List   Diagnosis Date Noted  . Bursitis of right shoulder 02/28/2018  . Pain in right foot 01/25/2018  . Chronic right shoulder pain 01/25/2018  . Acute appendicitis 10/31/2015  . Hypertension   . Migraine headache with aura   . OCD (obsessive compulsive disorder)   . Hemorrhoids 08/30/2012   Past Medical History:  Diagnosis Date  . Anemia   . Fibroid   . Hemorrhoids   . Hyperlipidemia   . Hypertension   . Menorrhagia   . Migraine headache with aura 2012  . OCD (obsessive compulsive disorder)     Family History  Problem Relation Age of Onset  . Heart disease Father   . Hyperlipidemia Mother   . Dementia Mother   . Heart disease Maternal Grandmother   . Heart disease Maternal Grandfather     Past Surgical History:  Procedure Laterality Date  .  DILATATION & CURRETTAGE/HYSTEROSCOPY WITH RESECTOCOPE  12/17/08   of polyps  . DILATION AND CURETTAGE OF UTERUS    . HYSTEROSCOPY  12/17/08   resection polyps  . LAPAROSCOPIC APPENDECTOMY N/A 10/31/2015   Procedure: APPENDECTOMY LAPAROSCOPIC;  Surgeon: Erroll Luna, MD;  Location: [facility];  Service: General;  Laterality: N/A;  . ROBOTIC ASSISTED TOTAL HYSTERECTOMY  11/25/09   simple hyperplasia, fibroids  . TONSILLECTOMY     Social History   Occupational History  . 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 Smoking status: Never Smoker  . Smokeless tobacco: Never Used  Substance and Sexual Activity  . Alcohol use: Yes    Alcohol/week: 0.0 - 1.8 oz    Comment: beer  .  Drug use: No  . Sexual activity: Yes    Partners: Female    Birth control/protection: Surgical    Comment: R-TLH

## 2018-03-01 ENCOUNTER — Ambulatory Visit (INDEPENDENT_AMBULATORY_CARE_PROVIDER_SITE_OTHER): Payer: BLUE CROSS/BLUE SHIELD | Admitting: Orthopaedic Surgery

## 2018-03-02 ENCOUNTER — Other Ambulatory Visit: Payer: Self-pay

## 2018-03-02 ENCOUNTER — Encounter: Payer: Self-pay | Admitting: Obstetrics and Gynecology

## 2018-03-02 ENCOUNTER — Ambulatory Visit (INDEPENDENT_AMBULATORY_CARE_PROVIDER_SITE_OTHER): Payer: BLUE CROSS/BLUE SHIELD | Admitting: Obstetrics and Gynecology

## 2018-03-02 VITALS — BP 116/68 | HR 68 | Resp 16 | Ht 64.5 in | Wt 125.0 lb

## 2018-03-02 DIAGNOSIS — N6320 Unspecified lump in the left breast, unspecified quadrant: Secondary | ICD-10-CM

## 2018-03-02 DIAGNOSIS — Z01419 Encounter for gynecological examination (general) (routine) without abnormal findings: Secondary | ICD-10-CM

## 2018-03-02 NOTE — Patient Instructions (Addendum)
EXERCISE AND DIET:  We recommended that you start or continue a regular exercise program for good health. Regular exercise means any activity that makes your heart beat faster and makes you sweat.  We recommend exercising at least 30 minutes per day at least 3 days a week, preferably 4 or 5.  We also recommend a diet low in fat and sugar.  Inactivity, poor dietary choices and obesity can cause diabetes, heart attack, stroke, and kidney damage, among others.    ALCOHOL AND SMOKING:  Women should limit their alcohol intake to no more than 7 drinks/beers/glasses of wine (combined, not each!) per week. Moderation of alcohol intake to this level decreases your risk of breast cancer and liver damage. And of course, no recreational drugs are part of a healthy lifestyle.  And absolutely no smoking or even second hand smoke. Most people know smoking can cause heart and lung diseases, but did you know it also contributes to weakening of your bones? Aging of your skin?  Yellowing of your teeth and nails?  CALCIUM AND VITAMIN D:  Adequate intake of calcium and Vitamin D are recommended.  The recommendations for exact amounts of these supplements seem to change often, but generally speaking 600 mg of calcium (either carbonate or citrate) and 800 units of Vitamin D per day seems prudent. Certain women may benefit from higher intake of Vitamin D.  If you are among these women, your doctor will have told you during your visit.    PAP SMEARS:  Pap smears, to check for cervical cancer or precancers,  have traditionally been done yearly, although recent scientific advances have shown that most women can have pap smears less often.  However, every woman still should have a physical exam from her gynecologist every year. It will include a breast check, inspection of the vulva and vagina to check for abnormal growths or skin changes, a visual exam of the cervix, and then an exam to evaluate the size and shape of the uterus and  ovaries.  And after 53 years of age, a rectal exam is indicated to check for rectal cancers. We will also provide age appropriate advice regarding health maintenance, like when you should have certain vaccines, screening for sexually transmitted diseases, bone density testing, colonoscopy, mammograms, etc.   MAMMOGRAMS:  All women over 40 years old should have a yearly mammogram. Many facilities now offer a "3D" mammogram, which may cost around $50 extra out of pocket. If possible,  we recommend you accept the option to have the 3D mammogram performed.  It both reduces the number of women who will be called back for extra views which then turn out to be normal, and it is better than the routine mammogram at detecting truly abnormal areas.    COLONOSCOPY:  Colonoscopy to screen for colon cancer is recommended for all women at age 50.  We know, you hate the idea of the prep.  We agree, BUT, having colon cancer and not knowing it is worse!!  Colon cancer so often starts as a polyp that can be seen and removed at colonscopy, which can quite literally save your life!  And if your first colonoscopy is normal and you have no family history of colon cancer, most women don't have to have it again for 10 years.  Once every ten years, you can do something that may end up saving your life, right?  We will be happy to help you get it scheduled when you are ready.    Be sure to check your insurance coverage so you understand how much it will cost.  It may be covered as a preventative service at no cost, but you should check your particular policy.     Menopause and Herbal Products What is menopause? Menopause is the normal time of life when menstrual periods decrease in frequency and eventually stop completely. This process can take several years for some women. Menopause is complete when you have had an absence of menstruation for a full year since your last menstrual period. It usually occurs between the ages of 48 and  55. It is not common for menopause to begin before the age of 40. During menopause, your body stops producing the female hormones estrogen and progesterone. Common symptoms associated with this loss of hormones (vasomotor symptoms) are:  Hot flashes.  Hot flushes.  Night sweats.  Other common symptoms and complications of menopause include:  Decrease in sex drive.  Vaginal dryness and thinning of the walls of the vagina. This can make sex painful.  Dryness of the skin and development of wrinkles.  Headaches.  Tiredness.  Irritability.  Memory problems.  Weight gain.  Bladder infections.  Hair growth on the face and chest.  Inability to reproduce offspring (infertility).  Loss of density in the bones (osteoporosis) increasing your risk for breaks (fractures).  Depression.  Hardening and narrowing of the arteries (atherosclerosis). This increases your risk of heart attack and stroke.  What treatment options are available? There are many treatment choices for menopause symptoms. The most common treatment is hormone replacement therapy. Many alternative therapies for menopause are emerging, including the use of herbal products. These supplements can be found in the form of herbs, teas, oils, tinctures, and pills. Common herbal supplements for menopause are made from plants that contain phytoestrogens. Phytoestrogens are compounds that occur naturally in plants and plant products. They act like estrogen in the body. Foods and herbs that contain phytoestrogens include:  Soy.  Flax seeds.  Red clover.  Ginseng.  What menopause symptoms may be helped if I use herbal products?  Vasomotor symptoms. These may be helped by: ? Soy. Some studies show that soy may have a moderate benefit for hot flashes. ? Black cohosh. There is limited evidence indicating this may be beneficial for hot flashes.  Symptoms that are related to heart and blood vessel disease. These may be  helped by soy. Studies have shown that soy can help to lower cholesterol.  Depression. This may be helped by: ? St. John's wort. There is limited evidence that shows this may help mild to moderate depression. ? Black cohosh. There is evidence that this may help depression and mood swings.  Osteoporosis. Soy may help to decrease bone loss that is associated with menopause and may prevent osteoporosis. Limited evidence indicates that red clover may offer some bone loss protection as well. Other herbal products that are commonly used during menopause lack enough evidence to support their use as a replacement for conventional menopause therapies. These products include evening primrose, ginseng, and red clover. What are the cases when herbal products should not be used during menopause? Do not use herbal products during menopause without your health care provider's approval if:  You are taking medicine.  You have a preexisting liver condition.  Are there any risks in my taking herbal products during menopause? If you choose to use herbal products to help with symptoms of menopause, keep in mind that:  Different supplements have different and unmeasured   amounts of herbal ingredients.  Herbal products are not regulated the same way that medicines are.  Concentrations of herbs may vary depending on the way they are prepared. For example, the concentration may be different in a pill, tea, oil, and tincture.  Little is known about the risks of using herbal products, particularly the risks of long-term use.  Some herbal supplements can be harmful when combined with certain medicines.  Most commonly reported side effects of herbal products are mild. However, if used improperly, many herbal supplements can cause serious problems. Talk to your health care provider before starting any herbal product. If problems develop, stop taking the supplement and let your health care provider know. This  information is not intended to replace advice given to you by your health care provider. Make sure you discuss any questions you have with your health care provider. Document Released: 01/26/2008 Document Revised: 07/06/2016 Document Reviewed: 01/22/2014 Elsevier Interactive Patient Education  2017 [employer]  

## 2018-03-02 NOTE — Progress Notes (Signed)
Patient scheduled while in office at [facility] for Dx MMG left breast and [place], if needed, on 7/18 at 3pm. Last screening MMG at [facility] on 10/12/17, will fax copy of report to [facility]. Patient verbalizes understanding and is agreeable. Order faxed to [facility].

## 2018-03-02 NOTE — Progress Notes (Signed)
52 y.o. G41P0000 Married Caucasian female here for annual exam.    Hot flashes.  Now are improved.   Lost almost 20 pounds due to becoming vegan.  Taking MVI, D3.  New dx of an ulcer.  Taking a lot of goody powder due to HA.  She has stopped this and will see neurology this month.   Labs with PCP.   PCP: Dr. Justin Mend    Patient's last menstrual period was 11/25/2009.           Sexually active: Yes.    The current method of family planning is hysterectomy.    Exercising: No.  The patient does not participate in regular exercise at present. Smoker:  no  Health Maintenance: Pap:  11/25/2008 WNL neg HR HPV History of abnormal Pap:  Yes, years ago per patient - normal since MMG:  [facility] -- will call for results Colonoscopy:  05/02/15 - polyps - repeat 5-10 years BMD:   N/a  Result  n/a TDaP:  2014 Gardasil:   n/a HIV and Hep C: negative in the past Screening Labs:  PCP   reports that she has never smoked. She has never used smokeless tobacco. She reports that she drinks alcohol. She reports that she does not use drugs.  Past Medical History:  Diagnosis Date  . Anemia   . Fibroid   . Gastric ulcer   . Hemorrhoids   . Hyperlipidemia   . Hypertension   . Menorrhagia   . Migraine headache with aura 2012  . OCD (obsessive compulsive disorder)     Past Surgical History:  Procedure Laterality Date  . DILATATION & CURRETTAGE/HYSTEROSCOPY WITH RESECTOCOPE  12/17/08   of polyps  . DILATION AND CURETTAGE OF UTERUS    . HYSTEROSCOPY  12/17/08   resection polyps  . LAPAROSCOPIC APPENDECTOMY N/A 10/31/2015   Procedure: APPENDECTOMY LAPAROSCOPIC;  Surgeon: Erroll Luna, MD;  Location: [place];  Service: General;  Laterality: N/A;  . ROBOTIC ASSISTED TOTAL HYSTERECTOMY  11/25/09   simple hyperplasia, fibroids  . TONSILLECTOMY      Current Outpatient Medications  Medication Sig Dispense Refill  . Camphor-Menthol (TIGER BALM EXTRA STRENGTH EX) Apply 1 application topically as needed  (muscle pain).    . cetirizine (ZYRTEC) 10 MG tablet Take 10 mg by mouth daily.    . Cholecalciferol (VITAMIN D PO) Take 5,000 Int'l Units by mouth daily.    . citalopram (CELEXA) 40 MG tablet Take 40 mg by mouth daily.    . fluticasone (FLONASE) 50 MCG/ACT nasal spray Place 2 sprays into both nostrils daily.     Marland Kitchen ketotifen (ZADITOR) 0.025 % ophthalmic solution Place 1 drop into both eyes as needed.    Marland Kitchen lisinopril-hydrochlorothiazide (PRINZIDE,ZESTORETIC) 20-12.5 MG per tablet Take 1 tablet by mouth daily.    . Misc Natural Products (OSTEO BI-FLEX JOINT SHIELD PO) Take 1 tablet by mouth daily.     . Multiple Vitamins-Minerals (MULTIVITAMIN WITH MINERALS) tablet Take 1 tablet by mouth daily.    Marland Kitchen omeprazole (PRILOSEC) 40 MG capsule   3  . Propylene Glycol (SYSTANE BALANCE OP) Apply 1 drop to eye as needed (dry eyes).     No current facility-administered medications for this visit.     Family History  Problem Relation Age of Onset  . Heart disease Father   . Hyperlipidemia Mother   . Dementia Mother   . Heart disease Maternal Grandmother   . Heart disease Maternal Grandfather     Review of Systems  Constitutional: Negative.   HENT: Negative.   Eyes: Negative.   Respiratory: Negative.   Cardiovascular: Negative.   Gastrointestinal: Negative.   Endocrine: Negative.   Genitourinary: Negative.   Musculoskeletal: Negative.   Skin: Negative.   Allergic/Immunologic: Negative.   Neurological: Negative.   Hematological: Negative.   Psychiatric/Behavioral: Negative.     Exam:   BP 116/68 (BP Location: Right Arm, Patient Position: Sitting, Cuff Size: Normal)   Pulse 68   Resp 16   Ht 5' 4.5" (1.638 m)   Wt 125 lb (56.7 kg)   LMP 11/25/2009   BMI 21.12 kg/m     General appearance: alert, cooperative and appears stated age Head: Normocephalic, without obvious abnormality, atraumatic Neck: no adenopathy, supple, symmetrical, trachea midline and thyroid normal to inspection and  palpation Lungs: clear to auscultation bilaterally Breasts: normal appearance, no masses or tenderness on right and 1 cm mobile nontender mass of left breast at 7:00, No nipple retraction or dimpling, No nipple discharge or bleeding, No axillary or supraclavicular adenopathy Heart: regular rate and rhythm Abdomen: soft, non-tender; no masses, no organomegaly Extremities: extremities normal, atraumatic, no cyanosis or edema Skin: Skin color, texture, turgor normal. No rashes or lesions Lymph nodes: Cervical, supraclavicular, and axillary nodes normal. No abnormal inguinal nodes palpated Neurologic: Grossly normal  Pelvic: External genitalia:  no lesions              Urethra:  normal appearing urethra with no masses, tenderness or lesions              Bartholins and Skenes: normal                 Vagina: normal appearing vagina with normal color and discharge, no lesions              Cervix:  absent              Pap taken: No. Bimanual Exam:  Uterus:  absent              Adnexa: no mass, fullness, tenderness              Rectal exam: Yes.  .  Confirms.              Anus:  normal sphincter tone, no lesions  Chaperone was present for exam.  Assessment:   Well woman visit with normal exam. Status post hysterectomy. Ovaries remain.  Perimenopausal female.  Left breast mass.  Hx migraine with aura.  Daily use of Goody powder. Recent dx of ulcer.  Vegan.   Plan: Mammogram  Dx left and left breast [facility] at [facility].  Recommended self breast awareness. Pap and HR HPV as above. Guidelines for Calcium, Vitamin D, regular exercise program including cardiovascular and weight bearing exercise. Discussed menopausal changes.   List of herbal options to patient.  Follow up annually and prn.   After visit summary provided.

## 2018-03-04 DIAGNOSIS — N6320 Unspecified lump in the left breast, unspecified quadrant: Secondary | ICD-10-CM | POA: Insufficient documentation

## 2018-03-06 DIAGNOSIS — K250 Acute gastric ulcer with hemorrhage: Secondary | ICD-10-CM | POA: Diagnosis not present

## 2018-03-06 DIAGNOSIS — K219 Gastro-esophageal reflux disease without esophagitis: Secondary | ICD-10-CM | POA: Diagnosis not present

## 2018-03-07 DIAGNOSIS — M6281 Muscle weakness (generalized): Secondary | ICD-10-CM | POA: Diagnosis not present

## 2018-03-07 DIAGNOSIS — M25511 Pain in right shoulder: Secondary | ICD-10-CM | POA: Diagnosis not present

## 2018-03-07 DIAGNOSIS — M25522 Pain in left elbow: Secondary | ICD-10-CM | POA: Diagnosis not present

## 2018-03-09 DIAGNOSIS — N6324 Unspecified lump in the left breast, lower inner quadrant: Secondary | ICD-10-CM | POA: Diagnosis not present

## 2018-03-09 DIAGNOSIS — R928 Other abnormal and inconclusive findings on diagnostic imaging of breast: Secondary | ICD-10-CM | POA: Diagnosis not present

## 2018-03-10 DIAGNOSIS — J329 Chronic sinusitis, unspecified: Secondary | ICD-10-CM | POA: Diagnosis not present

## 2018-03-10 DIAGNOSIS — R05 Cough: Secondary | ICD-10-CM | POA: Diagnosis not present

## 2018-03-14 DIAGNOSIS — M25511 Pain in right shoulder: Secondary | ICD-10-CM | POA: Diagnosis not present

## 2018-03-14 DIAGNOSIS — M25522 Pain in left elbow: Secondary | ICD-10-CM | POA: Diagnosis not present

## 2018-03-14 DIAGNOSIS — M6281 Muscle weakness (generalized): Secondary | ICD-10-CM | POA: Diagnosis not present

## 2018-03-16 DIAGNOSIS — M25522 Pain in left elbow: Secondary | ICD-10-CM | POA: Diagnosis not present

## 2018-03-16 DIAGNOSIS — M6281 Muscle weakness (generalized): Secondary | ICD-10-CM | POA: Diagnosis not present

## 2018-03-16 DIAGNOSIS — M25511 Pain in right shoulder: Secondary | ICD-10-CM | POA: Diagnosis not present

## 2018-03-20 ENCOUNTER — Encounter: Payer: Self-pay | Admitting: Neurology

## 2018-03-20 ENCOUNTER — Ambulatory Visit (INDEPENDENT_AMBULATORY_CARE_PROVIDER_SITE_OTHER): Payer: BLUE CROSS/BLUE SHIELD | Admitting: Neurology

## 2018-03-20 VITALS — BP 100/70 | HR 76 | Ht 65.0 in | Wt 128.0 lb

## 2018-03-20 DIAGNOSIS — M542 Cervicalgia: Secondary | ICD-10-CM

## 2018-03-20 DIAGNOSIS — G43709 Chronic migraine without aura, not intractable, without status migrainosus: Secondary | ICD-10-CM

## 2018-03-20 MED ORDER — TOPIRAMATE ER 50 MG PO CAP24: 50.0000 mg | ORAL_CAPSULE | Freq: Every day | ORAL | 2 refills | Status: DC

## 2018-03-20 MED ORDER — SUMATRIPTAN SUCCINATE 100 MG PO TABS: ORAL_TABLET | ORAL | 3 refills | Status: DC

## 2018-03-20 NOTE — Patient Instructions (Signed)
Migraine Recommendations: 1.  Start topiramate ER 50mg  at bedtime.  Call in 4 weeks with update and we can adjust dose if needed. 2.  Take sumatriptan 100mg  at earliest onset of headache.  May repeat dose once in 2 hours if needed.  Do not exceed two tablets in 24 hours. 3.  Limit use of pain relievers to no more than 2 days out of the week.  These medications include acetaminophen, ibuprofen, triptans and narcotics.  This will help reduce risk of rebound headaches. 4.  Be aware of common food triggers such as processed sweets, processed foods with nitrites (such as deli meat, hot dogs, sausages), foods with MSG, alcohol (such as wine), chocolate, certain cheeses, certain fruits (dried fruits, bananas, some citrus fruit), vinegar, diet soda. 4.  Avoid caffeine 5.  Routine exercise 6.  Proper sleep hygiene 7.  Stay adequately hydrated with water 8.  Keep a headache diary. 9.  Maintain proper stress management. 10.  Do not skip meals. 11.  Consider supplements:  Magnesium citrate 400mg  to 600mg  daily, riboflavin 400mg , Coenzyme Q 10 100mg  three times daily

## 2018-03-20 NOTE — Progress Notes (Signed)
NEUROLOGY CONSULTATION NOTE  Kristina Dunlap MRN: [id number] DOB: [birth year]  Referring provider: Dr. Justin Mend Primary care provider: Dr. Justin Mend  Reason for consult:  headache  HISTORY OF PRESENT ILLNESS: Kristina Dunlap is a 52 year old right-handed female with hypercholesterolemia, hypertension, GERD who presents for headache.  Patient information supplemented by referring provider's note.  Onset:  Headaches since 52 years old but became daily in 2000. Location:  Bilateral periorbital/temples radiating into neck Quality:  throbbing Intensity:  Moderate to severe.  She denies new headache, thunderclap headache or severe headache that wakes her from sleep. Aura:  no Prodrome:  no Postdrome:  no Associated symptoms:  Photophobia, phonophobia.  She denies associated nausea, vomiting, visual disturbance, unilateral numbness or weakness. Duration:  All day Frequency:  Daily (severe headaches occur once a month) Frequency of abortive medication: Tylenol 3 days a week Triggers:  No Exacerbating factors:  Bending over Relieving factors:  no Activity:  Aggravated only when severe  Current NSAIDS:  No (contraindicated as she has a stomach ulcer) Current analgesics:  Tylenol Current triptans:  no Current ergotamine:  no Current anti-emetic:  no Current muscle relaxants:  no Current anti-anxiolytic:  no Current sleep aide:  no Current Antihypertensive medications:  lisinopril-HCTZ Current Antidepressant medications:  Citalopram 60mg  Current Anticonvulsant medications:  no Current anti-CGRP:  no Current Vitamins/Herbal/Supplements:  no Current Antihistamines/Decongestants:  Benadryl 25mg , Zyrtec, Flonase Other therapy:  no  Past NSAIDS:  Ibuprofen, naproxen Past analgesics:  Goody powder, Fioricet, Excedrin Past abortive triptans:  no Past abortive ergotamine:  no Past muscle relaxants:  tizanidine 4mg , balcofen 10mg  Past anti-emetic:  Zofran ODT 4mg , promethazine suppository 25mg  Past  antihypertensive medications:  HCTZ Past antidepressant medications:  bupropion Past anticonvulsant medications:  no Past anti-CGRP:  no Past vitamins/Herbal/Supplements:  no Past antihistamines/decongestants:  no Other past therapies:  no  Caffeine:  Decaf tea, no coffee, occasional soda Alcohol:  Occasional  Smoker:  no Diet:  water Exercise:  no Depression:  no; Anxiety:  no Other pain:  no Sleep hygiene:  good Family history of headache:  No  10/31/17: CMP with Na 136, K 3.6, glucose 176, BUN 27, Cr 0.72, t bili 0.4, ALP 66, AST 23, ALT 11.  PAST MEDICAL HISTORY: Past Medical History:  Diagnosis Date  . Anemia   . Fibroid   . Gastric ulcer   . Hemorrhoids   . Hyperlipidemia   . Hypertension   . Menorrhagia   . Migraine headache with aura 2012  . OCD (obsessive compulsive disorder)     PAST SURGICAL HISTORY: Past Surgical History:  Procedure Laterality Date  . DILATATION & CURRETTAGE/HYSTEROSCOPY WITH RESECTOCOPE  12/17/08   of polyps  . DILATION AND CURETTAGE OF UTERUS    . HYSTEROSCOPY  12/17/08   resection polyps  . LAPAROSCOPIC APPENDECTOMY N/A 10/31/2015   Procedure: APPENDECTOMY LAPAROSCOPIC;  Surgeon: Erroll Luna, MD;  Location: [facility];  Service: General;  Laterality: N/A;  . ROBOTIC ASSISTED TOTAL HYSTERECTOMY  11/25/09   simple hyperplasia, fibroids  . TONSILLECTOMY      MEDICATIONS: Current Outpatient Medications on File Prior to Visit  Medication Sig Dispense Refill  . vitamin B-12 (CYANOCOBALAMIN) 500 MCG tablet Take 500 mcg by mouth daily.    . Camphor-Menthol (TIGER BALM EXTRA STRENGTH EX) Apply 1 application topically as needed (muscle pain).    . cetirizine (ZYRTEC) 10 MG tablet Take 10 mg by mouth daily.    . Cholecalciferol (VITAMIN D PO) Take 5,000 Int'l Units by mouth  daily.    . citalopram (CELEXA) 40 MG tablet Take 40 mg by mouth daily.    . fluticasone (FLONASE) 50 MCG/ACT nasal spray Place 2 sprays into both nostrils daily.     Marland Kitchen  ketotifen (ZADITOR) 0.025 % ophthalmic solution Place 1 drop into both eyes as needed.    Marland Kitchen lisinopril-hydrochlorothiazide (PRINZIDE,ZESTORETIC) 20-12.5 MG per tablet Take 1 tablet by mouth daily.    . Misc Natural Products (OSTEO BI-FLEX JOINT SHIELD PO) Take 1 tablet by mouth daily.     . Multiple Vitamins-Minerals (MULTIVITAMIN WITH MINERALS) tablet Take 1 tablet by mouth daily.    Marland Kitchen omeprazole (PRILOSEC) 40 MG capsule   3  . Propylene Glycol (SYSTANE BALANCE OP) Apply 1 drop to eye as needed (dry eyes).     No current facility-administered medications on file prior to visit.     ALLERGIES: Allergies  Allergen Reactions  . Erythromycin Nausea Only    FAMILY HISTORY: Family History  Problem Relation Age of Onset  . Heart disease Father   . Hyperlipidemia Mother   . Dementia Mother   . Heart disease Maternal Grandmother   . Heart disease Maternal Grandfather     SOCIAL HISTORY: Social History   Socioeconomic History  . Marital status: Married    Spouse name: Skip Estimable  . Number of children: 0  . Years of education: Not on file  . Highest education level: 12th grade  Occupational History  . Occupation: Comptroller: [employer]  . Financial resource strain: Not on file  . Food insecurity:    Worry: Not on file    Inability: Not on file  . Transportation needs:    Medical: Not on file    Non-medical: Not on file  Tobacco Use  . Smoking status: Never Smoker  . Smokeless tobacco: Never Used  Substance and Sexual Activity  . Alcohol use: Yes    Alcohol/week: 0.0 - 1.8 oz    Comment: beer  . Drug use: No  . Sexual activity: Yes    Partners: Female    Birth control/protection: Surgical    Comment: R-TLH  Lifestyle  . Physical activity:    Days per week: Not on file    Minutes per session: Not on file  . Stress: Not on file  Relationships  . Social connections:    Talks on phone: Not on file    Gets together: Not on file     Attends religious service: Not on file    Active member of club or organization: Not on file    Attends meetings of clubs or organizations: Not on file    Relationship status: Not on file  . Intimate partner violence:    Fear of current or ex partner: Not on file    Emotionally abused: Not on file    Physically abused: Not on file    Forced sexual activity: Not on file  Other Topics Concern  . Not on file  Social History Narrative   Patient is right-handed. She is married to her same sex partner. She occasionally drinks tea or soda. No exercise.    REVIEW OF SYSTEMS: Constitutional: No fevers, chills, or sweats, no generalized fatigue, change in appetite Eyes: No visual changes, double vision, eye pain Ear, nose and throat: No hearing loss, ear pain, nasal congestion, sore throat Cardiovascular: No chest pain, palpitations Respiratory:  No shortness of breath at rest or with exertion, wheezes GastrointestinaI:  No nausea, vomiting, diarrhea, abdominal pain, fecal incontinence Genitourinary:  No dysuria, urinary retention or frequency Musculoskeletal:  Mild neck pain Integumentary: No rash, pruritus, skin lesions Neurological: as above Psychiatric: No depression, insomnia, anxiety Endocrine: No palpitations, fatigue, diaphoresis, mood swings, change in appetite, change in weight, increased thirst Hematologic/Lymphatic:  No purpura, petechiae. Allergic/Immunologic: no itchy/runny eyes, nasal congestion, recent allergic reactions, rashes  PHYSICAL EXAM: Vitals:   03/20/18 1520  BP: 100/70  Pulse: 76  SpO2: 97%   General: No acute distress.  Patient appears well-groomed.  Head:  Normocephalic/atraumatic Eyes:  fundi examined but not visualized Neck: supple, mild bilateral tenderness, full range of motion Back: No paraspinal tenderness Heart: regular rate and rhythm Lungs: Clear to auscultation bilaterally. Vascular: No carotid bruits. Neurological Exam: Mental status:  alert and oriented to person, place, and time, recent and remote memory intact, fund of knowledge intact, attention and concentration intact, speech fluent and not dysarthric, language intact. Cranial nerves: CN I: not tested CN II: pupils equal, round and reactive to light, visual fields intact CN III, IV, VI:  full range of motion, no nystagmus, no ptosis CN V: facial sensation intact CN VII: upper and lower face symmetric CN VIII: hearing intact CN IX, X: gag intact, uvula midline CN XI: sternocleidomastoid and trapezius muscles intact CN XII: tongue midline Bulk & Tone: normal, no fasciculations. Motor:  5/5 throughout  Sensation: temperature and vibration sensation intact. Deep Tendon Reflexes:  2+ throughout, toes downgoing.  Finger to nose testing:  Without dysmetria.  Heel to shin:  Without dysmetria.  Gait:  Normal station and stride.  Romberg negative.  IMPRESSION: Chronic migraine without aura, without status migrainosus, not intractable Cervicalgia  PLAN: 1.  Start topiramate ER 50mg  at bedtime.  Call in 4 weeks with update and we can adjust dose if needed. 2.  Take sumatriptan 100mg  at earliest onset of headache.  May repeat dose once in 2 hours if needed.  Do not exceed two tablets in 24 hours. 3.  Limit use of pain relievers to no more than 2 days out of the week.  These medications include acetaminophen, ibuprofen, triptans and narcotics.  This will help reduce risk of rebound headaches. 4.  Be aware of common food triggers such as processed sweets, processed foods with nitrites (such as deli meat, hot dogs, sausages), foods with MSG, alcohol (such as wine), chocolate, certain cheeses, certain fruits (dried fruits, bananas, some citrus fruit), vinegar, diet soda. 4.  Avoid caffeine 5.  Routine exercise 6.  Proper sleep hygiene 7.  Stay adequately hydrated with water 8.  Keep a headache diary. 9.  Maintain proper stress management. 10.  Do not skip meals. 11.   Consider supplements:  Magnesium citrate 400mg  to 600mg  daily, riboflavin 400mg , Coenzyme Q 10 100mg  three times daily 12.  Refer to Sports Medicine for neck pain 13.  Follow up in 4 months.  Thank you for allowing me to take part in the care of this patient.  Metta Clines, DO  CC:  Maurice Small, MD

## 2018-03-20 NOTE — Addendum Note (Signed)
Addended by: Clois Comber on: 03/20/2018 05:04 PM   Modules accepted: Orders

## 2018-03-23 DIAGNOSIS — M25522 Pain in left elbow: Secondary | ICD-10-CM | POA: Diagnosis not present

## 2018-03-23 DIAGNOSIS — M6281 Muscle weakness (generalized): Secondary | ICD-10-CM | POA: Diagnosis not present

## 2018-03-23 DIAGNOSIS — M25511 Pain in right shoulder: Secondary | ICD-10-CM | POA: Diagnosis not present

## 2018-03-24 ENCOUNTER — Telehealth: Payer: Self-pay | Admitting: Neurology

## 2018-03-24 NOTE — Telephone Encounter (Signed)
Pt has some questions and would like to speak with you.

## 2018-03-24 NOTE — Telephone Encounter (Signed)
Called and spoke with Pt. She wanted to know if could take topiramate a few hours before bedtime, I advised her that was ok.

## 2018-03-30 DIAGNOSIS — M25522 Pain in left elbow: Secondary | ICD-10-CM | POA: Diagnosis not present

## 2018-03-30 DIAGNOSIS — M6281 Muscle weakness (generalized): Secondary | ICD-10-CM | POA: Diagnosis not present

## 2018-03-30 DIAGNOSIS — M25511 Pain in right shoulder: Secondary | ICD-10-CM | POA: Diagnosis not present

## 2018-03-31 ENCOUNTER — Telehealth: Payer: Self-pay

## 2018-03-31 ENCOUNTER — Telehealth: Payer: Self-pay | Admitting: Neurology

## 2018-03-31 MED ORDER — NORTRIPTYLINE HCL 10 MG PO CAPS: 10.0000 mg | ORAL_CAPSULE | Freq: Every day | ORAL | 3 refills | Status: DC

## 2018-03-31 NOTE — Telephone Encounter (Signed)
Opened in error

## 2018-03-31 NOTE — Telephone Encounter (Signed)
I would stop Trokendi.  Instead, I would recommend starting nortriptyline 10mg  at bedtime.  It is an antidepressant that is often effective in migraine prevention.  We can increase dose in 4 weeks if needed.

## 2018-03-31 NOTE — Telephone Encounter (Signed)
Patient called regarding her Topiramate medication. She said she is unable to function on it. Please Advise. Thanks

## 2018-03-31 NOTE — Telephone Encounter (Signed)
Called and adv ised Pt of nortriptyline recommendation. She is agreeable to try.

## 2018-03-31 NOTE — Telephone Encounter (Signed)
Called and spoke with Pt. She is dizzy and lightheaded since starting the Trokendi. She said it almost makes her feel like she's on a narcotic. She tried taking it a few hours prior to bedtime, but she did not notice a difference. She has stopped taking it. Pt states you mentioned an anti-depressant as a preventative. She mentioned she is already on citalopram.

## 2018-04-11 ENCOUNTER — Ambulatory Visit (INDEPENDENT_AMBULATORY_CARE_PROVIDER_SITE_OTHER): Payer: BLUE CROSS/BLUE SHIELD | Admitting: Orthopaedic Surgery

## 2018-04-11 ENCOUNTER — Encounter (INDEPENDENT_AMBULATORY_CARE_PROVIDER_SITE_OTHER): Payer: Self-pay | Admitting: Orthopaedic Surgery

## 2018-04-11 DIAGNOSIS — G5761 Lesion of plantar nerve, right lower limb: Secondary | ICD-10-CM

## 2018-04-11 DIAGNOSIS — M7551 Bursitis of right shoulder: Secondary | ICD-10-CM | POA: Diagnosis not present

## 2018-04-11 MED ORDER — BUPIVACAINE HCL 0.5 % IJ SOLN
1.0000 mL | INTRAMUSCULAR | Status: AC | PRN
Start: 2018-04-11 — End: 2018-04-11
  Administered 2018-04-11: 1 mL

## 2018-04-11 MED ORDER — METHYLPREDNISOLONE ACETATE 40 MG/ML IJ SUSP
40.0000 mg | INTRAMUSCULAR | Status: AC | PRN
Start: 2018-04-11 — End: 2018-04-11
  Administered 2018-04-11: 40 mg

## 2018-04-11 MED ORDER — LIDOCAINE HCL 1 % IJ SOLN
1.0000 mL | INTRAMUSCULAR | Status: AC | PRN
  Administered 2018-04-11: 1 mL

## 2018-04-11 NOTE — Progress Notes (Signed)
Office Visit Note   Patient: Kristina Dunlap           Date of Birth: 1965-11-08           MRN: [id number] Visit Date: 04/11/2018              Requested by: Maurice Small, MD [place],  [id number] PCP: Maurice Small, MD   Assessment & Plan: Visit Diagnoses:  1. Bursitis of right shoulder   2. Morton's neuroma of right foot     Plan: Injection performed today for her Morton's neuroma in the first webspace.  Patient tolerates well.  For the shoulder I did offer repeat injection to see if this will supplement her physical therapy and the previous injection.  She declined and will continue with physical therapy.  I reviewed the MRI again which shows no structural abnormalities essentially some bursitis.  Questions encouraged and answered.  Follow-up as needed.  Follow-Up Instructions: Return if symptoms worsen or fail to improve.   Orders:  No orders of the defined types were placed in this encounter.  No orders of the defined types were placed in this encounter.     Procedures: Foot Inj Date/Time: 04/11/2018 5:03 PM Performed by: Leandrew Koyanagi, MD Authorized by: Leandrew Koyanagi, MD   Consent Given by:  Patient Indications:  Neuroma Condition: Morton's Neuroma   Location:  R foot Prep: patient was prepped and draped in usual sterile fashion   Needle Size:  25 G Approach:  Dorsal Medications:  1 mL lidocaine 1 %; 1 mL bupivacaine 0.5 %; 40 mg methylPREDNISolone acetate 40 MG/ML Patient Tolerance:  Patient tolerated the procedure well with no immediate complications     Clinical Data: No additional findings.   Subjective: Chief Complaint  Patient presents with  . Right Shoulder - Follow-up    Kristina Dunlap comes in today for follow-up pain.  Subacromial injection gave about 20 to 30% relief.  The pain is starting to come back some.  She is still doing home exercises and physical therapy.  Her right foot is also.  She had a Morton's neuroma  injection in June which did help significantly.  She is requesting another one today.   Review of Systems  Constitutional: Negative.   HENT: Negative.   Eyes: Negative.   Respiratory: Negative.   Cardiovascular: Negative.   Endocrine: Negative.   Musculoskeletal: Negative.   Neurological: Negative.   Hematological: Negative.   Psychiatric/Behavioral: Negative.   All other systems reviewed and are negative.    Objective: Vital Signs: LMP 11/25/2009   Physical Exam  Constitutional: She is oriented to person, place, and time. She appears well-developed and well-nourished.  Pulmonary/Chest: Effort normal.  Neurological: She is alert and oriented to person, place, and time.  Skin: Skin is warm. Capillary refill takes less than 2 seconds.  Psychiatric: She has a normal mood and affect. Her behavior is normal. Judgment and thought content normal.  Nursing note and vitals reviewed.   Ortho Exam Right shoulder exam shows positive impingement signs.  Rotator cuff intact. Right foot exam shows tenderness with palpation of the first webspace.  Negative Mulder's click. Specialty Comments:  No specialty comments available.  Imaging: No results found.   PMFS History: Patient Active Problem List   Diagnosis Date Noted  . Left breast mass 03/04/2018  . Bursitis of right shoulder 02/28/2018  . Pain in right foot 01/25/2018  . Chronic right shoulder pain 01/25/2018  . Acute  appendicitis 10/31/2015  . Hypertension   . Migraine headache with aura   . OCD (obsessive compulsive disorder)   . Hemorrhoids 08/30/2012   Past Medical History:  Diagnosis Date  . Anemia   . Fibroid   . Gastric ulcer   . Hemorrhoids   . Hyperlipidemia   . Hypertension   . Menorrhagia   . Migraine headache with aura 2012  . OCD (obsessive compulsive disorder)     Family History  Problem Relation Age of Onset  . Heart disease Father   . Hyperlipidemia Mother   . Dementia Mother   . Heart disease  Maternal Grandmother   . Heart disease Maternal Grandfather     Past Surgical History:  Procedure Laterality Date  . DILATATION & CURRETTAGE/HYSTEROSCOPY WITH RESECTOCOPE  12/17/08   of polyps  . DILATION AND CURETTAGE OF UTERUS    . HYSTEROSCOPY  12/17/08   resection polyps  . LAPAROSCOPIC APPENDECTOMY N/A 10/31/2015   Procedure: APPENDECTOMY LAPAROSCOPIC;  Surgeon: Erroll Luna, MD;  Location: [facility];  Service: General;  Laterality: N/A;  . ROBOTIC ASSISTED TOTAL HYSTERECTOMY  11/25/09   simple hyperplasia, fibroids  . TONSILLECTOMY     Social History   Occupational History  . 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 Smoking status: Never Smoker  . Smokeless tobacco: Never Used  Substance and Sexual Activity  . Alcohol use: Yes    Alcohol/week: 0.0 - 3.0 standard drinks    Comment: beer  . Drug use: No  . Sexual activity: Yes    Partners: Female    Birth control/protection: Surgical    Comment: R-TLH

## 2018-04-18 NOTE — Progress Notes (Signed)
[facility] [address], [place] [address] Phone: [phone] Subjective:     I Kristina Dunlap am serving as a Education administrator for Dr. Hulan Saas.  CC: neck pain   [id number]  Kristina Dunlap is a 52 y.o. female coming in with complaint of neck pain. States her neck is TTP. Also has headaches daily. No loss of ROM or strength.   Onset- Chronic Location- right mastoid process Duration-  Character- shooting pain to the head Aggravating factors- Reliving factors- Treats the headaches  Therapies tried-  Severity- 7/10     Past Medical History:  Diagnosis Date  . Anemia   . Fibroid   . Gastric ulcer   . Hemorrhoids   . Hyperlipidemia   . Hypertension   . Menorrhagia   . Migraine headache with aura 2012  . OCD (obsessive compulsive disorder)    Past Surgical History:  Procedure Laterality Date  . DILATATION & CURRETTAGE/HYSTEROSCOPY WITH RESECTOCOPE  12/17/08   of polyps  . DILATION AND CURETTAGE OF UTERUS    . HYSTEROSCOPY  12/17/08   resection polyps  . LAPAROSCOPIC APPENDECTOMY N/A 10/31/2015   Procedure: APPENDECTOMY LAPAROSCOPIC;  Surgeon: Erroll Luna, MD;  Location: [facility];  Service: General;  Laterality: N/A;  . ROBOTIC ASSISTED TOTAL HYSTERECTOMY  11/25/09   simple hyperplasia, fibroids  . TONSILLECTOMY     Social History   Socioeconomic History  . Marital status: Married    Spouse name: Skip Estimable  . Number of children: 0  . Years of education: Not on file  . Highest education level: 12th grade  Occupational History  . 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 Financial resource strain: Not on file  . Food insecurity:    Worry: Not on file    Inability: Not on file  . Transportation needs:    Medical: Not on file    Non-medical: Not on file  Tobacco Use  . Smoking status: Never Smoker  . Smokeless tobacco: Never Used  Substance and Sexual Activity  . Alcohol use: Yes   Alcohol/week: 0.0 - 3.0 standard drinks    Comment: beer  . Drug use: No  . Sexual activity: Yes    Partners: Female    Birth control/protection: Surgical    Comment: R-TLH  Lifestyle  . Physical activity:    Days per week: Not on file    Minutes per session: Not on file  . Stress: Not on file  Relationships  . Social connections:    Talks on phone: Not on file    Gets together: Not on file    Attends religious service: Not on file    Active member of club or organization: Not on file    Attends meetings of clubs or organizations: Not on file    Relationship status: Not on file  Other Topics Concern  . Not on file  Social History Narrative   Patient is right-handed. She is married to her same sex partner. She occasionally drinks tea or soda. No exercise.   Allergies  Allergen Reactions  . Erythromycin Nausea Only   Family History  Problem Relation Age of Onset  . Heart disease Father   . Hyperlipidemia Mother   . Dementia Mother   . Heart disease Maternal Grandmother   . Heart disease Maternal Grandfather      Current Outpatient Medications (Cardiovascular):  .  lisinopril-hydrochlorothiazide (PRINZIDE,ZESTORETIC) 20-12.5 MG per tablet, Take  1 tablet by mouth daily.  Current Outpatient Medications (Respiratory):  Marland Kitchen  Camphor-Menthol (TIGER BALM EXTRA STRENGTH EX), Apply 1 application topically as needed (muscle pain). .  cetirizine (ZYRTEC) 10 MG tablet, Take 10 mg by mouth daily. .  fluticasone (FLONASE) 50 MCG/ACT nasal spray, Place 2 sprays into both nostrils daily.   Current Outpatient Medications (Analgesics):  Marland Kitchen  SUMAtriptan (IMITREX) 100 MG tablet, Take 1 tablet earliest onset of migraine.  May repeat once in 2 hours if headache persists or recurs.  Current Outpatient Medications (Hematological):  .  vitamin B-12 (CYANOCOBALAMIN) 500 MCG tablet, Take 500 mcg by mouth daily.  Current Outpatient Medications (Other):  Marland Kitchen  Cholecalciferol (VITAMIN D PO), Take  5,000 Int'l Units by mouth daily. .  citalopram (CELEXA) 40 MG tablet, Take 40 mg by mouth daily. Marland Kitchen  ketotifen (ZADITOR) 0.025 % ophthalmic solution, Place 1 drop into both eyes as needed. .  Misc Natural Products (OSTEO BI-FLEX JOINT SHIELD PO), Take 1 tablet by mouth daily.  .  Multiple Vitamins-Minerals (MULTIVITAMIN WITH MINERALS) tablet, Take 1 tablet by mouth daily. .  nortriptyline (PAMELOR) 10 MG capsule, Take 1 capsule (10 mg total) by mouth at bedtime. Marland Kitchen  omeprazole (PRILOSEC) 40 MG capsule,  .  Propylene Glycol (SYSTANE BALANCE OP), Apply 1 drop to eye as needed (dry eyes). .  Topiramate ER (TROKENDI XR) 50 MG CP24, Take 50 mg by mouth at bedtime.    Past medical history, social, surgical and family history all reviewed in electronic medical record.  No pertanent information unless stated regarding to the chief complaint.   Review of Systems:  No headache, visual changes, nausea, vomiting, diarrhea, constipation, dizziness, abdominal pain, skin rash, fevers, chills, night sweats, weight loss, swollen lymph nodes, body aches, joint swelling, muscle aches, chest pain, shortness of breath, mood changes.   Objective  Blood pressure 98/70, pulse 76, height 5\' 5"  (1.651 m), weight 126 lb (57.2 kg), last menstrual period 11/25/2009, SpO2 93 %. Systems examined below as of    General: No apparent distress alert and oriented x3 mood and affect normal, dressed appropriately.  HEENT: Pupils equal, extraocular movements intact  Respiratory: Patient's speak in full sentences and does not appear short of breath  Cardiovascular: No lower extremity edema, non tender, no erythema  Skin: Warm dry intact with no signs of infection or rash on extremities or on axial skeleton.  Abdomen: Soft nontender  Neuro: Cranial nerves II through XII are intact, neurovascularly intact in all extremities with 2+ DTRs and 2+ pulses.  Lymph: No lymphadenopathy of posterior or anterior cervical chain or axillae  bilaterally.  Gait normal with good balance and coordination.  MSK:  Non tender with full range of motion and good stability and symmetric strength and tone of shoulders, elbows, wrist, hip, knee and ankles bilaterally.   Neck: Inspection loss of lordosis. No palpable stepoffs. Negative Spurling's maneuver. Mild limitation in extension and sidebending Grip strength and sensation normal in bilateral hands Strength good C4 to T1 distribution No sensory change to C4 to T1 Negative Hoffman sign bilaterally Reflexes normal Tightness of the trapezius bilaterally   Osteopathic findings  C2 flexed rotated and side bent right C4 flexed rotated and side bent left C7 flexed rotated and side bent left T3 extended rotated and side bent right inhaled third rib T6 extended rotated and side bent left    [address]7110; 15 additional minutes spent for Therapeutic exercises as stated in above notes.  This included exercises focusing on  stretching, strengthening, with significant focus on eccentric aspects.   Long term goals include an improvement in range of motion, strength, endurance as well as avoiding reinjury. Patient's frequency would include in 1-2 times a day, 3-5 times a week for a duration of 6-12 weeks. Exercises that included:  Basic scapular stabilization to include adduction and depression of scapula Scaption, focusing on proper movement and good control Internal and External rotation utilizing a theraband, with elbow tucked at side entire time Rows with theraband which was given   Proper technique shown and discussed handout in great detail with ATC.  All questions were discussed and answered.     Impression and Recommendations:     This case required medical decision making of moderate complexity. The above documentation has been reviewed and is accurate and complete Lyndal Pulley, DO       Note: This dictation was prepared with Dragon dictation along with smaller phrase technology.  Any transcriptional errors that result from this process are unintentional.

## 2018-04-19 ENCOUNTER — Encounter: Payer: Self-pay | Admitting: Family Medicine

## 2018-04-19 ENCOUNTER — Ambulatory Visit (INDEPENDENT_AMBULATORY_CARE_PROVIDER_SITE_OTHER): Payer: BLUE CROSS/BLUE SHIELD | Admitting: Family Medicine

## 2018-04-19 ENCOUNTER — Ambulatory Visit (INDEPENDENT_AMBULATORY_CARE_PROVIDER_SITE_OTHER)
Admission: RE | Admit: 2018-04-19 | Discharge: 2018-04-19 | Disposition: A | Discharge status: Home / Self Care | Payer: BLUE CROSS/BLUE SHIELD | Source: Ambulatory Visit | Attending: Family Medicine | Admitting: Family Medicine

## 2018-04-19 VITALS — BP 98/70 | HR 76 | Ht 65.0 in | Wt 126.0 lb

## 2018-04-19 DIAGNOSIS — G2589 Other specified extrapyramidal and movement disorders: Secondary | ICD-10-CM

## 2018-04-19 DIAGNOSIS — R51 Headache: Secondary | ICD-10-CM | POA: Diagnosis not present

## 2018-04-19 DIAGNOSIS — M542 Cervicalgia: Secondary | ICD-10-CM | POA: Diagnosis not present

## 2018-04-19 DIAGNOSIS — M999 Biomechanical lesion, unspecified: Secondary | ICD-10-CM

## 2018-04-19 NOTE — Assessment & Plan Note (Signed)
Decision today to treat with OMT was based on Physical Exam  After verbal consent patient was treated with HVLA, ME, FPR techniques in cervical, thoracic, rib areas  Patient tolerated the procedure well with improvement in symptoms  Patient given exercises, stretches and lifestyle modifications  See medications in patient instructions if given  Patient will follow up in 4 weeks 

## 2018-04-19 NOTE — Patient Instructions (Addendum)
Good to see you  xrays downstairs Ice 20 minutes 2 times daily. Usually after activity and before bed. Stay active. Keep hands within your peripheral vision  Exercises 3 times a week.  Over the counter find tart cherry extract any dose  See me again in 4  Weeks

## 2018-04-19 NOTE — Assessment & Plan Note (Signed)
Scapular dyskinesis.  Discussed icing regimen and home exercise. Discussed posture and ergonomics.  Discussed which activities to do which wants to avoid.  Patient's has been on multiple medications and I do not feel adding any at this time would be beneficial but we did discuss potential vitamins.  We discussed some ergonomics and proper lifting mechanics.  Follow-up again in 4 to 6 weeks

## 2018-04-25 DIAGNOSIS — K2900 Acute gastritis without bleeding: Secondary | ICD-10-CM | POA: Diagnosis not present

## 2018-04-25 DIAGNOSIS — K317 Polyp of stomach and duodenum: Secondary | ICD-10-CM | POA: Diagnosis not present

## 2018-04-25 DIAGNOSIS — K2930 Chronic superficial gastritis without bleeding: Secondary | ICD-10-CM | POA: Diagnosis not present

## 2018-04-25 DIAGNOSIS — K279 Peptic ulcer, site unspecified, unspecified as acute or chronic, without hemorrhage or perforation: Secondary | ICD-10-CM | POA: Diagnosis not present

## 2018-04-25 DIAGNOSIS — R12 Heartburn: Secondary | ICD-10-CM | POA: Diagnosis not present

## 2018-04-25 DIAGNOSIS — K219 Gastro-esophageal reflux disease without esophagitis: Secondary | ICD-10-CM | POA: Diagnosis not present

## 2018-05-02 DIAGNOSIS — K2930 Chronic superficial gastritis without bleeding: Secondary | ICD-10-CM | POA: Diagnosis not present

## 2018-05-12 ENCOUNTER — Telehealth: Payer: Self-pay | Admitting: Neurology

## 2018-05-12 NOTE — Telephone Encounter (Signed)
Attempted to reach Pt, VM is full unable to leave message

## 2018-05-12 NOTE — Telephone Encounter (Signed)
Patient lmom saying that she was doing well on the medication and to please call. Thanks

## 2018-05-15 MED ORDER — NORTRIPTYLINE HCL 25 MG PO CAPS: 25.0000 mg | ORAL_CAPSULE | Freq: Every day | ORAL | 3 refills | Status: DC

## 2018-05-15 NOTE — Telephone Encounter (Signed)
Patient made aware medication for increased dose sent to the pharmacy. She is agreeable to try and will call with any issues.

## 2018-05-15 NOTE — Telephone Encounter (Signed)
Yes.  If headaches not improved in 4 weeks, we can increase to 50mg  at bedtime

## 2018-05-15 NOTE — Telephone Encounter (Signed)
Called to advise Pt, no answer, no VM. Sent in Rx for 25 mg QHS

## 2018-05-15 NOTE — Telephone Encounter (Signed)
Pt called, she is still having headaches, it was discussed of increasing nortriptyline

## 2018-05-15 NOTE — Addendum Note (Signed)
Addended by: Clois Comber on: 05/15/2018 04:19 PM   Modules accepted: Orders

## 2018-05-22 NOTE — Progress Notes (Signed)
[facility] [address], [place] [address] Phone: [phone] Subjective:   Fontaine No, am serving as a scribe for Dr. Hulan Saas.  I'm seeing this patient by the request  of:    CC: Neck pain follow-up  [id number]  Kristina Dunlap is a 52 y.o. female coming in with complaint of neck pain. Has been trying to work on exercises at home. Has not made any ergonomic changes. Patient is feeling the same as last visit. Pain with squeezing the muscle in the back of her neck. Has been having headaches for the past 2 weeks. The intensity and duration have increased since last visit.  Patient was unable to tolerate the vitamins.  Had a rash.  Does not know which when it was not stopped all of them.     Past Medical History:  Diagnosis Date  . Anemia   . Fibroid   . Gastric ulcer   . Hemorrhoids   . Hyperlipidemia   . Hypertension   . Menorrhagia   . Migraine headache with aura 2012  . OCD (obsessive compulsive disorder)    Past Surgical History:  Procedure Laterality Date  . DILATATION & CURRETTAGE/HYSTEROSCOPY WITH RESECTOCOPE  12/17/08   of polyps  . DILATION AND CURETTAGE OF UTERUS    . HYSTEROSCOPY  12/17/08   resection polyps  . LAPAROSCOPIC APPENDECTOMY N/A 10/31/2015   Procedure: APPENDECTOMY LAPAROSCOPIC;  Surgeon: Erroll Luna, MD;  Location: [facility];  Service: General;  Laterality: N/A;  . ROBOTIC ASSISTED TOTAL HYSTERECTOMY  11/25/09   simple hyperplasia, fibroids  . TONSILLECTOMY     Social History   Socioeconomic History  . Marital status: Married    Spouse name: Skip Estimable  . Number of children: 0  . Years of education: Not on file  . Highest education level: 12th grade  Occupational History  . Occupation: Comptroller: [employer]  . Financial resource strain: Not on file  . Food insecurity:    Worry: Not on file    Inability: Not on file  . Transportation needs:    Medical:  Not on file    Non-medical: Not on file  Tobacco Use  . Smoking status: Never Smoker  . Smokeless tobacco: Never Used  Substance and Sexual Activity  . Alcohol use: Yes    Alcohol/week: 0.0 - 3.0 standard drinks    Comment: beer  . Drug use: No  . Sexual activity: Yes    Partners: Female    Birth control/protection: Surgical    Comment: R-TLH  Lifestyle  . Physical activity:    Days per week: Not on file    Minutes per session: Not on file  . Stress: Not on file  Relationships  . Social connections:    Talks on phone: Not on file    Gets together: Not on file    Attends religious service: Not on file    Active member of club or organization: Not on file    Attends meetings of clubs or organizations: Not on file    Relationship status: Not on file  Other Topics Concern  . Not on file  Social History Narrative   Patient is right-handed. She is married to her same sex partner. She occasionally drinks tea or soda. No exercise.   Allergies  Allergen Reactions  . Erythromycin Nausea Only   Family History  Problem Relation Age of Onset  . Heart  disease Father   . Hyperlipidemia Mother   . Dementia Mother   . Heart disease Maternal Grandmother   . Heart disease Maternal Grandfather      Current Outpatient Medications (Cardiovascular):  .  lisinopril-hydrochlorothiazide (PRINZIDE,ZESTORETIC) 20-12.5 MG per tablet, Take 1 tablet by mouth daily.  Current Outpatient Medications (Respiratory):  Marland Kitchen  Camphor-Menthol (TIGER BALM EXTRA STRENGTH EX), Apply 1 application topically as needed (muscle pain). .  cetirizine (ZYRTEC) 10 MG tablet, Take 10 mg by mouth daily. .  fluticasone (FLONASE) 50 MCG/ACT nasal spray, Place 2 sprays into both nostrils daily.   Current Outpatient Medications (Analgesics):  Marland Kitchen  SUMAtriptan (IMITREX) 100 MG tablet, Take 1 tablet earliest onset of migraine.  May repeat once in 2 hours if headache persists or recurs.  Current Outpatient Medications  (Hematological):  .  vitamin B-12 (CYANOCOBALAMIN) 500 MCG tablet, Take 500 mcg by mouth daily.  Current Outpatient Medications (Other):  Marland Kitchen  Cholecalciferol (VITAMIN D PO), Take 5,000 Int'l Units by mouth daily. .  citalopram (CELEXA) 40 MG tablet, Take 40 mg by mouth daily. Marland Kitchen  ketotifen (ZADITOR) 0.025 % ophthalmic solution, Place 1 drop into both eyes as needed. .  Misc Natural Products (OSTEO BI-FLEX JOINT SHIELD PO), Take 1 tablet by mouth daily.  .  Multiple Vitamins-Minerals (MULTIVITAMIN WITH MINERALS) tablet, Take 1 tablet by mouth daily. .  nortriptyline (PAMELOR) 10 MG capsule, Take 1 capsule (10 mg total) by mouth at bedtime. .  nortriptyline (PAMELOR) 25 MG capsule, Take 1 capsule (25 mg total) by mouth at bedtime. Marland Kitchen  omeprazole (PRILOSEC) 40 MG capsule,  .  Propylene Glycol (SYSTANE BALANCE OP), Apply 1 drop to eye as needed (dry eyes). .  Topiramate ER (TROKENDI XR) 50 MG CP24, Take 50 mg by mouth at bedtime.    Past medical history, social, surgical and family history all reviewed in electronic medical record.  No pertanent information unless stated regarding to the chief complaint.   Review of Systems:  No headache, visual changes, nausea, vomiting, diarrhea, constipation, dizziness, abdominal pain, skin rash, fevers, chills, night sweats, weight loss, swollen lymph nodes, body aches, joint swelling, muscle aches, chest pain, shortness of breath, mood changes.   Objective  Blood pressure 110/82, pulse 71, height 5\' 5"  (1.651 m), weight 127 lb (57.6 kg), last menstrual period 11/25/2009, SpO2 98 %.    General: No apparent distress alert and oriented x3 mood and affect normal, dressed appropriately.  HEENT: Pupils equal, extraocular movements intact  Respiratory: Patient's speak in full sentences and does not appear short of breath  Cardiovascular: No lower extremity edema, non tender, no erythema  Skin: Warm dry intact with no signs of infection or rash on extremities or  on axial skeleton.  Abdomen: Soft nontender  Neuro: Cranial nerves II through XII are intact, neurovascularly intact in all extremities with 2+ DTRs and 2+ pulses.  Lymph: No lymphadenopathy of posterior or anterior cervical chain or axillae bilaterally.  Gait normal with good balance and coordination.  MSK:  Non tender with full range of motion and good stability and symmetric strength and tone of shoulders, elbows, wrist, hip, knee and ankles bilaterally.  Neck: Inspection loss of lordosis. No palpable stepoffs. Negative Spurling's maneuver. Mild limited range of motion lacking last 5 degrees of extension and sidebending bilaterally Grip strength and sensation normal in bilateral hands Strength good C4 to T1 distribution No sensory change to C4 to T1 Negative Hoffman sign bilaterally Reflexes normal Tightness of the trapezius.  Osteopathic  findings  C2 flexed rotated and side bent right C6 flexed rotated and side bent left T3 extended rotated and side bent right inhaled third rib     Impression and Recommendations:     This case required medical decision making of moderate complexity. The above documentation has been reviewed and is accurate and complete Lyndal Pulley, DO       Note: This dictation was prepared with Dragon dictation along with smaller phrase technology. Any transcriptional errors that result from this process are unintentional.

## 2018-05-23 ENCOUNTER — Ambulatory Visit (INDEPENDENT_AMBULATORY_CARE_PROVIDER_SITE_OTHER): Payer: BLUE CROSS/BLUE SHIELD | Admitting: Family Medicine

## 2018-05-23 ENCOUNTER — Encounter: Payer: Self-pay | Admitting: Family Medicine

## 2018-05-23 VITALS — BP 110/82 | HR 71 | Ht 65.0 in | Wt 127.0 lb

## 2018-05-23 DIAGNOSIS — M999 Biomechanical lesion, unspecified: Secondary | ICD-10-CM

## 2018-05-23 DIAGNOSIS — G43109 Migraine with aura, not intractable, without status migrainosus: Secondary | ICD-10-CM | POA: Diagnosis not present

## 2018-05-23 NOTE — Assessment & Plan Note (Signed)
Still believe that some of the headaches is cervicogenic.  Attempted osteopathic manipulation with some good resolution of pain today.  Patient feels like this could be potentially beneficial.  We discussed other over-the-counter medications that could be beneficial.  Discussed topical anti-inflammatories, posture and ergonomics.  Follow-up again in 4 weeks

## 2018-05-23 NOTE — Progress Notes (Signed)
GYNECOLOGY  VISIT   HPI: 52 y.o.   Married female   [id number] with Patient's last menstrual period was 11/25/2009.   here for breast recheck.    Noted to have a left breast lump at routine annual exam on 03/02/18.   GYNECOLOGIC HISTORY: Patient's last menstrual period was 11/25/2009. Contraception:  hysterectomy Menopausal hormone therapy: none Last mammogram:  03/09/2018 diagnostic & left breast u/s Birads 2, benign Last pap smear:   11/2008 normal        OB History    Gravida  0   Para  0   Term  0   Preterm  0   AB  0   Living  0     SAB  0   TAB  0   Ectopic  0   Multiple  0   Live Births                 Patient Active Problem List   Diagnosis Date Noted  . Scapular dyskinesis 04/19/2018  . Nonallopathic lesion of cervical region 04/19/2018  . Nonallopathic lesion of rib cage 04/19/2018  . Nonallopathic lesion of thoracic region 04/19/2018  . Left breast mass 03/04/2018  . Bursitis of right shoulder 02/28/2018  . Pain in right foot 01/25/2018  . Chronic right shoulder pain 01/25/2018  . Acute appendicitis 10/31/2015  . Hypertension   . Migraine headache with aura   . OCD (obsessive compulsive disorder)   . Hemorrhoids 08/30/2012    Past Medical History:  Diagnosis Date  . Anemia   . Fibroid   . Gastric ulcer   . Hemorrhoids   . Hyperlipidemia   . Hypertension   . Menorrhagia   . Migraine headache with aura 2012  . OCD (obsessive compulsive disorder)     Past Surgical History:  Procedure Laterality Date  . DILATATION & CURRETTAGE/HYSTEROSCOPY WITH RESECTOCOPE  12/17/08   of polyps  . DILATION AND CURETTAGE OF UTERUS    . HYSTEROSCOPY  12/17/08   resection polyps  . LAPAROSCOPIC APPENDECTOMY N/A 10/31/2015   Procedure: APPENDECTOMY LAPAROSCOPIC;  Surgeon: Erroll Luna, MD;  Location: [facility];  Service: General;  Laterality: N/A;  . ROBOTIC ASSISTED TOTAL HYSTERECTOMY  11/25/09   simple hyperplasia, fibroids  . TONSILLECTOMY       Current Outpatient Medications  Medication Sig Dispense Refill  . CALCIUM PO Take by mouth.    . Camphor-Menthol (TIGER BALM EXTRA STRENGTH EX) Apply 1 application topically as needed (muscle pain).    . cetirizine (ZYRTEC) 10 MG tablet Take 10 mg by mouth daily.    . Cholecalciferol (VITAMIN D PO) Take 5,000 Int'l Units by mouth daily.    . citalopram (CELEXA) 40 MG tablet Take 40 mg by mouth daily.    . fluticasone (FLONASE) 50 MCG/ACT nasal spray Place 2 sprays into both nostrils daily.     Marland Kitchen ketotifen (ZADITOR) 0.025 % ophthalmic solution Place 1 drop into both eyes as needed.    Marland Kitchen lisinopril-hydrochlorothiazide (PRINZIDE,ZESTORETIC) 20-12.5 MG per tablet Take 1 tablet by mouth daily.    . Misc Natural Products (OSTEO BI-FLEX JOINT SHIELD PO) Take 1 tablet by mouth daily.     . Multiple Vitamins-Minerals (MULTIVITAMIN WITH MINERALS) tablet Take 1 tablet by mouth daily.    Marland Kitchen omeprazole (PRILOSEC) 40 MG capsule   3  . Propylene Glycol (SYSTANE BALANCE OP) Apply 1 drop to eye as needed (dry eyes).    . SUMAtriptan (IMITREX) 100 MG tablet Take 1 tablet  earliest onset of migraine.  May repeat once in 2 hours if headache persists or recurs. 10 tablet 3  . Topiramate ER (TROKENDI XR) 50 MG CP24 Take 50 mg by mouth at bedtime. 30 capsule 2  . vitamin B-12 (CYANOCOBALAMIN) 500 MCG tablet Take 500 mcg by mouth daily.    . nortriptyline (PAMELOR) 25 MG capsule Take 1 capsule (25 mg total) by mouth at bedtime. (Patient not taking: Reported on 05/24/2018) 30 capsule 3   No current facility-administered medications for this visit.      ALLERGIES: Erythromycin  Family History  Problem Relation Age of Onset  . Heart disease Father   . Hyperlipidemia Mother   . Dementia Mother   . Heart disease Maternal Grandmother   . Heart disease Maternal Grandfather     Social History   Socioeconomic History  . Marital status: Married    Spouse name: Skip Estimable  . Number of children: 0  . Years of  education: Not on file  . Highest education level: 12th grade  Occupational History  . 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 Financial resource strain: Not on file  . Food insecurity:    Worry: Not on file    Inability: Not on file  . Transportation needs:    Medical: Not on file    Non-medical: Not on file  Tobacco Use  . Smoking status: Never Smoker  . Smokeless tobacco: Never Used  Substance and Sexual Activity  . Alcohol use: Not Currently  . Drug use: No  . Sexual activity: Yes    Partners: Female    Birth control/protection: Surgical    Comment: R-TLH  Lifestyle  . Physical activity:    Days per week: Not on file    Minutes per session: Not on file  . Stress: Not on file  Relationships  . Social connections:    Talks on phone: Not on file    Gets together: Not on file    Attends religious service: Not on file    Active member of club or organization: Not on file    Attends meetings of clubs or organizations: Not on file    Relationship status: Not on file  . Intimate partner violence:    Fear of current or ex partner: Not on file    Emotionally abused: Not on file    Physically abused: Not on file    Forced sexual activity: Not on file  Other Topics Concern  . Not on file  Social History Narrative   Patient is right-handed. She is married to her same sex partner. She occasionally drinks tea or soda. No exercise.    Review of Systems  Constitutional: Negative.   HENT: Negative.   Eyes: Negative.   Respiratory: Negative.   Cardiovascular: Negative.   Gastrointestinal: Negative.   Endocrine: Negative.   Genitourinary: Negative.   Musculoskeletal: Negative.   Skin: Negative.   Allergic/Immunologic: Negative.   Neurological: Negative.   Hematological: Negative.   Psychiatric/Behavioral: Negative.     PHYSICAL EXAMINATION:    BP 112/70   Pulse 64   Resp 16   Wt 128 lb (58.1 kg)   LMP 11/25/2009   BMI  21.30 kg/m     General appearance: alert, cooperative and appears stated age   Breasts:  Right - no masses or tenderness, No nipple retraction or dimpling, No nipple discharge or bleeding, No axillary adenopathy. Left with 1 cm mass at  7:00, nontender.  No nipple retraction or dimpling, No nipple discharge or bleeding, No axillary adenopathy.  Chaperone was present for exam.  ASSESSMENT  Left breast mass.  Negative dx mammogram and ultrasound.     PLAN  We discussed her findings and negative imaging.  Will refer to general surgeon for further evaluation and treatment.  Prefers a female.  FU prn.    An After Visit Summary was printed and given to the patient.  __15____ minutes face to face time of which over 50% was spent in counseling.

## 2018-05-23 NOTE — Patient Instructions (Signed)
Good to see you  Ice is your friend Ice 20 minutes 2 times daily. Usually after activity and before bed. On wall with heels, butt shoulder and head touching for a goal of 5 minutes daily  Stay active Calcium pyruvate 1500mg  daily  See me again in 3-4 weeks

## 2018-05-23 NOTE — Assessment & Plan Note (Signed)
Decision today to treat with OMT was based on Physical Exam  After verbal consent patient was treated with HVLA, ME, FPR techniques in cervical, thoracic, rib areas  Patient tolerated the procedure well with improvement in symptoms  Patient given exercises, stretches and lifestyle modifications  See medications in patient instructions if given  Patient will follow up in 4 weeks 

## 2018-05-24 ENCOUNTER — Other Ambulatory Visit: Payer: Self-pay

## 2018-05-24 ENCOUNTER — Ambulatory Visit (INDEPENDENT_AMBULATORY_CARE_PROVIDER_SITE_OTHER): Payer: BLUE CROSS/BLUE SHIELD | Admitting: Obstetrics and Gynecology

## 2018-05-24 ENCOUNTER — Encounter: Payer: Self-pay | Admitting: Obstetrics and Gynecology

## 2018-05-24 VITALS — BP 112/70 | HR 64 | Resp 16 | Wt 128.0 lb

## 2018-05-24 DIAGNOSIS — N6320 Unspecified lump in the left breast, unspecified quadrant: Secondary | ICD-10-CM | POA: Diagnosis not present

## 2018-05-25 ENCOUNTER — Telehealth: Payer: Self-pay | Admitting: Emergency Medicine

## 2018-05-25 NOTE — Telephone Encounter (Signed)
Chief Complaint  Patient presents with  . Appointment    Referral to Breast Surgeon    Left message with referral coordinator at [facility]  To return call with appointment information and referral faxed this morning for patient for L Breast Mass. Prefers female provider. Prefers late afternoon appointment.

## 2018-05-26 ENCOUNTER — Encounter: Payer: Self-pay | Admitting: Emergency Medicine

## 2018-05-26 DIAGNOSIS — Z0000 Encounter for general adult medical examination without abnormal findings: Secondary | ICD-10-CM | POA: Diagnosis not present

## 2018-05-26 DIAGNOSIS — I10 Essential (primary) hypertension: Secondary | ICD-10-CM | POA: Diagnosis not present

## 2018-05-26 DIAGNOSIS — Z789 Other specified health status: Secondary | ICD-10-CM | POA: Diagnosis not present

## 2018-05-26 DIAGNOSIS — D509 Iron deficiency anemia, unspecified: Secondary | ICD-10-CM | POA: Diagnosis not present

## 2018-05-26 DIAGNOSIS — E7800 Pure hypercholesterolemia, unspecified: Secondary | ICD-10-CM | POA: Diagnosis not present

## 2018-05-26 DIAGNOSIS — Z23 Encounter for immunization: Secondary | ICD-10-CM | POA: Diagnosis not present

## 2018-05-26 NOTE — Telephone Encounter (Signed)
Patient is scheduled with Dr. Barry Dienes on 06/12/18 at 10:20 at [facility].   No afternoon appointments available as Dr. Barry Dienes does not have availability in the afternoons (Pt had preference for afternoons). Can reschedule with another female provider in the office if patient chooses to, but will need to call to reschedule.   Call to patient and phone immediately goes to voicemail.  Voicemailbox full. Unable to leave message.  Routing message to [facility] for follow up.  Mychart message sent to patient with appointment information as well.

## 2018-05-28 NOTE — Telephone Encounter (Signed)
Please keep in mammogram hold until the appointment is completed with general surgeon.

## 2018-05-29 NOTE — Telephone Encounter (Signed)
Continued hold until appointment with Dr. Barry Dienes.  Noted message from Dr. Quincy Simmonds.

## 2018-06-12 ENCOUNTER — Other Ambulatory Visit: Payer: Self-pay | Admitting: General Surgery

## 2018-06-12 DIAGNOSIS — N6320 Unspecified lump in the left breast, unspecified quadrant: Secondary | ICD-10-CM | POA: Diagnosis not present

## 2018-06-14 NOTE — Progress Notes (Signed)
[facility] [address] [place], [place] [address] Phone: [phone] Subjective:    I Kristina Dunlap am serving as a Education administrator for Dr. Hulan Saas.   CC: Neck and back pain  QHU:[id number]  Kristina Dunlap is a 52 y.o. female coming in with complaint of neck/back pain. States that everything is doing pretty good.  Patient has been doing relatively better.  80% better.  Not as severe. Headaches like she has had previously.  Patient feels like everything worked on the posture more she would be even better.    Past Medical History:  Diagnosis Date  . Anemia   . Fibroid   . Gastric ulcer   . Hemorrhoids   . Hyperlipidemia   . Hypertension   . Menorrhagia   . Migraine headache with aura 2012  . OCD (obsessive compulsive disorder)    Past Surgical History:  Procedure Laterality Date  . DILATATION & CURRETTAGE/HYSTEROSCOPY WITH RESECTOCOPE  12/17/08   of polyps  . DILATION AND CURETTAGE OF UTERUS    . HYSTEROSCOPY  12/17/08   resection polyps  . LAPAROSCOPIC APPENDECTOMY N/A 10/31/2015   Procedure: APPENDECTOMY LAPAROSCOPIC;  Surgeon: Erroll Luna, MD;  Location: [facility];  Service: General;  Laterality: N/A;  . ROBOTIC ASSISTED TOTAL HYSTERECTOMY  11/25/09   simple hyperplasia, fibroids  . TONSILLECTOMY     Social History   Socioeconomic History  . Marital status: Married    Spouse name: Skip Estimable  . Number of children: 0  . Years of education: Not on file  . Highest education level: 12th grade  Occupational History  . Occupation: Comptroller: [employer]  . Financial resource strain: Not on file  . Food insecurity:    Worry: Not on file    Inability: Not on file  . Transportation needs:    Medical: Not on file    Non-medical: Not on file  Tobacco Use  . Smoking status: Never Smoker  . Smokeless tobacco: Never Used  Substance and Sexual Activity  . Alcohol use: Not Currently  . Drug use: No  .  Sexual activity: Yes    Partners: Female    Birth control/protection: Surgical    Comment: R-TLH  Lifestyle  . Physical activity:    Days per week: Not on file    Minutes per session: Not on file  . Stress: Not on file  Relationships  . Social connections:    Talks on phone: Not on file    Gets together: Not on file    Attends religious service: Not on file    Active member of club or organization: Not on file    Attends meetings of clubs or organizations: Not on file    Relationship status: Not on file  Other Topics Concern  . Not on file  Social History Narrative   Patient is right-handed. She is married to her same sex partner. She occasionally drinks tea or soda. No exercise.   Allergies  Allergen Reactions  . Erythromycin Nausea Only   Family History  Problem Relation Age of Onset  . Heart disease Father   . Hyperlipidemia Mother   . Dementia Mother   . Heart disease Maternal Grandmother   . Heart disease Maternal Grandfather      Current Outpatient Medications (Cardiovascular):  .  lisinopril-hydrochlorothiazide (PRINZIDE,ZESTORETIC) 20-12.5 MG per tablet, Take 1 tablet by mouth daily.  Current Outpatient Medications (Respiratory):  .  Camphor-Menthol (TIGER BALM EXTRA STRENGTH EX), Apply 1 application topically as needed (muscle pain). .  cetirizine (ZYRTEC) 10 MG tablet, Take 10 mg by mouth daily. .  fluticasone (FLONASE) 50 MCG/ACT nasal spray, Place 2 sprays into both nostrils daily.   Current Outpatient Medications (Analgesics):  Marland Kitchen  SUMAtriptan (IMITREX) 100 MG tablet, Take 1 tablet earliest onset of migraine.  May repeat once in 2 hours if headache persists or recurs.  Current Outpatient Medications (Hematological):  .  vitamin B-12 (CYANOCOBALAMIN) 500 MCG tablet, Take 500 mcg by mouth daily.  Current Outpatient Medications (Other):  Marland Kitchen  CALCIUM PO, Take by mouth. .  Cholecalciferol (VITAMIN D PO), Take 5,000 Int'l Units by mouth daily. .  citalopram  (CELEXA) 40 MG tablet, Take 40 mg by mouth daily. Marland Kitchen  ketotifen (ZADITOR) 0.025 % ophthalmic solution, Place 1 drop into both eyes as needed. .  Misc Natural Products (OSTEO BI-FLEX JOINT SHIELD PO), Take 1 tablet by mouth daily.  .  Multiple Vitamins-Minerals (MULTIVITAMIN WITH MINERALS) tablet, Take 1 tablet by mouth daily. .  nortriptyline (PAMELOR) 25 MG capsule, Take 1 capsule (25 mg total) by mouth at bedtime. Marland Kitchen  omeprazole (PRILOSEC) 40 MG capsule,  .  Propylene Glycol (SYSTANE BALANCE OP), Apply 1 drop to eye as needed (dry eyes). .  Topiramate ER (TROKENDI XR) 50 MG CP24, Take 50 mg by mouth at bedtime.    Past medical history, social, surgical and family history all reviewed in electronic medical record.  No pertanent information unless stated regarding to the chief complaint.   Review of Systems:  No headache, visual changes, nausea, vomiting, diarrhea, constipation, dizziness, abdominal pain, skin rash, fevers, chills, night sweats, weight loss, swollen lymph nodes, body aches, joint swelling,  chest pain, shortness of breath, mood changes.  Positive muscle aches  Objective  Blood pressure 136/82, pulse 66, height 5\' 5"  (1.651 m), weight 128 lb (58.1 kg), last menstrual period 11/25/2009, SpO2 98 %.    General: No apparent distress alert and oriented x3 mood and affect normal, dressed appropriately.  HEENT: Pupils equal, extraocular movements intact  Respiratory: Patient's speak in full sentences and does not appear short of breath  Cardiovascular: No lower extremity edema, non tender, no erythema  Skin: Warm dry intact with no signs of infection or rash on extremities or on axial skeleton.  Abdomen: Soft nontender  Neuro: Cranial nerves II through XII are intact, neurovascularly intact in all extremities with 2+ DTRs and 2+ pulses.  Lymph: No lymphadenopathy of posterior or anterior cervical chain or axillae bilaterally.  Gait normal with good balance and coordination.  MSK:   Non tender with full range of motion and good stability and symmetric strength and tone of shoulders, elbows, wrist, hip, knee and ankles bilaterally.   Neck exam does show loss of lordosis.  Still limited in sidebending bilaterally but improvement in range of motion with rotation.  Negative Spurling's.  Full strength of the upper extremities.  Tenderness in the trapezius is bilaterally as well as around the parascapular region right greater than left  Osteopathic findings C2 flexed rotated and side bent right C4 flexed rotated and side bent left C6 flexed rotated and side bent left T3 extended rotated and side bent right inhaled third rib     Impression and Recommendations:     This case required medical decision making of moderate complexity. The above documentation has been reviewed and is accurate and complete Lyndal Pulley, DO  Note: This dictation was prepared with Dragon dictation along with smaller phrase technology. Any transcriptional errors that result from this process are unintentional.

## 2018-06-15 ENCOUNTER — Ambulatory Visit (INDEPENDENT_AMBULATORY_CARE_PROVIDER_SITE_OTHER): Payer: BLUE CROSS/BLUE SHIELD | Admitting: Family Medicine

## 2018-06-15 ENCOUNTER — Encounter: Payer: Self-pay | Admitting: Family Medicine

## 2018-06-15 VITALS — BP 136/82 | HR 66 | Ht 65.0 in | Wt 128.0 lb

## 2018-06-15 DIAGNOSIS — G2589 Other specified extrapyramidal and movement disorders: Secondary | ICD-10-CM

## 2018-06-15 DIAGNOSIS — M999 Biomechanical lesion, unspecified: Secondary | ICD-10-CM

## 2018-06-15 NOTE — Assessment & Plan Note (Signed)
Scapular dyskinesis.  Made some progress at this point.  I we discussed posture and ergonomics.  It is helping the chronic headaches.  Follow-up again in 4 to 8 weeks

## 2018-06-15 NOTE — Assessment & Plan Note (Signed)
Decision today to treat with OMT was based on Physical Exam  After verbal consent patient was treated with HVLA, ME, FPR techniques in cervical, thoracic, rib areas  Patient tolerated the procedure well with improvement in symptoms  Patient given exercises, stretches and lifestyle modifications  See medications in patient instructions if given  Patient will follow up in 4-8 weeks 

## 2018-06-15 NOTE — Patient Instructions (Signed)
Good to see you  Ice is your friend Keep it up  See me again in 7-8 weeks

## 2018-06-29 ENCOUNTER — Encounter (HOSPITAL_BASED_OUTPATIENT_CLINIC_OR_DEPARTMENT_OTHER): Payer: Self-pay | Admitting: *Deleted

## 2018-06-29 ENCOUNTER — Other Ambulatory Visit: Payer: Self-pay

## 2018-07-03 ENCOUNTER — Encounter (HOSPITAL_BASED_OUTPATIENT_CLINIC_OR_DEPARTMENT_OTHER)
Admission: RE | Admit: 2018-07-03 | Discharge: 2018-07-03 | Disposition: A | Discharge status: Home / Self Care | Payer: BLUE CROSS/BLUE SHIELD | Source: Ambulatory Visit | Attending: General Surgery | Admitting: General Surgery

## 2018-07-03 DIAGNOSIS — Z01812 Encounter for preprocedural laboratory examination: Secondary | ICD-10-CM | POA: Diagnosis not present

## 2018-07-03 LAB — BASIC METABOLIC PANEL
ANION GAP: 6 (ref 5–15)
BUN: 8 mg/dL (ref 6–20)
CALCIUM: 9.4 mg/dL (ref 8.9–10.3)
CO2: 32 mmol/L (ref 22–32)
CREATININE: 0.7 mg/dL (ref 0.44–1.00)
Chloride: 103 mmol/L (ref 98–111)
GFR calc Af Amer: >60 mL/min (ref >60)
GFR calc non Af Amer: >60 mL/min (ref >60)
GLUCOSE: 89 mg/dL (ref 70–99)
Potassium: 4.2 mmol/L (ref 3.5–5.1)
Sodium: 141 mmol/L (ref 135–145)

## 2018-07-03 NOTE — Progress Notes (Signed)
Pt came in this morning for her PAT. A BMP was drawn and placed in refrigerator for pick up. I also gave the pt her pre surgical Ensure and Hibiclens along with instructions on both.

## 2018-07-04 NOTE — H&P (Signed)
Kristina Dunlap Documented: 06/12/2018 11:21 AM Location: [facility] Patient #: [id number] DOB: 1966/04/11 Divorced / Language: Kristina Dunlap / Race: Refused to Report/Unreported Female   History of Present Illness [name] MD; 06/12/2018 5:17 PM) The patient is a 52 year old female who presents with a breast mass. Pt is a 52 yo F referred for consultation by Kristina Dunlap for a left breast mass. The patient had a negative mammogram, but her provider felt a mass on the left. Diagnostic mammogram and ultrasound at [facility] 03/09/18 were negative for concerning findings. Upon repeat exam, the area of abnormality was still present, so she is referred to discuss biopsy. The patient denies personal or family history of cancer. She has never had callback before. She has occasional breast soreness.    Past Surgical History Kristina Dunlap, [place]; 06/12/2018 11:21 AM) Colon Polyp Removal - Colonoscopy  Hysterectomy (not due to cancer) - Partial  Resection of Stomach   Diagnostic Studies History Kristina Dunlap, [place]; 06/12/2018 11:21 AM) Mammogram  within last year Pap Smear  >5 years ago  Medication History Kristina Dunlap, CMA; 06/12/2018 11:22 AM) ZyrTEC Allergy (10MG  Capsule, Oral as needed) Active. Vitamin D (1000UNIT Tablet, Oral daily) Active. CeleXA (40MG  Tablet, Oral daily) Active. Flonase (50MCG/ACT Suspension, Nasal as needed) Active. Lisinopril-Hydrochlorothiazide (20-12.5MG  Tablet, Oral daily) Active. Osteo Bi-Flex Adv Joint Shield (Oral daily) Active. Multivitamin Adult (Oral daily) Active. Medications Reconciled  Pregnancy / Birth History Kristina Dunlap, [place]; 06/12/2018 11:21 AM) Age at menarche  25 years. Gravida  0 Para  0  Other Problems (Kristina Dunlap, CMA; 06/12/2018 11:21 AM) Anxiety Disorder  Back Pain  Hemorrhoids  High blood pressure  Hypercholesterolemia  Migraine Headache     Review of Systems (Kristina Dunlap CMA;  06/12/2018 11:21 AM) General Not Present- Appetite Loss, Chills, Fatigue, Fever, Night Sweats, Weight Gain and Weight Loss. Skin Not Present- Change in Wart/Mole, Dryness, Hives, Jaundice, New Lesions, Non-Healing Wounds, Rash and Ulcer. HEENT Present- Seasonal Allergies. Not Present- Earache, Hearing Loss, Hoarseness, Nose Bleed, Oral Ulcers, Ringing in the Ears, Sinus Pain, Sore Throat, Visual Disturbances, Wears glasses/contact lenses and Yellow Eyes. Respiratory Present- Snoring. Not Present- Bloody sputum, Chronic Cough, Difficulty Breathing and Wheezing. Breast Not Present- Breast Mass, Breast Pain, Nipple Discharge and Skin Changes. Cardiovascular Not Present- Chest Pain, Difficulty Breathing Lying Down, Leg Cramps, Palpitations, Rapid Heart Rate, Shortness of Breath and Swelling of Extremities. Gastrointestinal Present- Excessive gas, Hemorrhoids and Indigestion. Not Present- Abdominal Pain, Bloating, Bloody Stool, Change in Bowel Habits, Chronic diarrhea, Constipation, Difficulty Swallowing, Gets full quickly at meals, Nausea, Rectal Pain and Vomiting. Female Genitourinary Not Present- Frequency, Nocturia, Painful Urination, Pelvic Pain and Urgency. Musculoskeletal Present- Back Pain and Joint Pain. Not Present- Joint Stiffness, Muscle Pain, Muscle Weakness and Swelling of Extremities. Neurological Present- Headaches. Not Present- Decreased Memory, Fainting, Numbness, Seizures, Tingling, Tremor, Trouble walking and Weakness. Psychiatric Present- Anxiety. Not Present- Bipolar, Change in Sleep Pattern, Depression, Fearful and Frequent crying. Endocrine Not Present- Cold Intolerance, Excessive Hunger, Hair Changes, Heat Intolerance, Hot flashes and New Diabetes. Hematology Not Present- Easy Bruising, Excessive bleeding, Gland problems, HIV and Persistent Infections.  Vitals (Kristina Dunlap CMA; 06/12/2018 11:22 AM) 06/12/2018 11:21 AM Weight: 126.38 lb Height: 65in Body Surface Area: 1.63  m Body Mass Index: 21.03 kg/m  Temp.: 98.67F  Pulse: 74 (Regular)  P.OX: 98% (Room air) BP: 126/88 (Sitting, Left Arm, Standard)       Physical Exam [name] MD; 06/12/2018 5:19 PM) General Mental Status-Alert. General Appearance-Consistent  with stated age. Hydration-Well hydrated. Voice-Normal.  Head and Neck Head-normocephalic, atraumatic with no lesions or palpable masses. Trachea-midline. Thyroid Gland Characteristics - normal size and consistency.  Eye Eyeball - Bilateral-Extraocular movements intact. Sclera/Conjunctiva - Bilateral-No scleral icterus.  Chest and Lung Exam Chest and lung exam reveals -quiet, even and easy respiratory effort with no use of accessory muscles and on auscultation, normal breath sounds, no adventitious sounds and normal vocal resonance. Inspection Chest Wall - Normal. Back - normal.  Breast Note: left breast with 8-10 mm masslike area at 7 oclock 3 cm from nipple. sl tender. breasts are relatively dense. no nipple retraction. no nipple discharge. no LAD.   Cardiovascular Cardiovascular examination reveals -normal heart sounds, regular rate and rhythm with no murmurs and normal pedal pulses bilaterally.  Abdomen Inspection Inspection of the abdomen reveals - No Hernias. Palpation/Percussion Palpation and Percussion of the abdomen reveal - Soft, Non Tender, No Rebound tenderness, No Rigidity (guarding) and No hepatosplenomegaly. Auscultation Auscultation of the abdomen reveals - Bowel sounds normal.  Neurologic Neurologic evaluation reveals -alert and oriented x 3 with no impairment of recent or remote memory. Mental Status-Normal.  Musculoskeletal Global Assessment -Note: no gross deformities.  Normal Exam - Left-Upper Extremity Strength Normal and Lower Extremity Strength Normal. Normal Exam - Right-Upper Extremity Strength Normal and Lower Extremity Strength  Normal.  Lymphatic Head & Neck  General Head & Neck Lymphatics: Bilateral - Description - Normal. Axillary  General Axillary Region: Bilateral - Description - Normal. Tenderness - Non Tender. Femoral & Inguinal  Generalized Femoral & Inguinal Lymphatics: Bilateral - Description - No Generalized lymphadenopathy.    Assessment & Plan [name] MD; 06/12/2018 5:19 PM) LEFT BREAST MASS (N63.20) Impression: Imaging negative, but patient has discrete mass like area in left breast. Likely fibrocystic breast tissue, but cannot rule out malignancy without tissue. Will plan excisional biopsy.  The surgical procedure was described to the patient. I discussed the incision type and location and that we would need radiology involved on with a wire or seed marker and/or sentinel node.  The risks and benefits of the procedure were described to the patient and she wishes to proceed.  We discussed the risks bleeding, infection, damage to other structures, need for further procedures/surgeries. We discussed the risk of seroma. The patient was advised if the area in the breast in cancer, we may need to go back to surgery for additional tissue to obtain negative margins or for a lymph node biopsy. The patient was advised that these are the most common complications, but that others can occur as well. They were advised against taking aspirin or other anti-inflammatory agents/blood thinners the week before surgery. Current Plans Schedule for Surgery Pt Education - [facility] Breast Biopsy [facility]: discussed with patient and provided information.   Signed by [name], MD (06/12/2018 5:20 PM)

## 2018-07-06 ENCOUNTER — Encounter (HOSPITAL_BASED_OUTPATIENT_CLINIC_OR_DEPARTMENT_OTHER)
Admission: RE | Disposition: A | Discharge status: Home / Self Care | Payer: Self-pay | Source: Ambulatory Visit | Attending: General Surgery

## 2018-07-06 ENCOUNTER — Encounter (HOSPITAL_BASED_OUTPATIENT_CLINIC_OR_DEPARTMENT_OTHER): Payer: Self-pay | Admitting: *Deleted

## 2018-07-06 ENCOUNTER — Ambulatory Visit (HOSPITAL_BASED_OUTPATIENT_CLINIC_OR_DEPARTMENT_OTHER): Payer: BLUE CROSS/BLUE SHIELD | Admitting: Certified Registered"

## 2018-07-06 ENCOUNTER — Ambulatory Visit (HOSPITAL_BASED_OUTPATIENT_CLINIC_OR_DEPARTMENT_OTHER)
Admission: RE | Admit: 2018-07-06 | Discharge: 2018-07-06 | Disposition: A | Discharge status: Home / Self Care | Payer: BLUE CROSS/BLUE SHIELD | Source: Ambulatory Visit | Attending: General Surgery | Admitting: General Surgery

## 2018-07-06 ENCOUNTER — Other Ambulatory Visit: Payer: Self-pay

## 2018-07-06 DIAGNOSIS — F419 Anxiety disorder, unspecified: Secondary | ICD-10-CM | POA: Diagnosis not present

## 2018-07-06 DIAGNOSIS — E7800 Pure hypercholesterolemia, unspecified: Secondary | ICD-10-CM | POA: Insufficient documentation

## 2018-07-06 DIAGNOSIS — I10 Essential (primary) hypertension: Secondary | ICD-10-CM | POA: Insufficient documentation

## 2018-07-06 DIAGNOSIS — N6012 Diffuse cystic mastopathy of left breast: Secondary | ICD-10-CM | POA: Diagnosis not present

## 2018-07-06 DIAGNOSIS — Z79899 Other long term (current) drug therapy: Secondary | ICD-10-CM | POA: Diagnosis not present

## 2018-07-06 DIAGNOSIS — N6320 Unspecified lump in the left breast, unspecified quadrant: Secondary | ICD-10-CM | POA: Diagnosis not present

## 2018-07-06 DIAGNOSIS — N63 Unspecified lump in unspecified breast: Secondary | ICD-10-CM | POA: Diagnosis not present

## 2018-07-06 HISTORY — PX: BREAST BIOPSY: SHX20

## 2018-07-06 HISTORY — DX: Other specified postprocedural states: R11.2

## 2018-07-06 HISTORY — DX: Other specified postprocedural states: Z98.890

## 2018-07-06 SURGERY — BREAST BIOPSY
Anesthesia: General | Site: Breast | Laterality: Left

## 2018-07-06 MED ORDER — PROPOFOL 10 MG/ML IV BOLUS
INTRAVENOUS | Status: AC
  Filled 2018-07-06: qty 20

## 2018-07-06 MED ORDER — CEFAZOLIN SODIUM-DEXTROSE 2-4 GM/100ML-% IV SOLN: 2.0000 g | INTRAVENOUS | Status: DC

## 2018-07-06 MED ORDER — DEXAMETHASONE SODIUM PHOSPHATE 10 MG/ML IJ SOLN
INTRAMUSCULAR | Status: AC
  Filled 2018-07-06: qty 1

## 2018-07-06 MED ORDER — FENTANYL CITRATE (PF) 100 MCG/2ML IJ SOLN
50.0000 ug | INTRAMUSCULAR | Status: DC | PRN
  Administered 2018-07-06: 50 ug via INTRAVENOUS

## 2018-07-06 MED ORDER — CHLORHEXIDINE GLUCONATE CLOTH 2 % EX PADS: 6.0000 | MEDICATED_PAD | Freq: Once | CUTANEOUS | Status: DC

## 2018-07-06 MED ORDER — OXYCODONE HCL 5 MG/5ML PO SOLN: 5.0000 mg | Freq: Once | ORAL | Status: DC | PRN

## 2018-07-06 MED ORDER — PROPOFOL 10 MG/ML IV BOLUS
INTRAVENOUS | Status: DC | PRN
  Administered 2018-07-06: 200 mg via INTRAVENOUS

## 2018-07-06 MED ORDER — MEPERIDINE HCL 25 MG/ML IJ SOLN: 6.2500 mg | INTRAMUSCULAR | Status: DC | PRN

## 2018-07-06 MED ORDER — SCOPOLAMINE 1 MG/3DAYS TD PT72
MEDICATED_PATCH | TRANSDERMAL | Status: AC
  Filled 2018-07-06: qty 1

## 2018-07-06 MED ORDER — MIDAZOLAM HCL 2 MG/2ML IJ SOLN
INTRAMUSCULAR | Status: AC
  Filled 2018-07-06: qty 2

## 2018-07-06 MED ORDER — ACETAMINOPHEN 500 MG PO TABS
1000.0000 mg | ORAL_TABLET | ORAL | Status: AC
  Administered 2018-07-06: 1000 mg via ORAL

## 2018-07-06 MED ORDER — CELECOXIB 200 MG PO CAPS
200.0000 mg | ORAL_CAPSULE | ORAL | Status: AC
  Administered 2018-07-06: 200 mg via ORAL

## 2018-07-06 MED ORDER — SCOPOLAMINE 1 MG/3DAYS TD PT72
1.0000 | MEDICATED_PATCH | Freq: Once | TRANSDERMAL | Status: DC
  Administered 2018-07-06: 1.5 mg via TRANSDERMAL

## 2018-07-06 MED ORDER — ACETAMINOPHEN 500 MG PO TABS
ORAL_TABLET | ORAL | Status: AC
  Filled 2018-07-06: qty 2

## 2018-07-06 MED ORDER — PROMETHAZINE HCL 25 MG/ML IJ SOLN: 6.2500 mg | INTRAMUSCULAR | Status: DC | PRN

## 2018-07-06 MED ORDER — BUPIVACAINE-EPINEPHRINE (PF) 0.25% -1:200000 IJ SOLN
INTRAMUSCULAR | Status: AC
  Filled 2018-07-06: qty 30

## 2018-07-06 MED ORDER — ONDANSETRON HCL 4 MG/2ML IJ SOLN
INTRAMUSCULAR | Status: DC | PRN
  Administered 2018-07-06: 4 mg via INTRAVENOUS

## 2018-07-06 MED ORDER — LIDOCAINE HCL (CARDIAC) PF 100 MG/5ML IV SOSY
PREFILLED_SYRINGE | INTRAVENOUS | Status: DC | PRN
  Administered 2018-07-06: 60 mg via INTRAVENOUS

## 2018-07-06 MED ORDER — OXYCODONE HCL 5 MG PO TABS: 5.0000 mg | ORAL_TABLET | Freq: Once | ORAL | Status: DC | PRN

## 2018-07-06 MED ORDER — CEFAZOLIN SODIUM-DEXTROSE 2-4 GM/100ML-% IV SOLN
INTRAVENOUS | Status: AC
  Filled 2018-07-06: qty 100

## 2018-07-06 MED ORDER — CELECOXIB 200 MG PO CAPS
ORAL_CAPSULE | ORAL | Status: AC
  Filled 2018-07-06: qty 1

## 2018-07-06 MED ORDER — ONDANSETRON HCL 4 MG/2ML IJ SOLN
INTRAMUSCULAR | Status: AC
  Filled 2018-07-06: qty 2

## 2018-07-06 MED ORDER — DEXAMETHASONE SODIUM PHOSPHATE 10 MG/ML IJ SOLN
INTRAMUSCULAR | Status: DC | PRN
  Administered 2018-07-06: 10 mg via INTRAVENOUS

## 2018-07-06 MED ORDER — HYDROMORPHONE HCL 1 MG/ML IJ SOLN: 0.2500 mg | INTRAMUSCULAR | Status: DC | PRN

## 2018-07-06 MED ORDER — LIDOCAINE-EPINEPHRINE (PF) 1 %-1:200000 IJ SOLN
INTRAMUSCULAR | Status: AC
  Filled 2018-07-06: qty 30

## 2018-07-06 MED ORDER — MIDAZOLAM HCL 2 MG/2ML IJ SOLN
1.0000 mg | INTRAMUSCULAR | Status: DC | PRN
  Administered 2018-07-06: 2 mg via INTRAVENOUS

## 2018-07-06 MED ORDER — LIDOCAINE-EPINEPHRINE (PF) 1 %-1:200000 IJ SOLN
INTRAMUSCULAR | Status: DC | PRN
  Administered 2018-07-06: 20 mL

## 2018-07-06 MED ORDER — BUPIVACAINE HCL (PF) 0.5 % IJ SOLN
INTRAMUSCULAR | Status: AC
  Filled 2018-07-06: qty 30

## 2018-07-06 MED ORDER — SCOPOLAMINE 1 MG/3DAYS TD PT72: 1.0000 | MEDICATED_PATCH | Freq: Once | TRANSDERMAL | Status: DC | PRN

## 2018-07-06 MED ORDER — BUPIVACAINE HCL (PF) 0.25 % IJ SOLN
INTRAMUSCULAR | Status: AC
  Filled 2018-07-06: qty 30

## 2018-07-06 MED ORDER — LACTATED RINGERS IV SOLN
INTRAVENOUS | Status: DC
  Administered 2018-07-06: 12:00:00 via INTRAVENOUS

## 2018-07-06 MED ORDER — LIDOCAINE 2% (20 MG/ML) 5 ML SYRINGE
INTRAMUSCULAR | Status: AC
  Filled 2018-07-06: qty 5

## 2018-07-06 MED ORDER — OXYCODONE HCL 5 MG PO TABS: 5.0000 mg | ORAL_TABLET | Freq: Four times a day (QID) | ORAL | 0 refills | Status: DC | PRN

## 2018-07-06 MED ORDER — FENTANYL CITRATE (PF) 100 MCG/2ML IJ SOLN
INTRAMUSCULAR | Status: AC
  Filled 2018-07-06: qty 2

## 2018-07-06 SURGICAL SUPPLY — 56 items
ADH SKN CLS APL DERMABOND .7 (GAUZE/BANDAGES/DRESSINGS) ×1
BINDER BREAST LRG (GAUZE/BANDAGES/DRESSINGS) ×1 IMPLANT
BLADE SURG 15 STRL LF DISP TIS (BLADE) ×1 IMPLANT
BLADE SURG 15 STRL SS (BLADE) ×2
CANISTER SUCT 1200ML W/VALVE (MISCELLANEOUS) ×2 IMPLANT
CHLORAPREP W/TINT 26ML (MISCELLANEOUS) ×2 IMPLANT
CLIP VESOCCLUDE LG 6/CT (CLIP) IMPLANT
COVER BACK TABLE 60X90IN (DRAPES) ×2 IMPLANT
COVER MAYO STAND STRL (DRAPES) ×2 IMPLANT
COVER WAND RF STERILE (DRAPES) IMPLANT
DECANTER SPIKE VIAL GLASS SM (MISCELLANEOUS) IMPLANT
DERMABOND ADVANCED (GAUZE/BANDAGES/DRESSINGS) ×1
DERMABOND ADVANCED .7 DNX12 (GAUZE/BANDAGES/DRESSINGS) ×1 IMPLANT
DEVICE DUBIN W/COMP PLATE 8390 (MISCELLANEOUS) IMPLANT
DRAPE LAPAROTOMY 100X72 PEDS (DRAPES) ×2 IMPLANT
DRAPE UTILITY XL STRL (DRAPES) ×4 IMPLANT
DRSG PAD ABDOMINAL 8X10 ST (GAUZE/BANDAGES/DRESSINGS) IMPLANT
ELECT COATED BLADE 2.86 ST (ELECTRODE) ×2 IMPLANT
ELECT REM PT RETURN 9FT ADLT (ELECTROSURGICAL) ×2
ELECTRODE REM PT RTRN 9FT ADLT (ELECTROSURGICAL) ×1 IMPLANT
GAUZE SPONGE 4X4 12PLY STRL LF (GAUZE/BANDAGES/DRESSINGS) ×2 IMPLANT
GLOVE BIO SURGEON STRL SZ 6 (GLOVE) ×2 IMPLANT
GLOVE BIO SURGEON STRL SZ 6.5 (GLOVE) ×1 IMPLANT
GLOVE BIOGEL PI IND STRL 6.5 (GLOVE) ×1 IMPLANT
GLOVE BIOGEL PI IND STRL 8 (GLOVE) IMPLANT
GLOVE BIOGEL PI INDICATOR 6.5 (GLOVE) ×1
GLOVE BIOGEL PI INDICATOR 8 (GLOVE) ×1
GLOVE SURG SS PI 7.0 STRL IVOR (GLOVE) ×1 IMPLANT
GOWN STRL REUS W/ TWL LRG LVL3 (GOWN DISPOSABLE) ×1 IMPLANT
GOWN STRL REUS W/TWL 2XL LVL3 (GOWN DISPOSABLE) ×2 IMPLANT
GOWN STRL REUS W/TWL LRG LVL3 (GOWN DISPOSABLE) ×2
KIT MARKER MARGIN INK (KITS) IMPLANT
LIGHT WAVEGUIDE WIDE FLAT (MISCELLANEOUS) IMPLANT
NDL HYPO 25X1 1.5 SAFETY (NEEDLE) ×1 IMPLANT
NEEDLE HYPO 25X1 1.5 SAFETY (NEEDLE) ×2 IMPLANT
NS IRRIG 1000ML POUR BTL (IV SOLUTION) ×2 IMPLANT
PACK BASIN DAY SURGERY FS (CUSTOM PROCEDURE TRAY) ×2 IMPLANT
PENCIL BUTTON HOLSTER BLD 10FT (ELECTRODE) ×2 IMPLANT
SLEEVE SCD COMPRESS KNEE MED (MISCELLANEOUS) ×2 IMPLANT
SPONGE LAP 18X18 RF (DISPOSABLE) ×2 IMPLANT
STAPLER VISISTAT 35W (STAPLE) IMPLANT
STRIP CLOSURE SKIN 1/2X4 (GAUZE/BANDAGES/DRESSINGS) ×2 IMPLANT
SUT MON AB 4-0 PC3 18 (SUTURE) ×2 IMPLANT
SUT SILK 2 0 SH (SUTURE) IMPLANT
SUT VIC AB 2-0 SH 27 (SUTURE)
SUT VIC AB 2-0 SH 27XBRD (SUTURE) IMPLANT
SUT VIC AB 3-0 54X BRD REEL (SUTURE) IMPLANT
SUT VIC AB 3-0 BRD 54 (SUTURE)
SUT VIC AB 3-0 SH 27 (SUTURE) ×2
SUT VIC AB 3-0 SH 27X BRD (SUTURE) ×1 IMPLANT
SYR BULB 3OZ (MISCELLANEOUS) ×2 IMPLANT
SYR CONTROL 10ML LL (SYRINGE) ×2 IMPLANT
TOWEL GREEN STERILE FF (TOWEL DISPOSABLE) ×2 IMPLANT
TOWEL OR NON WOVEN STRL DISP B (DISPOSABLE) ×2 IMPLANT
TUBE CONNECTING 20X1/4 (TUBING) ×2 IMPLANT
YANKAUER SUCT BULB TIP NO VENT (SUCTIONS) ×2 IMPLANT

## 2018-07-06 NOTE — Anesthesia Postprocedure Evaluation (Signed)
Anesthesia Post Note  Patient: Kristina Dunlap  Procedure(s) Performed: LEFT BREAST EXCISIONAL BIOSPY (Left Breast)     Patient location during evaluation: PACU Anesthesia Type: General Level of consciousness: awake and alert Pain management: pain level controlled Vital Signs Assessment: post-procedure vital signs reviewed and stable Respiratory status: spontaneous breathing, nonlabored ventilation and respiratory function stable Cardiovascular status: blood pressure returned to baseline and stable Postop Assessment: no apparent nausea or vomiting Anesthetic complications: no    Last Vitals:  Vitals:   07/06/18 1445 07/06/18 1502  BP: (!) 154/80 (!) 153/90  Pulse: 76 81  Resp: 18 18  Temp:  36.5 C  SpO2: 100% 100%    Last Pain:  Vitals:   07/06/18 1502  TempSrc:   PainSc: 2                  Lynda Rainwater

## 2018-07-06 NOTE — Discharge Instructions (Addendum)
[facility] °Office Phone Number [phone] ° °BREAST BIOPSY/ PARTIAL MASTECTOMY: POST OP INSTRUCTIONS ° °Always review your discharge instruction sheet given to you by the facility where your surgery was performed. ° °IF YOU HAVE DISABILITY OR FAMILY LEAVE FORMS, YOU MUST BRING THEM TO THE OFFICE FOR PROCESSING.  DO NOT GIVE THEM TO YOUR DOCTOR. ° °1. A prescription for pain medication may be given to you upon discharge.  Take your pain medication as prescribed, if needed.  If narcotic pain medicine is not needed, then you may take acetaminophen (Tylenol) or ibuprofen (Advil) as needed. °2. Take your usually prescribed medications unless otherwise directed °3. If you need a refill on your pain medication, please contact your pharmacy.  They will contact our office to request authorization.  Prescriptions will not be filled after 5pm or on week-ends. °4. You should eat very light the first 24 hours after surgery, such as soup, crackers, pudding, etc.  Resume your normal diet the day after surgery. °5. Most patients will experience some swelling and bruising in the breast.  Ice packs and a good support bra will help.  Swelling and bruising can take several days to resolve.  °6. It is common to experience some constipation if taking pain medication after surgery.  Increasing fluid intake and taking a stool softener will usually help or prevent this problem from occurring.  A mild laxative (Milk of Magnesia or Miralax) should be taken according to package directions if there are no bowel movements after 48 hours. °7. Unless discharge instructions indicate otherwise, you may remove your bandages 48 hours after surgery, and you may shower at that time.  You may have steri-strips (small skin tapes) in place directly over the incision.  These strips should be left on the skin for 7-10 days.   Any sutures or staples will be removed at the office during your follow-up visit. °8. ACTIVITIES:  You may resume  regular daily activities (gradually increasing) beginning the next day.  Wearing a good support bra or sports bra (or the breast binder) minimizes pain and swelling.  You may have sexual intercourse when it is comfortable. °a. You may drive when you no longer are taking prescription pain medication, you can comfortably wear a seatbelt, and you can safely maneuver your car and apply brakes. °b. RETURN TO WORK:  __________1 week_______________ °9. You should see your doctor in the office for a follow-up appointment approximately two weeks after your surgery.  Your doctor’s nurse will typically make your follow-up appointment when she calls you with your pathology report.  Expect your pathology report 2-3 business days after your surgery.  You may call to check if you do not hear from us after three days. ° ° °WHEN TO CALL YOUR DOCTOR: °1. Fever over 101.0 °2. Nausea and/or vomiting. °3. Extreme swelling or bruising. °4. Continued bleeding from incision. °5. Increased pain, redness, or drainage from the incision. ° °The clinic staff is available to answer your questions during regular business hours.  Please don’t hesitate to call and ask to speak to one of the nurses for clinical concerns.  If you have a medical emergency, go to the nearest emergency room or call 911.  A surgeon from [facility] is always on call at the hospital. ° °For further questions, please visit [email or web address]  ° ° °Post Anesthesia Home Care Instructions ° °Activity: °Get plenty of rest for the remainder of the day. A responsible individual must stay with you for 24   hours following the procedure.  °For the next 24 hours, DO NOT: °-Drive a car °-Operate machinery °-Drink alcoholic beverages °-Take any medication unless instructed by your physician °-Make any legal decisions or sign important papers. ° °Meals: °Start with liquid foods such as gelatin or soup. Progress to regular foods as tolerated. Avoid greasy, spicy,  heavy foods. If nausea and/or vomiting occur, drink only clear liquids until the nausea and/or vomiting subsides. Call your physician if vomiting continues. ° °Special Instructions/Symptoms: °Your throat may feel dry or sore from the anesthesia or the breathing tube placed in your throat during surgery. If this causes discomfort, gargle with warm salt water. The discomfort should disappear within 24 hours. ° °If you had a scopolamine patch placed behind your ear for the management of post- operative nausea and/or vomiting: ° °1. The medication in the patch is effective for 72 hours, after which it should be removed.  Wrap patch in a tissue and discard in the trash. Wash hands thoroughly with soap and water. °2. You may remove the patch earlier than 72 hours if you experience unpleasant side effects which may include dry mouth, dizziness or visual disturbances. °3. Avoid touching the patch. Wash your hands with soap and water after contact with the patch. °  ° °

## 2018-07-06 NOTE — Interval H&P Note (Signed)
History and Physical Interval Note:  07/06/2018 1:12 PM  Kristina Dunlap [name]  has presented today for surgery, with the diagnosis of LEFT BREAST MASS  The various methods of treatment have been discussed with the patient and family. After consideration of risks, benefits and other options for treatment, the patient has consented to  Procedure(s): LEFT BREAST EXCISIONAL BIOSPY (Left) as a surgical intervention .  The patient's history has been reviewed, patient examined, no change in status, stable for surgery.  I have reviewed the patient's chart and labs.  Questions were answered to the patient's satisfaction.     [name]

## 2018-07-06 NOTE — Interval H&P Note (Signed)
History and Physical Interval Note:  07/06/2018 1:12 PM  Kristina Dunlap [name]  has presented today for surgery, with the diagnosis of LEFT BREAST MASS  The various methods of treatment have been discussed with the patient and family. After consideration of risks, benefits and other options for treatment, the patient has consented to  Procedure(s): LEFT BREAST EXCISIONAL BIOSPY (Left) as a surgical intervention .  The patient's history has been reviewed, patient examined, no change in status, stable for surgery.  I have reviewed the patient's chart and labs.  Questions were answered to the patient's satisfaction.     [name]

## 2018-07-06 NOTE — Anesthesia Preprocedure Evaluation (Signed)
Anesthesia Evaluation  Patient identified by MRN, date of birth, ID band Patient awake    Reviewed: Allergy & Precautions, H&P , NPO status , Patient's Chart, lab work & pertinent test results  History of Anesthesia Complications (+) PONV  Airway Mallampati: II   Neck ROM: full    Dental   Pulmonary neg pulmonary ROS,    breath sounds clear to auscultation       Cardiovascular hypertension, Pt. on medications  Rhythm:regular Rate:Normal     Neuro/Psych  Headaches, Anxiety    GI/Hepatic   Endo/Other    Renal/GU      Musculoskeletal   Abdominal   Peds  Hematology  (+) anemia ,   Anesthesia Other Findings   Reproductive/Obstetrics                             Anesthesia Physical  Anesthesia Plan  ASA: II  Anesthesia Plan: General   Post-op Pain Management:    Induction: Intravenous  PONV Risk Score and Plan: 4 or greater and Ondansetron, Dexamethasone, Midazolam and Scopolamine patch - Pre-op  Airway Management Planned: LMA  Additional Equipment:   Intra-op Plan:   Post-operative Plan: Extubation in OR  Informed Consent: I have reviewed the patients History and Physical, chart, labs and discussed the procedure including the risks, benefits and alternatives for the proposed anesthesia with the patient or authorized representative who has indicated his/her understanding and acceptance.     Plan Discussed with: CRNA, Anesthesiologist and Surgeon  Anesthesia Plan Comments:         Anesthesia Quick Evaluation

## 2018-07-06 NOTE — Op Note (Signed)
Excisional Breast Biopsy  Indications: This patient presents with history of persistent left breast mass,imaging negative  Pre-operative Diagnosis: left breast mass  Post-operative Diagnosis: left breast mass  Surgeon: [name]   Anesthesia: General LMA anesthesia and Local anesthesia 1% plain lidocaine, 0.25.% bupivacaine, with epinephrine  ASA Class: 2  Procedure Details  The patient was seen in the Holding Room. The risks, benefits, complications, treatment options, and expected outcomes were discussed with the patient. The possibilities of reaction to medication, pulmonary aspiration, bleeding, infection, the need for additional procedures, failure to diagnose a condition, and creating a complication requiring transfusion or operation were discussed with the patient. The patient concurred with the proposed plan, giving informed consent.  The site of surgery properly noted/marked. The patient was taken to Operating Room # 1, identified, and the procedure verified as Breast Excisional Biopsy. A Time Out was held and the above information confirmed.  After induction of anesthesia, the left  breast and chest were prepped and draped in standard fashion. The lumpectomy was performed by creating a transverse incision over the lower inner quadrant of the breast just over the mass.  Dissection was carried down around the mass with the cautery.   Hemostasis was achieved with cautery.  The wound was irrigated and closed with a 3-0 Vicryl interrupted deep dermal stitch and a 4-0 Monocryl subcuticular closure in layers.    Sterile dressings were applied. At the end of the operation, all sponge, instrument, and needle counts were correct.  Findings: grossly clear surgical margins  Estimated Blood Loss:  Minimal             Specimens: left breast biopsy         Complications:  None; patient tolerated the procedure well.         Disposition: PACU - hemodynamically stable.         Condition:  stable

## 2018-07-06 NOTE — Transfer of Care (Signed)
Immediate Anesthesia Transfer of Care Note  Patient: Kristina Dunlap  Procedure(s) Performed: LEFT BREAST EXCISIONAL BIOSPY (Left Breast)  Patient Location: PACU  Anesthesia Type:General  Level of Consciousness: awake, alert  and oriented  Airway & Oxygen Therapy: Patient Spontanous Breathing and Patient connected to face mask oxygen  Post-op Assessment: Report given to RN and Post -op Vital signs reviewed and stable  Post vital signs: Reviewed and stable  Last Vitals:  Vitals Value Taken Time  BP 131/84 07/06/2018  2:11 PM  Temp    Pulse 70 07/06/2018  2:12 PM  Resp 20 07/06/2018  2:12 PM  SpO2 100 % 07/06/2018  2:12 PM  Vitals shown include unvalidated device data.  Last Pain:  Vitals:   07/06/18 1121  TempSrc: Oral  PainSc: 0-No pain         Complications: No apparent anesthesia complications

## 2018-07-07 ENCOUNTER — Encounter (HOSPITAL_BASED_OUTPATIENT_CLINIC_OR_DEPARTMENT_OTHER): Payer: Self-pay | Admitting: General Surgery

## 2018-07-12 DIAGNOSIS — N61 Mastitis without abscess: Secondary | ICD-10-CM | POA: Diagnosis not present

## 2018-07-12 DIAGNOSIS — J0100 Acute maxillary sinusitis, unspecified: Secondary | ICD-10-CM | POA: Diagnosis not present

## 2018-07-19 NOTE — Progress Notes (Deleted)
NEUROLOGY FOLLOW UP OFFICE NOTE  Kristina Dunlap [id number]  HISTORY OF PRESENT ILLNESS: Kristina Dunlap is a 52 year old right-handed female with hypercholesterolemia, hypertension, and GERD who follows up for migraines.  UPDATE: Intensity:  *** Duration:  *** Frequency:  *** Frequency of abortive medication: *** Current NSAIDS: No (contraindicated due to stomach ulcer) Current analgesics: Tylenol Current triptans: Sumatriptan 100 mg Current ergotamine: None Current anti-emetic: None Current muscle relaxants: None Current anti-anxiolytic: None Current sleep aide: None Current Antihypertensive medications: Lisinopril- HCTZ Current Antidepressant medications: Nortriptyline 10 mg at bedtime, citalopram 60 mg Current Anticonvulsant medications: None Current anti-CGRP: None Current Vitamins/Herbal/Supplements: None Current Antihistamines/Decongestants: Benadryl, Zyrtec, Flonase Other therapy: None Hormone/birth control: None  Caffeine: Decaf tea, no coffee, occasional soda Diet: Water Exercise: No Depression: No; Anxiety: No Other pain: No Sleep hygiene: Good  HISTORY: Onset: Headaches since 52 years old but became daily in 2000 Location:  Bilateral periorbital/temples radiating into neck Quality:  throbbing Initial intensity:  Moderate to severe.  She denies new headache, thunderclap headache or severe headache that wakes her from sleep. Aura:  no Prodrome:  no Postdrome:  no Associated symptoms: Photophobia, phonophobia.  She denies associated nausea, vomiting, visual disturbance, unilateral numbness or weakness. Initial duration:  All day Initial Frequency:  Daily (severe headaches occur once a month) Initial Frequency of abortive medication: Tylenol 3 days a week Triggers: None Exacerbating factors:  Bending over Relieving factors:  no Activity:  Aggravated only when severe  Past NSAIDS:  Ibuprofen, naproxen Past analgesics:  Goody powder, Fioricet,  Excedrin Past abortive triptans:  no Past abortive ergotamine:  no Past muscle relaxants:  tizanidine 4mg , balcofen 10mg  Past anti-emetic:  Zofran ODT 4mg , promethazine suppository 25mg  Past antihypertensive medications:  HCTZ Past antidepressant medications:  bupropion Past anticonvulsant medications:   Trokendi (dizziness) Past anti-CGRP:  no Past vitamins/Herbal/Supplements:  no Past antihistamines/decongestants:  no Other past therapies:  no  Family history of headache:  No  PAST MEDICAL HISTORY: Past Medical History:  Diagnosis Date  . Anemia   . Fibroid   . Gastric ulcer   . Hemorrhoids   . Hyperlipidemia   . Hypertension   . Menorrhagia   . Migraine headache with aura 2012  . OCD (obsessive compulsive disorder)   . PONV (postoperative nausea and vomiting)     MEDICATIONS: Current Outpatient Medications on File Prior to Visit  Medication Sig Dispense Refill  . CALCIUM PO Take by mouth.    . Camphor-Menthol (TIGER BALM EXTRA STRENGTH EX) Apply 1 application topically as needed (muscle pain).    . cetirizine (ZYRTEC) 10 MG tablet Take 10 mg by mouth daily.    . Cholecalciferol (VITAMIN D PO) Take 5,000 Int'l Units by mouth daily.    . citalopram (CELEXA) 40 MG tablet Take 40 mg by mouth daily.    . fluticasone (FLONASE) 50 MCG/ACT nasal spray Place 2 sprays into both nostrils daily.     Marland Kitchen ketotifen (ZADITOR) 0.025 % ophthalmic solution Place 1 drop into both eyes as needed.    Marland Kitchen lisinopril-hydrochlorothiazide (PRINZIDE,ZESTORETIC) 20-12.5 MG per tablet Take 1 tablet by mouth daily.    . Misc Natural Products (OSTEO BI-FLEX JOINT SHIELD PO) Take 1 tablet by mouth daily.     . Multiple Vitamins-Minerals (MULTIVITAMIN WITH MINERALS) tablet Take 1 tablet by mouth daily.    Marland Kitchen omeprazole (PRILOSEC) 40 MG capsule   3  . oxyCODONE (OXY IR/ROXICODONE) 5 MG immediate release tablet Take 1 tablet (5 mg total) by mouth every  6 (six) hours as needed for severe pain. 10 tablet 0  .  pravastatin (PRAVACHOL) 20 MG tablet Take 20 mg by mouth once a week.    Marland Kitchen Propylene Glycol (SYSTANE BALANCE OP) Apply 1 drop to eye as needed (dry eyes).    . SUMAtriptan (IMITREX) 100 MG tablet Take 1 tablet earliest onset of migraine.  May repeat once in 2 hours if headache persists or recurs. 10 tablet 3  . vitamin B-12 (CYANOCOBALAMIN) 500 MCG tablet Take 500 mcg by mouth daily.     No current facility-administered medications on file prior to visit.     ALLERGIES: Allergies  Allergen Reactions  . Erythromycin Nausea Only    FAMILY HISTORY: Family History  Problem Relation Age of Onset  . Heart disease Father   . Hyperlipidemia Mother   . Dementia Mother   . Heart disease Maternal Grandmother   . Heart disease Maternal Grandfather    SOCIAL HISTORY: Social History   Socioeconomic History  . Marital status: Married    Spouse name: Skip Estimable  . Number of children: 0  . Years of education: Not on file  . Highest education level: 12th grade  Occupational History  . Occupation: Comptroller: [employer]  . Financial resource strain: Not on file  . Food insecurity:    Worry: Not on file    Inability: Not on file  . Transportation needs:    Medical: Not on file    Non-medical: Not on file  Tobacco Use  . Smoking status: Never Smoker  . Smokeless tobacco: Never Used  Substance and Sexual Activity  . Alcohol use: Not Currently  . Drug use: No  . Sexual activity: Yes    Partners: Female    Birth control/protection: Surgical    Comment: R-TLH  Lifestyle  . Physical activity:    Days per week: Not on file    Minutes per session: Not on file  . Stress: Not on file  Relationships  . Social connections:    Talks on phone: Not on file    Gets together: Not on file    Attends religious service: Not on file    Active member of club or organization: Not on file    Attends meetings of clubs or organizations: Not on file     Relationship status: Not on file  . Intimate partner violence:    Fear of current or ex partner: Not on file    Emotionally abused: Not on file    Physically abused: Not on file    Forced sexual activity: Not on file  Other Topics Concern  . Not on file  Social History Narrative   Patient is right-handed. She is married to her same sex partner. She occasionally drinks tea or soda. No exercise.    REVIEW OF SYSTEMS: Constitutional: No fevers, chills, or sweats, no generalized fatigue, change in appetite Eyes: No visual changes, double vision, eye pain Ear, nose and throat: No hearing loss, ear pain, nasal congestion, sore throat Cardiovascular: No chest pain, palpitations Respiratory:  No shortness of breath at rest or with exertion, wheezes GastrointestinaI: No nausea, vomiting, diarrhea, abdominal pain, fecal incontinence Genitourinary:  No dysuria, urinary retention or frequency Musculoskeletal:  No neck pain, back pain Integumentary: No rash, pruritus, skin lesions Neurological: as above Psychiatric: No depression, insomnia, anxiety Endocrine: No palpitations, fatigue, diaphoresis, mood swings, change in appetite, change in weight, increased thirst Hematologic/Lymphatic:  No  purpura, petechiae. Allergic/Immunologic: no itchy/runny eyes, nasal congestion, recent allergic reactions, rashes  PHYSICAL EXAM: *** General: No acute distress.  Patient appears ***-groomed.  *** body habitus. Head:  Normocephalic/atraumatic Eyes:  Fundi examined but not visualized Neck: supple, no paraspinal tenderness, full range of motion Heart:  Regular rate and rhythm Lungs:  Clear to auscultation bilaterally Back: No paraspinal tenderness Neurological Exam: alert and oriented to person, place, and time. Attention span and concentration intact, recent and remote memory intact, fund of knowledge intact.  Speech fluent and not dysarthric, language intact.  CN II-XII intact. Bulk and tone normal,  muscle strength 5/5 throughout.  Sensation to light touch, temperature and vibration intact.  Deep tendon reflexes 2+ throughout, toes downgoing.  Finger to nose and heel to shin testing intact.  Gait normal, Romberg negative.  IMPRESSION: ***Migraine without aura, without status migrainosus, not intractable Cervicalgia  PLAN: 1.  For preventative management, *** 2.  For abortive therapy, *** 3.  Limit use of pain relievers to no more than 2 days out of week to prevent risk of rebound or medication-overuse headache. 4.  Keep headache diary 5.  Exercise, hydration, caffeine cessation, sleep hygiene, monitor for and avoid triggers 6.  Consider:  magnesium citrate 400mg  daily, riboflavin 400mg  daily, and coenzyme Q10 100mg  three times daily 7.  Follow up ***  Metta Clines, DO  CC: Maurice Small, MD

## 2018-07-24 ENCOUNTER — Ambulatory Visit: Payer: BLUE CROSS/BLUE SHIELD | Admitting: Neurology

## 2018-08-03 ENCOUNTER — Ambulatory Visit: Payer: BLUE CROSS/BLUE SHIELD | Admitting: Family Medicine

## 2018-09-06 DIAGNOSIS — E7800 Pure hypercholesterolemia, unspecified: Secondary | ICD-10-CM | POA: Diagnosis not present

## 2018-10-16 DIAGNOSIS — J111 Influenza due to unidentified influenza virus with other respiratory manifestations: Secondary | ICD-10-CM | POA: Diagnosis not present

## 2018-10-16 DIAGNOSIS — R509 Fever, unspecified: Secondary | ICD-10-CM | POA: Diagnosis not present

## 2018-11-23 DIAGNOSIS — F3342 Major depressive disorder, recurrent, in full remission: Secondary | ICD-10-CM | POA: Diagnosis not present

## 2018-11-27 ENCOUNTER — Telehealth: Payer: Self-pay | Admitting: Neurology

## 2018-11-27 NOTE — Telephone Encounter (Signed)
Patient called regarding having a headache for 3 days now and cannot get it to go away. She was seen last in 03/20/2018. Please Call. Thanks

## 2018-11-27 NOTE — Telephone Encounter (Signed)
Called and discussed with Pt what she has been taking for her headache, she has used Goody powder, she tried sumatriptan which did lessen the headache, but she did not repeat the dose. Pt scheduled a virtual visit.

## 2018-11-28 ENCOUNTER — Telehealth (INDEPENDENT_AMBULATORY_CARE_PROVIDER_SITE_OTHER): Payer: BLUE CROSS/BLUE SHIELD | Admitting: Neurology

## 2018-11-28 ENCOUNTER — Other Ambulatory Visit: Payer: Self-pay

## 2018-11-28 ENCOUNTER — Encounter: Payer: Self-pay | Admitting: Neurology

## 2018-11-28 VITALS — Ht 65.0 in | Wt 129.0 lb

## 2018-11-28 DIAGNOSIS — G43119 Migraine with aura, intractable, without status migrainosus: Secondary | ICD-10-CM | POA: Diagnosis not present

## 2018-11-28 MED ORDER — ERENUMAB-AOOE 70 MG/ML ~~LOC~~ SOAJ: 70.0000 mg | SUBCUTANEOUS | 5 refills | Status: DC

## 2018-11-28 MED ORDER — PREDNISONE 10 MG PO TABS: ORAL_TABLET | ORAL | 0 refills | Status: DC

## 2018-11-28 NOTE — Progress Notes (Signed)
Virtual Visit via Video Note The purpose of this virtual visit is to provide medical care while limiting exposure to the novel coronavirus.    Consent was obtained for video visit:  Yes.   Answered questions that patient had about telehealth interaction:  Yes.   I discussed the limitations, risks, security and privacy concerns of performing an evaluation and management service by telemedicine. I also discussed with the patient that there may be a patient responsible charge related to this service. The patient expressed understanding and agreed to proceed.  Pt location: Home Physician Location: office Name of referring provider:  Maurice Small, MD I connected with Kristina Dunlap at patients initiation/request on 11/28/2018 at 11:30 AM EDT by video enabled telemedicine application and verified that I am speaking with the correct person using two identifiers. Pt MRN:  [id number] Pt DOB:  1966/03/01 Video Participants:  Kristina Dunlap   History of Present Illness:  Kristina Dunlap is a 53 year old right-handed woman with hypercholesterolemia, hypertension, and GERD who follows up for migraines.  UPDATE: She tried Trokendi XR which was discontinued due to side effects.  She was then started on nortriptyline but it increased the headaches.  Headaches were actually well controlled until January.  Beginning in January, they were moderate to severe, lasting all day and occurring twice a month.  In January, she had a severe one associated with vomiting, which was new.  She reports increased neck pain.  She treated it with Tylenol (ineffective) or Goody (effective).  She never tried the sumatriptan.  For the past 3 to 4 days, she has had an intractable headache.  Sumatriptan lessened the intensity but did not abort it.  However, she did not repeat the dose.   Current NSAIDS:  No (contraindicated as she has a stomach ulcer) Current analgesics:  Goody powder, Tylenol (ineffective) Current triptans:    Sumatriptan 100 mg Current ergotamine:  no Current anti-emetic:  no Current muscle relaxants:  no Current anti-anxiolytic:  no Current sleep aide:  no Current Antihypertensive medications:  lisinopril-HCTZ Current Antidepressant medications:   citalopram 60mg  Current Anticonvulsant medications:   None Current anti-CGRP:  no Current Vitamins/Herbal/Supplements:  vitamin D Current Antihistamines/Decongestants:  Zyrtec, Astelin NS Other therapy:  no  Caffeine:  Decaf tea, no coffee, occasional soda Alcohol:  Occasional  Smoker:  no Diet:  water Exercise:  no Depression:  no; Anxiety:  no Other pain:  no Sleep hygiene:  good  HISTORY:  Onset: Headaches since 53 years old but became daily in 2000. Location:  Bilateral periorbital/temples radiating into neck Quality:  throbbing Initial intensity:  Moderate to severe.  She denies new headache, thunderclap headache or severe headache that wakes her from sleep. Aura:  no Prodrome:  no Postdrome:  no Associated symptoms: Photophobia, phonophobia.  She denies associated nausea, vomiting, visual disturbance, unilateral numbness or weakness. Initial duration:  All day Initial Frequency:  Daily (severe headaches occur once a month) Initial Frequency of abortive medication: Tylenol 3 days a week Triggers: None Aggravating factors: Bending over Relieving factors: None Activity:  Aggravated only when severe  Past NSAIDS:  Ibuprofen, naproxen Past analgesics:  Goody powder, Fioricet, Excedrin Past abortive triptans:  no Past abortive ergotamine:  no Past muscle relaxants:  tizanidine 4mg , balcofen 10mg  Past anti-emetic:  Zofran ODT 4mg , promethazine suppository 25mg  Past antihypertensive medications:  lisinopril, HCTZ Past antidepressant medications:  bupropion Past anticonvulsant medications:   Trokendi XR (made her feel dizzy) Past anti-CGRP:  no Past  vitamins/Herbal/Supplements:  no Past antihistamines/decongestants:  Flonase,  Benadryl Other past therapies:  no  Family history of headache:  No  Past Medical History: Past Medical History:  Diagnosis Date  . Anemia   . Fibroid   . Gastric ulcer   . Hemorrhoids   . Hyperlipidemia   . Hypertension   . Menorrhagia   . Migraine headache with aura 2012  . OCD (obsessive compulsive disorder)   . PONV (postoperative nausea and vomiting)     Medications: Outpatient Encounter Medications as of 11/28/2018  Medication Sig Note  . Ascorbic Acid (VITAMIN C) 1000 MG tablet Take 1,000 mg by mouth daily.   Marland Kitchen zinc gluconate 50 MG tablet Take 50 mg by mouth daily.   Marland Kitchen azelastine (ASTELIN) 0.1 % nasal spray USE 1 SPRAY(S) IN EACH NOSTRIL TWICE DAILY   . Camphor-Menthol (TIGER BALM EXTRA STRENGTH EX) Apply 1 application topically as needed (muscle pain).   . cetirizine (ZYRTEC) 10 MG tablet Take 10 mg by mouth daily.   . Cholecalciferol (VITAMIN D PO) Take 5,000 Int'l Units by mouth daily.   . citalopram (CELEXA) 40 MG tablet Take 40 mg by mouth daily.   Marland Kitchen ketotifen (ZADITOR) 0.025 % ophthalmic solution Place 1 drop into both eyes as needed.   . Misc Natural Products (OSTEO BI-FLEX JOINT SHIELD PO) Take 1 tablet by mouth daily.    Marland Kitchen omeprazole (PRILOSEC) 40 MG capsule    . pravastatin (PRAVACHOL) 20 MG tablet Take 20 mg by mouth once a week.   Marland Kitchen Propylene Glycol (SYSTANE BALANCE OP) Apply 1 drop to eye as needed (dry eyes).   . SUMAtriptan (IMITREX) 100 MG tablet Take 1 tablet earliest onset of migraine.  May repeat once in 2 hours if headache persists or recurs.   . vitamin B-12 (CYANOCOBALAMIN) 500 MCG tablet Take 500 mcg by mouth daily.   . [DISCONTINUED] CALCIUM PO Take by mouth.   . [DISCONTINUED] fluticasone (FLONASE) 50 MCG/ACT nasal spray Place 2 sprays into both nostrils daily.  01/30/2014: Received from: External Pharmacy  . [DISCONTINUED] lisinopril-hydrochlorothiazide (PRINZIDE,ZESTORETIC) 20-12.5 MG per tablet Take 1 tablet by mouth daily.   . [DISCONTINUED]  Multiple Vitamins-Minerals (MULTIVITAMIN WITH MINERALS) tablet Take 1 tablet by mouth daily.   . [DISCONTINUED] oxyCODONE (OXY IR/ROXICODONE) 5 MG immediate release tablet Take 1 tablet (5 mg total) by mouth every 6 (six) hours as needed for severe pain.    No facility-administered encounter medications on file as of 11/28/2018.     Allergies: Allergies  Allergen Reactions  . Erythromycin Nausea Only    Family History: Family History  Problem Relation Age of Onset  . Heart disease Father   . Hyperlipidemia Mother   . Dementia Mother   . Heart disease Maternal Grandmother   . Heart disease Maternal Grandfather     Social History: Social History   Socioeconomic History  . Marital status: Married    Spouse name: Skip Estimable  . Number of children: 0  . Years of education: Not on file  . Highest education level: 12th grade  Occupational History  . 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 Financial resource strain: Not on file  . Food insecurity:    Worry: Not on file    Inability: Not on file  . Transportation needs:    Medical: Not on file    Non-medical: Not on file  Tobacco Use  . Smoking status: Never Smoker  . Smokeless  tobacco: Never Used  Substance and Sexual Activity  . Alcohol use: Not Currently  . Drug use: No  . Sexual activity: Yes    Partners: Female    Birth control/protection: Surgical    Comment: R-TLH  Lifestyle  . Physical activity:    Days per week: Not on file    Minutes per session: Not on file  . Stress: Not on file  Relationships  . Social connections:    Talks on phone: Not on file    Gets together: Not on file    Attends religious service: Not on file    Active member of club or organization: Not on file    Attends meetings of clubs or organizations: Not on file    Relationship status: Not on file  . Intimate partner violence:    Fear of current or ex partner: Not on file    Emotionally abused:  Not on file    Physically abused: Not on file    Forced sexual activity: Not on file  Other Topics Concern  . Not on file  Social History Narrative   Patient is right-handed. She is married to her same sex partner. She occasionally drinks tea or soda. No exercise.    Review Of Systems: Review of Systems  Constitutional: Negative for chills, fever and malaise/fatigue.  HENT: Negative for congestion, ear discharge, ear pain, hearing loss, nosebleeds, sore throat and tinnitus.   Eyes: Negative for blurred vision, double vision, pain, discharge and redness.  Respiratory: Negative for cough, sputum production, shortness of breath and wheezing.   Cardiovascular: Negative for chest pain, palpitations and orthopnea.  Gastrointestinal: Negative for abdominal pain, constipation, diarrhea, nausea and vomiting.  Genitourinary: Negative for dysuria.  Musculoskeletal: Positive for neck pain.  Skin: Negative for rash.  Neurological: Positive for headaches. Negative for tremors, sensory change, speech change and focal weakness.  Endo/Heme/Allergies: Negative for environmental allergies. Does not bruise/bleed easily.  Psychiatric/Behavioral: Negative for depression. The patient is not nervous/anxious.    Observations/Objective:   Height 5\' 5"  (1.651 m), weight 129 lb (58.5 kg), last menstrual period 11/25/2009. alert and oriented to person, place, and time. Attention span and concentration intact, recent and remote memory intact, fund of knowledge intact.  Speech fluent and not dysarthric, language intact.  Eyes orthophoric, moves in all directions.  Face symmetric.  Assessment and Plan:   Migraine without aura, without status migrainosus, intractable  1.  To recurrent intractable headache, will prescribe prednisone taper 2. For preventative management, will start Aimovig 70 mg monthly 3.  For abortive therapy, she will still give sumatriptan another chance.  If ineffective, try Maxalt. 4.  Limit  use of pain relievers to no more than 2 days out of week to prevent risk of rebound or medication-overuse headache. 5.  Keep headache diary 6.  Exercise, hydration, caffeine cessation, sleep hygiene, monitor for and avoid triggers 7.  Consider:  magnesium citrate 400mg  daily, riboflavin 400mg  daily, and coenzyme Q10 100mg  three times daily 8.  Follow up in 4 months.  Follow Up Instructions:    -I discussed the assessment and treatment plan with the patient. The patient was provided an opportunity to ask questions and all were answered. The patient agreed with the plan and demonstrated an understanding of the instructions.   The patient was advised to call back or seek an in-person evaluation if the symptoms worsen or if the condition fails to improve as anticipated.  Dudley Major, DO

## 2018-11-30 ENCOUNTER — Telehealth: Payer: Self-pay | Admitting: Neurology

## 2018-11-30 NOTE — Telephone Encounter (Signed)
Called and spoke with Pt. She was taking prednisone per directions on blister pack. She didn't understand to take all tablets each day at once. Went over hydration and rest. Pt states does feel some better today. She will rest and take prednisone as directed.

## 2018-11-30 NOTE — Telephone Encounter (Signed)
Patient is calling in about the Aimovig/ prednisone medications -she took her last does last night and threw up. Is there anything else she can take because these aren't helping her. Thanks!

## 2018-12-11 ENCOUNTER — Ambulatory Visit (INDEPENDENT_AMBULATORY_CARE_PROVIDER_SITE_OTHER): Payer: BLUE CROSS/BLUE SHIELD | Admitting: Obstetrics and Gynecology

## 2018-12-11 ENCOUNTER — Other Ambulatory Visit: Payer: Self-pay

## 2018-12-11 ENCOUNTER — Encounter: Payer: Self-pay | Admitting: Obstetrics and Gynecology

## 2018-12-11 ENCOUNTER — Telehealth: Payer: Self-pay | Admitting: Obstetrics and Gynecology

## 2018-12-11 VITALS — BP 128/76 | HR 76 | Temp 98.7°F | Resp 14 | Ht 64.5 in | Wt 131.0 lb

## 2018-12-11 DIAGNOSIS — R309 Painful micturition, unspecified: Secondary | ICD-10-CM | POA: Diagnosis not present

## 2018-12-11 DIAGNOSIS — R310 Gross hematuria: Secondary | ICD-10-CM | POA: Diagnosis not present

## 2018-12-11 DIAGNOSIS — N949 Unspecified condition associated with female genital organs and menstrual cycle: Secondary | ICD-10-CM

## 2018-12-11 DIAGNOSIS — R3915 Urgency of urination: Secondary | ICD-10-CM

## 2018-12-11 DIAGNOSIS — N3941 Urge incontinence: Secondary | ICD-10-CM

## 2018-12-11 LAB — POCT URINALYSIS DIPSTICK
Bilirubin, UA: NEGATIVE
Glucose, UA: NEGATIVE
Ketones, UA: NEGATIVE
Nitrite, UA: NEGATIVE
Protein, UA: POSITIVE — AB
Urobilinogen, UA: 0.2 E.U./dL
pH, UA: 6 (ref 5.0–8.0)

## 2018-12-11 MED ORDER — PHENAZOPYRIDINE HCL 200 MG PO TABS: 200.0000 mg | ORAL_TABLET | Freq: Three times a day (TID) | ORAL | 0 refills | Status: DC | PRN

## 2018-12-11 MED ORDER — SULFAMETHOXAZOLE-TRIMETHOPRIM 800-160 MG PO TABS: 1.0000 | ORAL_TABLET | Freq: Two times a day (BID) | ORAL | 0 refills | Status: DC

## 2018-12-11 NOTE — Progress Notes (Signed)
GYNECOLOGY  VISIT   HPI: 52 y.o.   Married Other or two or more races Not Hispanic or Latino  female   [place] with Patient's last menstrual period was 11/25/2009.   here for vaginal irritation, pain after urination, and bleeding. Vaginal swelling since Friday. She noticed swelling and redness inside of the labia. She started noticing spotting on Saturday. She had a hysterectomy. She is now bleeding like a light period.  Sexually active with her husband, no concerns for STD's. She is having some urinary urgency, no increase in urinary frequency. Some urge incontinence.  Hurts externally with voiding. No other vaginal d/c.   Urine: large amount of RBC and WBC, positive Protein -- need more urine if sending out for culture/micro  GYNECOLOGIC HISTORY: Patient's last menstrual period was 11/25/2009. Contraception:Hysterectomy Menopausal hormone therapy: none        OB History    Gravida  0   Para  0   Term  0   Preterm  0   AB  0   Living  0     SAB  0   TAB  0   Ectopic  0   Multiple  0   Live Births                 Patient Active Problem List   Diagnosis Date Noted  . Scapular dyskinesis 04/19/2018  . Nonallopathic lesion of cervical region 04/19/2018  . Nonallopathic lesion of rib cage 04/19/2018  . Nonallopathic lesion of thoracic region 04/19/2018  . Left breast mass 03/04/2018  . Bursitis of right shoulder 02/28/2018  . Pain in right foot 01/25/2018  . Chronic right shoulder pain 01/25/2018  . Acute appendicitis 10/31/2015  . Hypertension   . Migraine headache with aura   . OCD (obsessive compulsive disorder)   . Hemorrhoids 08/30/2012    Past Medical History:  Diagnosis Date  . Anemia   . Fibroid   . Gastric ulcer   . Hemorrhoids   . Hyperlipidemia   . Hypertension   . Menorrhagia   . Migraine headache with aura 2012  . OCD (obsessive compulsive disorder)   . PONV (postoperative nausea and vomiting)     Past Surgical History:  Procedure  Laterality Date  . APPENDECTOMY    . BREAST BIOPSY Left 07/06/2018   Procedure: LEFT BREAST EXCISIONAL BIOSPY;  Surgeon: [name], MD;  Location: [place];  Service: General;  Laterality: Left;  . DILATATION & CURRETTAGE/HYSTEROSCOPY WITH RESECTOCOPE  12/17/08   of polyps  . DILATION AND CURETTAGE OF UTERUS    . HYSTEROSCOPY  12/17/08   resection polyps  . LAPAROSCOPIC APPENDECTOMY N/A 10/31/2015   Procedure: APPENDECTOMY LAPAROSCOPIC;  Surgeon: Erroll Luna, MD;  Location: [facility];  Service: General;  Laterality: N/A;  . ROBOTIC ASSISTED TOTAL HYSTERECTOMY  11/25/09   simple hyperplasia, fibroids  . TONSILLECTOMY      Current Outpatient Medications  Medication Sig Dispense Refill  . amLODipine (NORVASC) 5 MG tablet Take 5 mg by mouth daily.    . Ascorbic Acid (VITAMIN C) 1000 MG tablet Take 1,000 mg by mouth daily.    . Aspirin-Acetaminophen-Caffeine (GOODYS EXTRA STRENGTH PO) Take by mouth.    Marland Kitchen azelastine (ASTELIN) 0.1 % nasal spray USE 1 SPRAY(S) IN EACH NOSTRIL TWICE DAILY    . Camphor-Menthol (TIGER BALM EXTRA STRENGTH EX) Apply 1 application topically as needed (muscle pain).    . cetirizine (ZYRTEC) 10 MG tablet Take 10 mg by mouth  daily.    . Cholecalciferol (VITAMIN D PO) Take 5,000 Int'l Units by mouth daily.    . citalopram (CELEXA) 40 MG tablet Take 40 mg by mouth daily.    . diphenhydrAMINE HCl (BENADRYL ALLERGY PO) Take by mouth.    Eduard Roux (AIMOVIG) 70 MG/ML SOAJ Inject 70 mg into the skin every 30 (thirty) days. 1 pen 5  . ketotifen (ZADITOR) 0.025 % ophthalmic solution Place 1 drop into both eyes as needed.    . Misc Natural Products (OSTEO BI-FLEX JOINT SHIELD PO) Take 1 tablet by mouth daily.     Marland Kitchen omeprazole (PRILOSEC) 40 MG capsule   3  . pravastatin (PRAVACHOL) 20 MG tablet Take 20 mg by mouth once a week.    . Probiotic Product (PROBIOTIC-10 PO) Take by mouth.    . Propylene Glycol (SYSTANE BALANCE OP) Apply 1 drop to eye as needed  (dry eyes).    . SUMAtriptan (IMITREX) 100 MG tablet Take 1 tablet earliest onset of migraine.  May repeat once in 2 hours if headache persists or recurs. 10 tablet 3  . vitamin B-12 (CYANOCOBALAMIN) 500 MCG tablet Take 500 mcg by mouth daily.    Marland Kitchen zinc gluconate 50 MG tablet Take 50 mg by mouth daily.     No current facility-administered medications for this visit.      ALLERGIES: Erythromycin  Family History  Problem Relation Age of Onset  . Heart disease Father   . Hyperlipidemia Mother   . Dementia Mother   . Heart disease Maternal Grandmother   . Heart disease Maternal Grandfather     Social History   Socioeconomic History  . Marital status: Married    Spouse name: Skip Estimable  . Number of children: 0  . Years of education: Not on file  . Highest education level: 12th grade  Occupational History  . Occupation: Comptroller: [employer]  . Financial resource strain: Not on file  . Food insecurity:    Worry: Not on file    Inability: Not on file  . Transportation needs:    Medical: Not on file    Non-medical: Not on file  Tobacco Use  . Smoking status: Never Smoker  . Smokeless tobacco: Never Used  Substance and Sexual Activity  . Alcohol use: Not Currently  . Drug use: No  . Sexual activity: Yes    Partners: Female    Birth control/protection: Surgical    Comment: R-TLH  Lifestyle  . Physical activity:    Days per week: Not on file    Minutes per session: Not on file  . Stress: Not on file  Relationships  . Social connections:    Talks on phone: Not on file    Gets together: Not on file    Attends religious service: Not on file    Active member of club or organization: Not on file    Attends meetings of clubs or organizations: Not on file    Relationship status: Not on file  . Intimate partner violence:    Fear of current or ex partner: Not on file    Emotionally abused: Not on file    Physically abused: Not on  file    Forced sexual activity: Not on file  Other Topics Concern  . Not on file  Social History Narrative   Patient is right-handed. She is married to her same sex partner. She occasionally drinks tea or soda. No  exercise.    Review of Systems  Constitutional: Negative.   HENT: Negative.   Eyes: Negative.   Respiratory: Negative.   Cardiovascular: Negative.   Gastrointestinal: Negative.   Genitourinary:       Vaginal irritation Pain Bleeding  Vaginal swelling  Musculoskeletal: Negative.   Skin: Negative.   Neurological: Negative.   Endo/Heme/Allergies: Negative.   Psychiatric/Behavioral: Negative.     PHYSICAL EXAMINATION:    BP 128/76 (BP Location: Left Arm, Patient Position: Sitting, Cuff Size: Large)   Pulse 76   Temp 98.7 F (37.1 C)   Resp 14   Ht 5' 4.5" (1.638 m)   Wt 131 lb (59.4 kg)   LMP 11/25/2009   BMI 22.14 kg/m     General appearance: alert, cooperative and appears stated age CVA: not tender Abd: soft, not tender, not distended, no masses.    Pelvic: External genitalia:  no lesions, blood noted on her vulva and upper legs              Urethra:  Clot noted at the opening of the urethra, ? Slightly swollen under her urethra, tender with palpation of lower urethra.               Bartholins and Skenes: normal                 Vagina: normal appearing vagina with normal color and discharge, no lesions, no source of bleeding noted.               Cervix: absent              Bimanual Exam:  Uterus:  uterus absent              Adnexa: fullness at the cuff, suspect stool, not tender              Rectovaginal: Yes.  .  Confirms.              Anus:  normal sphincter tone, no lesions  St cath ua: urine was not grossly bloody. Slight amount of bloody liquid expressed from the lower urethra after the cath.   Chaperone was present for exam.  ASSESSMENT Urinary urgency, new urge incontinence, dysuria and hematuria Adnexal fullness, suspect stool     PLAN Send st cath ua for ua, c&s Treat with Bactrim and pyridium Will need further evaluation with continued bleeding F/U in 2 weeks with Dr Quincy Simmonds, needs a repeat pelvic exam. If still with adnexal fullness she will need an ultrasound   An After Visit Summary was printed and given to the patient.  CC: Dr Quincy Simmonds

## 2018-12-11 NOTE — Patient Instructions (Signed)

## 2018-12-11 NOTE — Telephone Encounter (Signed)
Patient started bleeding with some swelling on Friday and bleeding has increased.

## 2018-12-11 NOTE — Telephone Encounter (Signed)
Spoke with patient. Reports red, swollen, painful area on vagina and spotting. Symptoms started on 4/17. Bleeding has increased, still a,light flow, wearing panty liner. Denies urinary symptoms, vaginal odor, fever/chills. Hx of hysterectomy. Covid 19 screening negative. Requesting OV today, patient agreeable to schedule with covering provider. OV scheduled for today at 11:45am with Dr. Talbert Nan.   Last AEX 03/02/18  Routing to provider for final review. Patient is agreeable to disposition. Will close encounter.

## 2018-12-12 LAB — URINALYSIS, MICROSCOPIC ONLY: Casts: NONE SEEN /lpf

## 2018-12-12 LAB — URINE CULTURE: Organism ID, Bacteria: NO GROWTH

## 2018-12-13 ENCOUNTER — Encounter: Payer: Self-pay | Admitting: Obstetrics and Gynecology

## 2018-12-13 ENCOUNTER — Ambulatory Visit (INDEPENDENT_AMBULATORY_CARE_PROVIDER_SITE_OTHER): Payer: BLUE CROSS/BLUE SHIELD | Admitting: Obstetrics and Gynecology

## 2018-12-13 ENCOUNTER — Other Ambulatory Visit: Payer: Self-pay

## 2018-12-13 VITALS — BP 110/70 | HR 72 | Temp 98.1°F | Resp 16 | Wt 134.0 lb

## 2018-12-13 DIAGNOSIS — R319 Hematuria, unspecified: Secondary | ICD-10-CM

## 2018-12-13 DIAGNOSIS — N368 Other specified disorders of urethra: Secondary | ICD-10-CM | POA: Diagnosis not present

## 2018-12-13 LAB — POCT URINALYSIS DIPSTICK
Bilirubin, UA: NEGATIVE
Glucose, UA: NEGATIVE
Ketones, UA: NEGATIVE
Leukocytes, UA: NEGATIVE
Nitrite, UA: NEGATIVE
Protein, UA: NEGATIVE
Urobilinogen, UA: NEGATIVE E.U./dL — AB
pH, UA: 5 (ref 5.0–8.0)

## 2018-12-13 NOTE — Progress Notes (Signed)
Patient scheduled while in office at [facility] on 12/15/18 at 0900renn. OV note faxed to [phone]. Patient verbalize understanding and is agreeable.

## 2018-12-13 NOTE — Progress Notes (Signed)
GYNECOLOGY  VISIT   HPI: 53 y.o.   Married Other or two or more races Not Hispanic or Latino  female   [id number] with Patient's last menstrual period was 11/25/2009.   here for vaginal bleeding/blood in urine. The patient was seen 2 days ago with "vaginal bleeding" and some urinary c/o. She has a h/o a hysterectomy, she had blood on her perineum at the time of her visit, a clot at the opening of her urethra, no bleeding source from her vagina. A straight cath urine was + for microscopic blood, no visible lesions at the urethra.  Her urine culture was negative and she is here for f/u. The bleeding is less, not just when she voids. She is wearing a mini-pad. Doesn't think she is leaking urine. The pain is better, but still tender near her urethra. Slight discomfort in her lower abdomen. Still with urinary urgency.  She was noted to have some fullness of her vaginal cuff earlier this week, felt to likely be stool.  No flank pain or fever.   GYNECOLOGIC HISTORY: Patient's last menstrual period was 11/25/2009. Contraception:hysterectomy Menopausal hormone therapy: none        OB History    Gravida  0   Para  0   Term  0   Preterm  0   AB  0   Living  0     SAB  0   TAB  0   Ectopic  0   Multiple  0   Live Births                 Patient Active Problem List   Diagnosis Date Noted  . Scapular dyskinesis 04/19/2018  . Nonallopathic lesion of cervical region 04/19/2018  . Nonallopathic lesion of rib cage 04/19/2018  . Nonallopathic lesion of thoracic region 04/19/2018  . Left breast mass 03/04/2018  . Bursitis of right shoulder 02/28/2018  . Pain in right foot 01/25/2018  . Chronic right shoulder pain 01/25/2018  . Acute appendicitis 10/31/2015  . Hypertension   . Migraine headache with aura   . OCD (obsessive compulsive disorder)   . Hemorrhoids 08/30/2012    Past Medical History:  Diagnosis Date  . Anemia   . Fibroid   . Gastric ulcer   . Hemorrhoids   .  Hyperlipidemia   . Hypertension   . Menorrhagia   . Migraine headache with aura 2012  . OCD (obsessive compulsive disorder)   . PONV (postoperative nausea and vomiting)     Past Surgical History:  Procedure Laterality Date  . APPENDECTOMY    . BREAST BIOPSY Left 07/06/2018   Procedure: LEFT BREAST EXCISIONAL BIOSPY;  Surgeon: [name], MD;  Location: [place];  Service: General;  Laterality: Left;  . DILATATION & CURRETTAGE/HYSTEROSCOPY WITH RESECTOCOPE  12/17/08   of polyps  . DILATION AND CURETTAGE OF UTERUS    . HYSTEROSCOPY  12/17/08   resection polyps  . LAPAROSCOPIC APPENDECTOMY N/A 10/31/2015   Procedure: APPENDECTOMY LAPAROSCOPIC;  Surgeon: Erroll Luna, MD;  Location: [place];  Service: General;  Laterality: N/A;  . ROBOTIC ASSISTED TOTAL HYSTERECTOMY  11/25/09   simple hyperplasia, fibroids  . TONSILLECTOMY      Current Outpatient Medications  Medication Sig Dispense Refill  . amLODipine (NORVASC) 5 MG tablet Take 5 mg by mouth daily.    . Ascorbic Acid (VITAMIN C) 1000 MG tablet Take 1,000 mg by mouth daily.    . Aspirin-Acetaminophen-Caffeine (GOODYS EXTRA STRENGTH PO)  Take by mouth.    Marland Kitchen azelastine (ASTELIN) 0.1 % nasal spray USE 1 SPRAY(S) IN EACH NOSTRIL TWICE DAILY    . Camphor-Menthol (TIGER BALM EXTRA STRENGTH EX) Apply 1 application topically as needed (muscle pain).    . cetirizine (ZYRTEC) 10 MG tablet Take 10 mg by mouth daily.    . Cholecalciferol (VITAMIN D PO) Take 5,000 Int'l Units by mouth daily.    . citalopram (CELEXA) 40 MG tablet Take 40 mg by mouth daily.    . diphenhydrAMINE HCl (BENADRYL ALLERGY PO) Take by mouth.    Eduard Roux (AIMOVIG) 70 MG/ML SOAJ Inject 70 mg into the skin every 30 (thirty) days. 1 pen 5  . ketotifen (ZADITOR) 0.025 % ophthalmic solution Place 1 drop into both eyes as needed.    . Misc Natural Products (OSTEO BI-FLEX JOINT SHIELD PO) Take 1 tablet by mouth daily.     Marland Kitchen omeprazole (PRILOSEC) 40 MG  capsule   3  . pravastatin (PRAVACHOL) 20 MG tablet Take 20 mg by mouth once a week.    . Probiotic Product (PROBIOTIC-10 PO) Take by mouth.    . Propylene Glycol (SYSTANE BALANCE OP) Apply 1 drop to eye as needed (dry eyes).    Marland Kitchen sulfamethoxazole-trimethoprim (BACTRIM DS) 800-160 MG tablet Take 1 tablet by mouth 2 (two) times daily. One PO BID x 3 days 6 tablet 0  . SUMAtriptan (IMITREX) 100 MG tablet Take 1 tablet earliest onset of migraine.  May repeat once in 2 hours if headache persists or recurs. 10 tablet 3  . vitamin B-12 (CYANOCOBALAMIN) 500 MCG tablet Take 500 mcg by mouth daily.    Marland Kitchen zinc gluconate 50 MG tablet Take 50 mg by mouth daily.     No current facility-administered medications for this visit.      ALLERGIES: Erythromycin  Family History  Problem Relation Age of Onset  . Heart disease Father   . Hyperlipidemia Mother   . Dementia Mother   . Heart disease Maternal Grandmother   . Heart disease Maternal Grandfather     Social History   Socioeconomic History  . Marital status: Married    Spouse name: Skip Estimable  . Number of children: 0  . Years of education: Not on file  . Highest education level: 12th grade  Occupational History  . 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 Financial resource strain: Not on file  . Food insecurity:    Worry: Not on file    Inability: Not on file  . Transportation needs:    Medical: Not on file    Non-medical: Not on file  Tobacco Use  . Smoking status: Never Smoker  . Smokeless tobacco: Never Used  Substance and Sexual Activity  . Alcohol use: Not Currently  . Drug use: No  . Sexual activity: Yes    Partners: Female    Birth control/protection: Surgical    Comment: R-TLH  Lifestyle  . Physical activity:    Days per week: Not on file    Minutes per session: Not on file  . Stress: Not on file  Relationships  . Social connections:    Talks on phone: Not on file    Gets  together: Not on file    Attends religious service: Not on file    Active member of club or organization: Not on file    Attends meetings of clubs or organizations: Not on file    Relationship  status: Not on file  . Intimate partner violence:    Fear of current or ex partner: Not on file    Emotionally abused: Not on file    Physically abused: Not on file    Forced sexual activity: Not on file  Other Topics Concern  . Not on file  Social History Narrative   Patient is right-handed. She is married to her same sex partner. She occasionally drinks tea or soda. No exercise.    Review of Systems  Constitutional: Negative.   HENT: Negative.   Eyes: Negative.   Respiratory: Negative.   Cardiovascular: Negative.   Gastrointestinal: Negative.   Genitourinary: Negative.        Bleeding urinary/vaginal  Musculoskeletal: Negative.   Skin:       Stings in vaginal area, irritation, vaginal itching  Neurological: Positive for dizziness.       Lightheaded  Endo/Heme/Allergies: Negative.   Psychiatric/Behavioral: Negative.     PHYSICAL EXAMINATION:    BP 110/70   Pulse 72   Temp 98.1 F (36.7 C) (Oral)   Resp 16   Wt 134 lb (60.8 kg)   LMP 11/25/2009   BMI 22.65 kg/m     General appearance: alert, cooperative and appears stated age Abdomen: soft, non-tender; non distended, no masses,  no organomegaly CVA: not tender  Pelvic: External genitalia:  no lesions              Urethra:  normal appearing urethra with no masses. She is tender with palpation of the urethra and blood is noted with expressing the urethra             Bartholins and Skenes: normal                 Vagina: normal appearing vagina with normal color and discharge, no lesions, carefully inspected, no source of bleeding              Cervix: absent              Bimanual Exam:  Uterus:  uterus absent              Adnexa: no mass, fullness, tenderness              Rectovaginal: Yes.  .  Confirms.              Anus:   normal sphincter tone, no lesions  Chaperone was present for exam.  ASSESSMENT Urethral bleeding, bleeding in between voids, blood expressed from urethra. Urethra tender to palpation.  Negative urine culture  Prior fullness at the vaginal cuff has resolved  PLAN poct urine-rbc 2+ today Referral to Urology, she has a visit there in 2 days   An After Visit Summary was printed and given to the patient.  Over 15 minutes face to face time of which over 50% was spent in counseling.   CC: Dr Quincy Simmonds

## 2018-12-15 ENCOUNTER — Other Ambulatory Visit (HOSPITAL_COMMUNITY): Payer: Self-pay | Admitting: Urology

## 2018-12-15 ENCOUNTER — Other Ambulatory Visit: Payer: Self-pay | Admitting: Urology

## 2018-12-15 ENCOUNTER — Encounter: Payer: Self-pay | Admitting: *Deleted

## 2018-12-15 DIAGNOSIS — D413 Neoplasm of uncertain behavior of urethra: Secondary | ICD-10-CM

## 2018-12-15 DIAGNOSIS — R310 Gross hematuria: Secondary | ICD-10-CM | POA: Diagnosis not present

## 2018-12-15 NOTE — Progress Notes (Signed)
Kristina Dunlap (Key: [id number])  Aimovig 70MG /ML auto-injectors  Form Production manager PA Form  Created  34 minutes ago  Sent to Plan  1 minute ago  Plan Response  1 minute ago  Submit Clinical Questions  less than a minute ago  Determination  Favorable  less than a minute ago  Message from [employer] submitted. PA Case [id number] Status: Approved. Authorization and Notifications Completed.

## 2018-12-19 ENCOUNTER — Other Ambulatory Visit: Payer: Self-pay

## 2018-12-19 ENCOUNTER — Ambulatory Visit (HOSPITAL_COMMUNITY)
Admission: RE | Admit: 2018-12-19 | Discharge: 2018-12-19 | Disposition: A | Discharge status: Home / Self Care | Payer: BLUE CROSS/BLUE SHIELD | Source: Ambulatory Visit | Attending: Urology | Admitting: Urology

## 2018-12-19 DIAGNOSIS — N368 Other specified disorders of urethra: Secondary | ICD-10-CM | POA: Diagnosis not present

## 2018-12-19 DIAGNOSIS — D413 Neoplasm of uncertain behavior of urethra: Secondary | ICD-10-CM

## 2018-12-19 MED ORDER — GADOBUTROL 1 MMOL/ML IV SOLN
6.5000 mL | Freq: Once | INTRAVENOUS | Status: AC | PRN
  Administered 2018-12-19: 16:00:00 6.5 mL via INTRAVENOUS

## 2018-12-20 ENCOUNTER — Encounter (HOSPITAL_COMMUNITY): Payer: Self-pay

## 2018-12-20 NOTE — Patient Instructions (Addendum)
Dulcemaria KEANDRA MEDERO  12/20/2018       Your procedure is scheduled on:  12-26-2018   Report to [facility] Main  Entrance,  Report to admitting at  8:15 AM    Call this number if you have problems the morning of surgery [phone]       Remember: Do not eat food or drink liquids :After Midnight.  This includes no water, candy, gum, or mints.  BRUSH YOUR TEETH MORNING OF SURGERY AND RINSE YOUR MOUTH OUT.       Take these medicines the morning of surgery with A SIP OF WATER:  Citalopram (celexa), Cetirizine (zyrtec),  Amlodipine (norvasc), Omeprazole (prilosec), Imitrex if needed                                   You may not have any metal on your body including hair pins and piercings              Do not wear jewelry, make-up, lotions, powders or perfumes, deodorant              Do not wear nail polish.  Do not shave  48 hours prior to surgery.              Do not bring valuables to the hospital. [place].  Contacts, dentures or bridgework may not be worn into surgery.      Patients discharged the day of surgery WILL NOT be allowed to drive home. YOU MUST HAVE AN ADULT TO DRIVE YOU HOME AND BE WITH YOU FOR 24 HOURS AFTER SURGERY DUE TO ANESTHESIA.  YOU MAY NOT GO HOME BY BUS, TAXI,  OR, UBER  ALONE, YOU MUST HAVE  AN ADULT ACCOMPANYING  YOU HOME AND STAY WITH YOU FOR 24 HOURS.   Name and phone number of your driver: Skip Estimable [phone]               _____________________________________________________________________             [facility] - Preparing for Surgery Before surgery, you can play an important role.  Because skin is not sterile, your skin needs to be as free of germs as possible.  You can reduce the number of germs on your skin by washing with CHG (chlorahexidine gluconate) soap before surgery.  CHG is an antiseptic cleaner which kills germs and bonds with the skin to continue  killing germs even after washing. Please DO NOT use if you have an allergy to CHG or antibacterial soaps.  If your skin becomes reddened/irritated stop using the CHG and inform your nurse when you arrive at Short Stay. Do not shave (including legs and underarms) for at least 48 hours prior to the first CHG shower.  You may shave your face/neck. Please follow these instructions carefully:  1.  Shower with CHG Soap the night before surgery and the  morning of Surgery.  2.  If you choose to wash your hair, wash your hair first as usual with your  normal  shampoo.  3.  After you shampoo, rinse your hair and body thoroughly to remove the  shampoo.  4.  Use CHG as you would any other liquid soap.  You can apply chg directly  to the skin and wash                       Gently with a scrungie or clean washcloth.  5.  Apply the CHG Soap to your body ONLY FROM THE NECK DOWN.   Do not use on face/ open                           Wound or open sores. Avoid contact with eyes, ears mouth and genitals (private parts).                       Wash face,  Genitals (private parts) with your normal soap.             6.  Wash thoroughly, paying special attention to the area where your surgery  will be performed.  7.  Thoroughly rinse your body with warm water from the neck down.  8.  DO NOT shower/wash with your normal soap after using and rinsing off  the CHG Soap.             9.  Pat yourself dry with a clean towel.            10.  Wear clean pajamas.            11.  Place clean sheets on your bed the night of your first shower and do not  sleep with pets. Day of Surgery : Do not apply any lotions/deodorants the morning of surgery.  Please wear clean clothes to the hospital/surgery center.  FAILURE TO FOLLOW THESE INSTRUCTIONS MAY RESULT IN THE CANCELLATION OF YOUR SURGERY PATIENT SIGNATURE_________________________________  NURSE  SIGNATURE__________________________________  ________________________________________________________________________

## 2018-12-21 ENCOUNTER — Other Ambulatory Visit: Payer: Self-pay | Admitting: Urology

## 2018-12-21 NOTE — Progress Notes (Signed)
SPOKE W/  _  Pt via phone     SCREENING SYMPTOMS OF COVID 19:   COUGH-- No  RUNNY NOSE---  No  SORE THROAT--- No  NASAL CONGESTION---- No  SNEEZING---- No  SHORTNESS OF BREATH--- No  DIFFICULTY BREATHING--- No  TEMP >100.0 ----- NO  UNEXPLAINED BODY ACHES------ No  CHILLS -------- NO  HEADACHES --------- NO  LOSS OF SMELL/ TASTE -------- No    HAVE YOU OR ANY FAMILY MEMBER TRAVELLED PAST 14 DAYS OUT OF THE   COUNTY--- NO STATE---- NO COUNTRY---- NO  HAVE YOU OR ANY FAMILY MEMBER BEEN EXPOSED TO ANYONE WITH COVID 19?   Denies.

## 2018-12-21 NOTE — Progress Notes (Signed)
Rcvd faxed approval from [place], approved 12/15/18 - 03/15/19

## 2018-12-22 ENCOUNTER — Encounter (HOSPITAL_COMMUNITY): Payer: Self-pay

## 2018-12-22 ENCOUNTER — Encounter (HOSPITAL_COMMUNITY)
Admission: RE | Admit: 2018-12-22 | Discharge: 2018-12-22 | Disposition: A | Discharge status: Home / Self Care | Payer: BLUE CROSS/BLUE SHIELD | Source: Ambulatory Visit | Attending: Urology | Admitting: Urology

## 2018-12-22 ENCOUNTER — Other Ambulatory Visit: Payer: Self-pay

## 2018-12-22 DIAGNOSIS — Z01818 Encounter for other preprocedural examination: Secondary | ICD-10-CM | POA: Insufficient documentation

## 2018-12-22 HISTORY — DX: Chronic migraine without aura, not intractable, without status migrainosus: G43.709

## 2018-12-22 HISTORY — DX: Major depressive disorder, single episode, unspecified: F32.9

## 2018-12-22 HISTORY — DX: Depression, unspecified: F32.A

## 2018-12-22 HISTORY — DX: Urethral disorder, unspecified: N36.9

## 2018-12-22 HISTORY — DX: Gastro-esophageal reflux disease without esophagitis: K21.9

## 2018-12-22 HISTORY — DX: Reserved for concepts with insufficient information to code with codable children: IMO0002

## 2018-12-22 LAB — BASIC METABOLIC PANEL
Anion gap: 9 (ref 5–15)
BUN: 8 mg/dL (ref 6–20)
CO2: 28 mmol/L (ref 22–32)
Calcium: 9.1 mg/dL (ref 8.9–10.3)
Chloride: 104 mmol/L (ref 98–111)
Creatinine, Ser: 0.6 mg/dL (ref 0.44–1.00)
GFR calc Af Amer: >60 mL/min (ref >60)
GFR calc non Af Amer: >60 mL/min (ref >60)
Glucose, Bld: 132 mg/dL — ABNORMAL HIGH (ref 70–99)
Potassium: 3.8 mmol/L (ref 3.5–5.1)
Sodium: 141 mmol/L (ref 135–145)

## 2018-12-22 LAB — CBC
HCT: 36.1 % (ref 36.0–46.0)
Hemoglobin: 11.9 g/dL — ABNORMAL LOW (ref 12.0–15.0)
MCH: 29.9 pg (ref 26.0–34.0)
MCHC: 33 g/dL (ref 30.0–36.0)
MCV: 90.7 fL (ref 80.0–100.0)
Platelets: 295 10*3/uL (ref 150–400)
RBC: 3.98 MIL/uL (ref 3.87–5.11)
RDW: 12 % (ref 11.5–15.5)
WBC: 5.1 10*3/uL (ref 4.0–10.5)
nRBC: 0 % (ref 0.0–0.2)

## 2018-12-22 NOTE — Progress Notes (Addendum)
Changes on Pre op instructions : Pt states that she takes Celexa at night and that her BP med makes her "woozy" and prefferes to take it at night.

## 2018-12-25 ENCOUNTER — Ambulatory Visit: Payer: BLUE CROSS/BLUE SHIELD | Admitting: Obstetrics and Gynecology

## 2018-12-25 NOTE — H&P (Signed)
CC/HPI: I have blood in my urine.     Kristina Dunlap is sent back today for gross hematuria. She had the onset a week ago of dysuria and she noted the urethral was swollen and red. Over the weekend she had bleeding with a clot but since then the swelling has gone down. She still has some tenderness and burning after voiding. She had some frequency and urgency. She has had a couple of accidents. She has had no flank pain. She saw her Gyn and vaginal bleeding was ruled out and she was given an antibiotic but the culture was negative. She was recent on Weds and was told the bleeding was urethral. She had cystoscopy and UDS in 10/18. She had a CT AP in 3/17 that showed normal upper tracts.     ALLERGIES: cyclobenzaprine Erythromycin - Other Reaction, upset stomach    MEDICATIONS: Omeprazole 40 mg capsule,delayed release  Aimovig Autoinjector  Amlodipine Besylate 5 mg tablet  Benadryl 25 mg capsule PRN  Citalopram Hbr 40 mg tablet  Ester-C  Goody's Extra Strength PRN  Melatonin 10 mg tablet tablet PRN  Osteo Bi-Flex  Pravastatin Sodium 20 mg tablet 1 tablet PO Q1WK  Probiotic Formula  Sumatriptan Succinate 100 mg tablet tablet PRN  Vitamin B12  Vitamin D3  Zinc 50 mg tablet     GU PSH: Complex cystometrogram, w/ void pressure and urethral pressure profile studies, any technique - 06/06/2017 Complex Uroflow - 06/08/2017, 06/06/2017 Cystoscopy - 06/08/2017 Emg surf Electrd - 06/06/2017 Hysterectomy Inject For cystogram - 06/06/2017 Intrabd voidng Press - 06/06/2017    NON-GU PSH: Appendectomy (laparoscopic) Breast Biopsy, Left D&&C Tonsillectomy    GU PMH: Bladder, Neuromuscular dysfunction, Unspec - 06/08/2017 Weak Urinary Stream - 06/08/2017 Urinary Frequency, She has daytime frequency without pain or nocturia. I discussed the options for evaluation and treatment and will have her try to avoid dietary irritants, consume primarily water and I have given her samples of Myrbetriq 25mg   and 50mg  and reviewed the side effects and instructions. her BP is borderline today but she states it is usually normal and will monitor it closely on the Myrbetriq. She will f/u in 1 month. If she doesn't improve, she will need urodynamics and possible cystoscopy. - 2018    NON-GU PMH: Constipation, unspecified - 09/23/2017, - 09/22/2017, - 08/19/2017, - 07/28/2017, - 07/11/2017 Muscle weakness (generalized) - 09/23/2017, - 09/22/2017, - 08/19/2017, - 07/28/2017, - 07/11/2017, - 06/22/2017 Other muscle spasm - 09/23/2017, - 09/22/2017, - 08/19/2017, - 07/28/2017, - 07/11/2017 Other lack of coordination - 06/22/2017 Acute gastric ulcer with hemorrhage Anxiety Cardiac murmur, unspecified Depression Hypercholesterolemia Hypertension Obsessive-compulsive disorder, unspecified    FAMILY HISTORY: Dementia - Mother Heart Disease - Runs in Family hyperlipidemia - Mother   SOCIAL HISTORY: Marital Status: Married Preferred Language: English; Race: White Current Smoking Status: Patient has never smoked.   Tobacco Use Assessment Completed: Used Tobacco in last 30 days? Light Drinker.  Drinks 3 caffeinated drinks per day. 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    REVIEW OF SYSTEMS:    GU Review Female:   urethral bleeding . Patient reports hard to postpone urination, burning /pain with urination, and leakage of urine. Patient denies frequent urination, get up at night to urinate, stream starts and stops, trouble starting your stream, have to strain to urinate, and being pregnant.  Gastrointestinal (Upper):   Patient denies nausea, vomiting, and indigestion/ heartburn.  Gastrointestinal (Lower):   Patient denies diarrhea and constipation.  Constitutional:   Patient denies fever, night  sweats, weight loss, and fatigue.  Skin:   Patient denies skin rash/ lesion and itching.  Eyes:   Patient denies blurred vision and double vision.  Ears/ Nose/ Throat:   Patient denies sore throat and sinus problems.   Hematologic/Lymphatic:   Patient denies swollen glands and easy bruising.  Cardiovascular:   Patient denies leg swelling and chest pains.  Respiratory:   Patient denies cough and shortness of breath.  Endocrine:   Patient denies excessive thirst.  Musculoskeletal:   Patient denies back pain and joint pain.  Neurological:   Patient denies headaches and dizziness.  Psychologic:   Patient denies depression and anxiety.   VITAL SIGNS:      12/15/2018 09:11 AM  Weight 134 lb / 60.78 kg  Height 65 in / 165.1 cm  BP 151/96 mmHg  Pulse 95 /min  Temperature 98.0 F / 36.6 C  BMI 22.3 kg/m   GU PHYSICAL EXAMINATION:    External Genitalia: No hirsutism, no rash, no scarring, no cyst, no erythematous lesion, no papular lesion, no blanched lesion, no warty lesion. No edema.  Urethral Meatus: Normal size. Normal position. bloody discharge.   Urethra: No tenderness, no mass, no scarring. No urethral hypermobility. No leakage. some fullness  Bladder: Normal to palpation, no tenderness, no mass, normal size.  Vagina: Mild vaginal atrophy. No stenosis. No rectocele. No cystocele. No enterocele.    MULTI-SYSTEM PHYSICAL EXAMINATION:    Constitutional: Well-nourished. No physical deformities. Normally developed. Good grooming.  Respiratory: . No labored breathing, no use of accessory muscles.   Gastrointestinal: No mass, no tenderness, no rigidity, non obese abdomen.      PAST DATA REVIEWED:  Source Of History:  Patient  Urine Test Review:   Urinalysis   PROCEDURES:         Flexible Cystoscopy - 52000  Risks, benefits, and some of the potential complications of the procedure were discussed. She was prepped with betadine.   Meatus:  Normal size. Normal location. Normal condition.  Urethra:  No hypermobility. No leakage. there is a hemorrhagic lesion arising posteriorly in the mid urethra  Ureteral Orifices:  Normal location. Normal size. Normal shape. Effluxed clear urine.  Bladder:  Mild  trabeculation. No tumors. Normal mucosa. No stones.      The procedure was well tolerated and there were no complications.         Urinalysis w/Scope Dipstick Dipstick Cont'd Micro  Color: Yellow Bilirubin: Neg mg/dL WBC/hpf: NS (Not Seen)  Appearance: Clear Ketones: Neg mg/dL RBC/hpf: 3 - 10/hpf  Specific Gravity: <=1.005 Blood: 3+ ery/uL Bacteria: NS (Not Seen)  pH: <=5.0 Protein: Neg mg/dL Cystals: NS (Not Seen)  Glucose: Neg mg/dL Urobilinogen: 0.2 mg/dL Casts: NS (Not Seen)    Nitrites: Neg Trichomonas: Not Present    Leukocyte Esterase: Neg leu/uL Mucous: Not Present      Epithelial Cells: NS (Not Seen)      Yeast: NS (Not Seen)      Sperm: Not Present    ASSESSMENT:      ICD-10 Details  1 GU:   Gross hematuria - R31.0 Kristina Dunlap has a posterior mid urethral hemorrhagic lesion of unspecified nature. She may have a diverticulum as well. I am going to get a pelvic CT to assess the anatomy and then will schedule her for outpatient cystoscopy with biopsy of the lesion. I reviewed the risks of bleeding, infection, urethral injury with stricture or incontinence, thrombotic events and anesthetic complications.   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 Contrast - She has a urethral lesion and possible urethral diverticulum that I need to assess prior to a biopsy. Please schedule ASAP.  Return Visit/Planned Activity: ASAP - Schedule Surgery    The MRI shows a 1.7 cm lesion at the urethral meatus that could be a skein's gland with infection, a urethral diverticulum or possibly a neoplasm.   I will consider a biopsy of this at surgery.   Pt informed and risks reviewed.

## 2018-12-25 NOTE — Progress Notes (Signed)
SPOKE W/  _     SCREENING SYMPTOMS OF COVID 19:   COUGH--no  RUNNY NOSE---no   SORE THROAT---no  NASAL CONGESTION----no  SNEEZING---no  SHORTNESS OF BREATH---no  DIFFICULTY BREATHING---no  TEMP >100.0 -----no  UNEXPLAINED BODY ACHES------no  CHILLS --------no   HEADACHES ---------no  LOSS OF SMELL/ TASTE --------no    HAVE YOU OR ANY FAMILY MEMBER TRAVELLED PAST 14 DAYS OUT OF THE   COUNTY---no STATE----no COUNTRY----no  HAVE YOU OR ANY FAMILY MEMBER BEEN EXPOSED TO ANYONE WITH COVID 19?   no

## 2018-12-26 ENCOUNTER — Encounter (HOSPITAL_COMMUNITY)
Admission: RE | Disposition: A | Discharge status: Home / Self Care | Payer: Self-pay | Source: Home / Self Care | Attending: Urology

## 2018-12-26 ENCOUNTER — Ambulatory Visit (HOSPITAL_COMMUNITY): Payer: BLUE CROSS/BLUE SHIELD | Admitting: Certified Registered"

## 2018-12-26 ENCOUNTER — Ambulatory Visit (HOSPITAL_COMMUNITY)
Admission: RE | Admit: 2018-12-26 | Discharge: 2018-12-26 | Disposition: A | Discharge status: Home / Self Care | Payer: BLUE CROSS/BLUE SHIELD | Attending: Urology | Admitting: Urology

## 2018-12-26 ENCOUNTER — Encounter (HOSPITAL_COMMUNITY): Payer: Self-pay | Admitting: *Deleted

## 2018-12-26 ENCOUNTER — Ambulatory Visit (HOSPITAL_COMMUNITY): Payer: BLUE CROSS/BLUE SHIELD | Admitting: Physician Assistant

## 2018-12-26 DIAGNOSIS — N368 Other specified disorders of urethra: Secondary | ICD-10-CM | POA: Diagnosis not present

## 2018-12-26 DIAGNOSIS — N369 Urethral disorder, unspecified: Secondary | ICD-10-CM | POA: Diagnosis not present

## 2018-12-26 DIAGNOSIS — Z8711 Personal history of peptic ulcer disease: Secondary | ICD-10-CM | POA: Insufficient documentation

## 2018-12-26 DIAGNOSIS — E7800 Pure hypercholesterolemia, unspecified: Secondary | ICD-10-CM | POA: Diagnosis not present

## 2018-12-26 DIAGNOSIS — Z888 Allergy status to other drugs, medicaments and biological substances status: Secondary | ICD-10-CM | POA: Insufficient documentation

## 2018-12-26 DIAGNOSIS — I10 Essential (primary) hypertension: Secondary | ICD-10-CM | POA: Diagnosis not present

## 2018-12-26 DIAGNOSIS — Z90710 Acquired absence of both cervix and uterus: Secondary | ICD-10-CM | POA: Diagnosis not present

## 2018-12-26 DIAGNOSIS — F429 Obsessive-compulsive disorder, unspecified: Secondary | ICD-10-CM | POA: Insufficient documentation

## 2018-12-26 DIAGNOSIS — Z881 Allergy status to other antibiotic agents status: Secondary | ICD-10-CM | POA: Diagnosis not present

## 2018-12-26 DIAGNOSIS — F419 Anxiety disorder, unspecified: Secondary | ICD-10-CM | POA: Diagnosis not present

## 2018-12-26 DIAGNOSIS — N3081 Other cystitis with hematuria: Secondary | ICD-10-CM | POA: Diagnosis not present

## 2018-12-26 DIAGNOSIS — R011 Cardiac murmur, unspecified: Secondary | ICD-10-CM | POA: Diagnosis not present

## 2018-12-26 DIAGNOSIS — Z8249 Family history of ischemic heart disease and other diseases of the circulatory system: Secondary | ICD-10-CM | POA: Diagnosis not present

## 2018-12-26 DIAGNOSIS — K649 Unspecified hemorrhoids: Secondary | ICD-10-CM | POA: Diagnosis not present

## 2018-12-26 DIAGNOSIS — Z79899 Other long term (current) drug therapy: Secondary | ICD-10-CM | POA: Insufficient documentation

## 2018-12-26 DIAGNOSIS — N340 Urethral abscess: Secondary | ICD-10-CM | POA: Diagnosis not present

## 2018-12-26 DIAGNOSIS — D4959 Neoplasm of unspecified behavior of other genitourinary organ: Secondary | ICD-10-CM | POA: Diagnosis not present

## 2018-12-26 DIAGNOSIS — K3580 Unspecified acute appendicitis: Secondary | ICD-10-CM | POA: Diagnosis not present

## 2018-12-26 HISTORY — PX: CYSTOSCOPY WITH BIOPSY: SHX5122

## 2018-12-26 SURGERY — CYSTOSCOPY, WITH BIOPSY
Anesthesia: General

## 2018-12-26 MED ORDER — MIDAZOLAM HCL 2 MG/2ML IJ SOLN
INTRAMUSCULAR | Status: AC
  Filled 2018-12-26: qty 2

## 2018-12-26 MED ORDER — LACTATED RINGERS IV SOLN
INTRAVENOUS | Status: DC
  Administered 2018-12-26: 09:00:00 via INTRAVENOUS

## 2018-12-26 MED ORDER — MIDAZOLAM HCL 2 MG/2ML IJ SOLN
INTRAMUSCULAR | Status: DC | PRN
  Administered 2018-12-26: 2 mg via INTRAVENOUS

## 2018-12-26 MED ORDER — MIDAZOLAM HCL 2 MG/2ML IJ SOLN: 0.5000 mg | Freq: Once | INTRAMUSCULAR | Status: DC | PRN

## 2018-12-26 MED ORDER — ACETAMINOPHEN 325 MG PO TABS: 650.0000 mg | ORAL_TABLET | ORAL | Status: DC | PRN

## 2018-12-26 MED ORDER — LIDOCAINE 2% (20 MG/ML) 5 ML SYRINGE
INTRAMUSCULAR | Status: AC
  Filled 2018-12-26: qty 5

## 2018-12-26 MED ORDER — ONDANSETRON HCL 4 MG/2ML IJ SOLN
INTRAMUSCULAR | Status: DC | PRN
  Administered 2018-12-26: 4 mg via INTRAVENOUS

## 2018-12-26 MED ORDER — MORPHINE SULFATE (PF) 4 MG/ML IV SOLN: 2.0000 mg | INTRAVENOUS | Status: DC | PRN

## 2018-12-26 MED ORDER — SODIUM CHLORIDE 0.9% FLUSH: 3.0000 mL | INTRAVENOUS | Status: DC | PRN

## 2018-12-26 MED ORDER — SUCCINYLCHOLINE CHLORIDE 200 MG/10ML IV SOSY
PREFILLED_SYRINGE | INTRAVENOUS | Status: DC | PRN
  Administered 2018-12-26: 120 mg via INTRAVENOUS

## 2018-12-26 MED ORDER — PROPOFOL 10 MG/ML IV BOLUS
INTRAVENOUS | Status: AC
  Filled 2018-12-26: qty 20

## 2018-12-26 MED ORDER — HYDROCODONE-ACETAMINOPHEN 5-325 MG PO TABS: 1.0000 | ORAL_TABLET | Freq: Four times a day (QID) | ORAL | 0 refills | Status: DC | PRN

## 2018-12-26 MED ORDER — LIDOCAINE-EPINEPHRINE 0.5 %-1:200000 IJ SOLN
INTRAMUSCULAR | Status: AC
  Filled 2018-12-26: qty 1

## 2018-12-26 MED ORDER — CEFAZOLIN SODIUM-DEXTROSE 2-4 GM/100ML-% IV SOLN
2.0000 g | INTRAVENOUS | Status: AC
  Administered 2018-12-26: 2 g via INTRAVENOUS
  Filled 2018-12-26: qty 100

## 2018-12-26 MED ORDER — LIDOCAINE-EPINEPHRINE 0.5 %-1:200000 IJ SOLN
INTRAMUSCULAR | Status: DC | PRN
  Administered 2018-12-26: 2 mL

## 2018-12-26 MED ORDER — DEXAMETHASONE SODIUM PHOSPHATE 10 MG/ML IJ SOLN
INTRAMUSCULAR | Status: AC
  Filled 2018-12-26: qty 1

## 2018-12-26 MED ORDER — SODIUM CHLORIDE (PF) 0.9 % IJ SOLN
INTRAMUSCULAR | Status: AC
  Filled 2018-12-26: qty 10

## 2018-12-26 MED ORDER — FENTANYL CITRATE (PF) 100 MCG/2ML IJ SOLN
INTRAMUSCULAR | Status: AC
  Filled 2018-12-26: qty 2

## 2018-12-26 MED ORDER — SODIUM CHLORIDE 0.9 % IV SOLN: 250.0000 mL | INTRAVENOUS | Status: DC | PRN

## 2018-12-26 MED ORDER — ACETAMINOPHEN 650 MG RE SUPP: 650.0000 mg | RECTAL | Status: DC | PRN

## 2018-12-26 MED ORDER — OXYCODONE HCL 5 MG PO TABS: 5.0000 mg | ORAL_TABLET | ORAL | Status: DC | PRN

## 2018-12-26 MED ORDER — FENTANYL CITRATE (PF) 250 MCG/5ML IJ SOLN
INTRAMUSCULAR | Status: DC | PRN
  Administered 2018-12-26 (×2): 50 ug via INTRAVENOUS

## 2018-12-26 MED ORDER — PROMETHAZINE HCL 25 MG/ML IJ SOLN: 6.2500 mg | INTRAMUSCULAR | Status: DC | PRN

## 2018-12-26 MED ORDER — STERILE WATER FOR IRRIGATION IR SOLN
Status: DC | PRN
  Administered 2018-12-26: 3000 mL

## 2018-12-26 MED ORDER — SCOPOLAMINE 1 MG/3DAYS TD PT72
1.0000 | MEDICATED_PATCH | Freq: Once | TRANSDERMAL | Status: DC
  Administered 2018-12-26: 09:00:00 1.5 mg via TRANSDERMAL
  Filled 2018-12-26: qty 1

## 2018-12-26 MED ORDER — SODIUM CHLORIDE 0.9% FLUSH: 3.0000 mL | Freq: Two times a day (BID) | INTRAVENOUS | Status: DC

## 2018-12-26 MED ORDER — SUGAMMADEX SODIUM 200 MG/2ML IV SOLN
INTRAVENOUS | Status: DC | PRN
  Administered 2018-12-26: 150 mg via INTRAVENOUS

## 2018-12-26 MED ORDER — CEPHALEXIN 500 MG PO CAPS: 500.0000 mg | ORAL_CAPSULE | Freq: Three times a day (TID) | ORAL | 0 refills | Status: AC

## 2018-12-26 MED ORDER — SUCCINYLCHOLINE CHLORIDE 200 MG/10ML IV SOSY
PREFILLED_SYRINGE | INTRAVENOUS | Status: AC
  Filled 2018-12-26: qty 10

## 2018-12-26 MED ORDER — PROPOFOL 10 MG/ML IV BOLUS
INTRAVENOUS | Status: DC | PRN
  Administered 2018-12-26: 120 mg via INTRAVENOUS

## 2018-12-26 MED ORDER — DEXAMETHASONE SODIUM PHOSPHATE 10 MG/ML IJ SOLN
INTRAMUSCULAR | Status: DC | PRN
  Administered 2018-12-26: 5 mg via INTRAVENOUS

## 2018-12-26 MED ORDER — HYDROMORPHONE HCL 1 MG/ML IJ SOLN
0.2500 mg | INTRAMUSCULAR | Status: DC | PRN
  Administered 2018-12-26: 0.5 mg via INTRAVENOUS

## 2018-12-26 MED ORDER — SUGAMMADEX SODIUM 200 MG/2ML IV SOLN
INTRAVENOUS | Status: AC
  Filled 2018-12-26: qty 2

## 2018-12-26 MED ORDER — MEPERIDINE HCL 50 MG/ML IJ SOLN: 6.2500 mg | INTRAMUSCULAR | Status: DC | PRN

## 2018-12-26 MED ORDER — ROCURONIUM BROMIDE 10 MG/ML (PF) SYRINGE
PREFILLED_SYRINGE | INTRAVENOUS | Status: DC | PRN
  Administered 2018-12-26: 30 mg via INTRAVENOUS

## 2018-12-26 MED ORDER — LIDOCAINE 2% (20 MG/ML) 5 ML SYRINGE
INTRAMUSCULAR | Status: DC | PRN
  Administered 2018-12-26: 60 mg via INTRAVENOUS

## 2018-12-26 MED ORDER — HYDROMORPHONE HCL 1 MG/ML IJ SOLN
INTRAMUSCULAR | Status: AC
  Filled 2018-12-26: qty 1

## 2018-12-26 MED ORDER — ROCURONIUM BROMIDE 10 MG/ML (PF) SYRINGE
PREFILLED_SYRINGE | INTRAVENOUS | Status: AC
  Filled 2018-12-26: qty 10

## 2018-12-26 MED ORDER — ONDANSETRON HCL 4 MG/2ML IJ SOLN
INTRAMUSCULAR | Status: AC
  Filled 2018-12-26: qty 2

## 2018-12-26 SURGICAL SUPPLY — 28 items
BAG URINE DRAINAGE (UROLOGICAL SUPPLIES) ×1 IMPLANT
BAG URO CATCHER STRL LF (MISCELLANEOUS) ×2 IMPLANT
BLADE SURG 15 STRL LF DISP TIS (BLADE) IMPLANT
BLADE SURG 15 STRL SS (BLADE) ×2
CATH FOLEY 2WAY SLVR  5CC 16FR (CATHETERS) ×1
CATH FOLEY 2WAY SLVR 5CC 16FR (CATHETERS) ×1 IMPLANT
CATH FOLEY 3WAY 30CC 22FR (CATHETERS) IMPLANT
CATH URET 5FR 28IN OPEN ENDED (CATHETERS) IMPLANT
COVER WAND RF STERILE (DRAPES) IMPLANT
GAUZE 4X4 16PLY RFD (DISPOSABLE) ×1 IMPLANT
GLOVE SURG SS PI 8.0 STRL IVOR (GLOVE) IMPLANT
GOWN STRL REUS W/TWL XL LVL3 (GOWN DISPOSABLE) ×2 IMPLANT
HOLDER FOLEY CATH W/STRAP (MISCELLANEOUS) IMPLANT
KIT TURNOVER KIT A (KITS) IMPLANT
LOOP CUT BIPOLAR 24F LRG (ELECTROSURGICAL) IMPLANT
MANIFOLD NEPTUNE II (INSTRUMENTS) ×2 IMPLANT
NEEDLE HYPO 22GX1.5 SAFETY (NEEDLE) ×2 IMPLANT
PACK CYSTO (CUSTOM PROCEDURE TRAY) ×2 IMPLANT
SET ASPIRATION TUBING (TUBING) ×2 IMPLANT
SET CYSTO W/LG BORE CLAMP LF (SET/KITS/TRAYS/PACK) ×2 IMPLANT
SUT ETHILON 3 0 PS 1 (SUTURE) IMPLANT
SUT VIC AB 3-0 SH 27 (SUTURE) ×4
SUT VIC AB 3-0 SH 27X BRD (SUTURE) IMPLANT
SYR 10ML LL (SYRINGE) ×2 IMPLANT
SYR 30ML LL (SYRINGE) IMPLANT
SYRINGE IRR TOOMEY STRL 70CC (SYRINGE) IMPLANT
TUBING CONNECTING 10 (TUBING) ×2 IMPLANT
TUBING UROLOGY SET (TUBING) ×2 IMPLANT

## 2018-12-26 NOTE — Anesthesia Preprocedure Evaluation (Addendum)
Anesthesia Evaluation  Patient identified by MRN, date of birth, ID band Patient awake    Reviewed: Allergy & Precautions, NPO status , Patient's Chart, lab work & pertinent test results  History of Anesthesia Complications (+) PONV  Airway Mallampati: III  TM Distance: >3 FB Neck ROM: Full    Dental  (+) Dental Advisory Given   Pulmonary neg pulmonary ROS,    breath sounds clear to auscultation       Cardiovascular hypertension, Pt. on medications (-) angina Rhythm:Regular Rate:Normal     Neuro/Psych  Headaches, Anxiety Depression    GI/Hepatic negative GI ROS, Neg liver ROS,   Endo/Other  negative endocrine ROS  Renal/GU negative Renal ROS     Musculoskeletal   Abdominal   Peds  Hematology negative hematology ROS (+)   Anesthesia Other Findings   Reproductive/Obstetrics negative OB ROS                            Anesthesia Physical Anesthesia Plan  ASA: II  Anesthesia Plan: General   Post-op Pain Management:    Induction: Intravenous  PONV Risk Score and Plan: 4 or greater and Scopolamine patch - Pre-op, Dexamethasone and Ondansetron  Airway Management Planned: Oral ETT  Additional Equipment:   Intra-op Plan:   Post-operative Plan: Extubation in OR  Informed Consent: I have reviewed the patients History and Physical, chart, labs and discussed the procedure including the risks, benefits and alternatives for the proposed anesthesia with the patient or authorized representative who has indicated his/her understanding and acceptance.     Dental advisory given  Plan Discussed with: CRNA and Surgeon  Anesthesia Plan Comments:        Anesthesia Quick Evaluation

## 2018-12-26 NOTE — Interval H&P Note (Signed)
History and Physical Interval Note:  12/26/2018 9:34 AM  Kristina Dunlap Kristina Dunlap  has presented today for surgery, with the diagnosis of URETHRAL LESION.  The various methods of treatment have been discussed with the patient and family. After consideration of risks, benefits and other options for treatment, the patient has consented to  Procedure(s): CYSTOSCOPY WITH URETHRAL AND TRANSVAGINAL BIOPSY (N/A) as a surgical intervention.  The patient's history has been reviewed, patient examined, no change in status, stable for surgery.  I have reviewed the patient's chart and labs.  Questions were answered to the patient's satisfaction.     Irine Seal

## 2018-12-26 NOTE — Op Note (Signed)
Procedure: 1.  Cystoscopy with urethral biopsy. 2.  Excision of sub-meatal lesion.  Preop diagnosis: Hematuria with posterior urethral lesion and possible Skains gland versus diverticulum versus neoplasm in the sub-meatal area.  Postop diagnosis: Same.  Surgeon: Dr. Irine Seal.  Anesthesia: General.  Specimen: Biopsies from urethra and excisional biopsy of sub-meatal lesion.  Drain: 69 [place] Foley catheter.  EBL: Minimal.  Complications: None.  Indications: Kristina Dunlap is a 53 year old female who was referred for urethral bleeding.  Office cystoscopy demonstrated a lesion in the distal posterior urethra that was hemorrhagic.  An MRI of the pelvis was obtained which demonstrated a 1.7 cm lesion in the sub-meatal area with a differential diagnosis of an infected Skene's gland, urethral diverticulum and possible neoplasm.  It was felt that further evaluation with biopsy was indicated.  Procedure: She was taken operating room where general anesthetic was induced.  She was given 2 g of Ancef.  She was placed in lithotomy position and fitted with PAS hose.  Her perineum and genitalia were prepped with Betadine solution and she was draped in usual sterile fashion.  Cystoscopy was performed with a 23 [place] scope and 30 degree lens.  Examination revealed a persistent hemorrhagic lesion in the distal posterior urethra.  The proximal urethra and bladder neck were unremarkable.  There was a small clot in the bladder.  The urine was cloudy but after irrigation the bladder mucosa was found to be smooth and pale with only mild trabeculation.  No lesions were identified.  Ureteral orifices were unremarkable.  A rigid cup biopsy forceps was then used to biopsy the urethral lesion.  2 specimens were obtained and there appeared to be most consistent with with hemorrhagic tissue.  Obvious tumor fronds were not identified.  There was not an obvious bulge in the sub-meatal area however there were subtle changes and  I felt that further exploration with removal of the MRI noted lesion was indicated.  The anterior vaginal wall just below the meatus was infiltrated with approximately 2 mL of 0.5% lidocaine with epinephrine and a midline incision was made with a 15 blade over the area of concern.  Metzenbaum scissors were used to dissect out a small cystic structure in the sub-meatal area.  This was excised and handed off for pathologic analysis.  Obvious tumor fronds were not identified.  The structure was in continuity with the urethra so a small urethral defect was noted.  The urethral defect was closed with a running 3-0 Vicryl suture.  The vaginal wall was then closed with a running locked 3-0 Vicryl suture.  A 16 French Foley catheter been placed to aid closure.  The catheter was placed the leg bag drainage.  She was taken down from lithotomy position, her anesthetic was reversed and she was moved to recovery room in stable condition.  There were no complications.  Counts were correct.

## 2018-12-26 NOTE — Anesthesia Postprocedure Evaluation (Signed)
Anesthesia Post Note  Patient: Kristina Dunlap  Procedure(s) Performed: CYSTOSCOPY WITH URETHRAL AND TRANSVAGINAL BIOPSY (N/A )     Patient location during evaluation: PACU Anesthesia Type: General Level of consciousness: awake and alert, patient cooperative and oriented Pain management: pain level controlled Vital Signs Assessment: post-procedure vital signs reviewed and stable Respiratory status: spontaneous breathing, nonlabored ventilation and respiratory function stable Cardiovascular status: blood pressure returned to baseline and stable Postop Assessment: no apparent nausea or vomiting Anesthetic complications: no    Last Vitals:  Vitals:   12/26/18 1215 12/26/18 1300  BP: (!) 149/91 (!) 145/88  Pulse: 81 81  Resp: 15 15  Temp:  36.7 C  SpO2: 97% 97%    Last Pain:  Vitals:   12/26/18 1145  TempSrc:   PainSc: 2                  Kaylin Marcon,E. Rydge [place]

## 2018-12-26 NOTE — Discharge Instructions (Addendum)
Indwelling Urinary Catheter Insertion, Care After This sheet gives you information about how to care for yourself after your procedure. Your health care provider may also give you more specific instructions. If you have problems or questions, contact your health care provider. What can I expect after the procedure? After the procedure, it is common to have:  Slight discomfort around your urethra where the catheter enters your body. Follow these instructions at home:   Keep the drainage bag at or below the level of your bladder. Doing this ensures that urine can only drain out, not back into your body.  Secure the catheter tubing and drainage bag to your leg or thigh to keep it from moving.  Check the catheter tubing regularly to make sure there are no kinks or blockages.  Take showers daily to keep the catheter clean. Do not take a bath.  Do not pull on your catheter or try to remove it.  Disconnect the tubing and drainage bag as little as possible.  Empty the drainage bag every 2-4 hours, or more often if needed. Do not let the bag get completely full.  Wash your hands with soap and water before and after touching the catheter, tubing, or drainage bag.  Do not let the drainage bag or catheter tubing touch the floor.  Drink enough fluids to keep your urine clear or pale yellow, or as told by your health care provider. Contact a health care provider if:  Urine stops flowing into the drainage bag.  You feel pain or pressure in the bladder area.  You have back pain.  Your catheter gets clogged.  Your catheter starts to leak.  Your urine looks cloudy.  Your drainage bag or tubing looks dirty.  You notice a bad smell when emptying your drainage bag. Get help right away if:  You have a fever or chills.  You have severe pain in your back or your lower abdomen.  You have warmth, redness, swelling, or pain in the urethra area.  You notice blood in your urine.  Your  catheter gets pulled out. Summary  Do not pull on your catheter or try to remove it.  Keep the drainage bag at or below the level of your bladder, but do not let the drainage bag or catheter tubing touch the floor.  Wash your hands with soap and water before and after touching the catheter, tubing, or drainage bag.  Contact your health care provider if you have a fever, chills, or any other signs of infection.    You may remove the catheter on Friday morning and if you don't feel you can do that, call the office at [phone] to come and have it removed.      This information is not intended to replace advice given to you by your health care provider. Make sure you discuss any questions you have with your health care provider. Document Released: 09/18/2016 Document Revised: 09/18/2016 Document Reviewed: 09/18/2016 Elsevier Interactive Patient Education  2019 [employer].

## 2018-12-26 NOTE — Anesthesia Procedure Notes (Signed)
Procedure Name: Intubation Date/Time: 12/26/2018 10:33 AM Performed by: Eben Burow, CRNA Pre-anesthesia Checklist: Patient identified, Emergency Drugs available, Suction available, Patient being monitored and Timeout performed Patient Re-evaluated:Patient Re-evaluated prior to induction Oxygen Delivery Method: Circle system utilized Preoxygenation: Pre-oxygenation with 100% oxygen Induction Type: IV induction and Rapid sequence Laryngoscope Size: Mac and 4 Grade View: Grade II Tube type: Oral Tube size: 7.0 mm Number of attempts: 1 Airway Equipment and Method: Stylet Placement Confirmation: ETT inserted through vocal cords under direct vision,  positive ETCO2 and breath sounds checked- equal and bilateral Secured at: 21 cm Tube secured with: Tape Dental Injury: Teeth and Oropharynx as per pre-operative assessment

## 2018-12-26 NOTE — Transfer of Care (Signed)
Immediate Anesthesia Transfer of Care Note  Patient: Kristina Dunlap  Procedure(s) Performed: CYSTOSCOPY WITH URETHRAL AND TRANSVAGINAL BIOPSY (N/A )  Patient Location: PACU  Anesthesia Type:General  Level of Consciousness: awake, alert , oriented and patient cooperative  Airway & Oxygen Therapy: Patient Spontanous Breathing and Patient connected to face mask oxygen  Post-op Assessment: Report given to RN, Post -op Vital signs reviewed and stable and Patient moving all extremities  Post vital signs: Reviewed and stable  Last Vitals:  Vitals Value Taken Time  BP 174/92 12/26/2018 11:32 AM  Temp    Pulse 78 12/26/2018 11:34 AM  Resp 13 12/26/2018 11:34 AM  SpO2 100 % 12/26/2018 11:34 AM  Vitals shown include unvalidated device data.  Last Pain:  Vitals:   12/26/18 0811  TempSrc: Oral         Complications: No apparent anesthesia complications

## 2018-12-27 ENCOUNTER — Encounter (HOSPITAL_COMMUNITY): Payer: Self-pay | Admitting: Urology

## 2018-12-29 DIAGNOSIS — D413 Neoplasm of uncertain behavior of urethra: Secondary | ICD-10-CM | POA: Diagnosis not present

## 2019-01-03 DIAGNOSIS — R310 Gross hematuria: Secondary | ICD-10-CM | POA: Diagnosis not present

## 2019-01-03 DIAGNOSIS — N361 Urethral diverticulum: Secondary | ICD-10-CM | POA: Diagnosis not present

## 2019-03-23 ENCOUNTER — Ambulatory Visit: Payer: BLUE CROSS/BLUE SHIELD | Admitting: Obstetrics and Gynecology

## 2019-03-28 DIAGNOSIS — E7800 Pure hypercholesterolemia, unspecified: Secondary | ICD-10-CM | POA: Diagnosis not present

## 2019-03-28 DIAGNOSIS — D509 Iron deficiency anemia, unspecified: Secondary | ICD-10-CM | POA: Diagnosis not present

## 2019-03-28 DIAGNOSIS — Z0000 Encounter for general adult medical examination without abnormal findings: Secondary | ICD-10-CM | POA: Diagnosis not present

## 2019-03-28 DIAGNOSIS — I10 Essential (primary) hypertension: Secondary | ICD-10-CM | POA: Diagnosis not present

## 2019-04-10 NOTE — Progress Notes (Signed)
53 y.o. G66P0000 Married Caucasian female here for annual exam.    Left breast tenderness at times where she had breast biopsy in 2019.  She had fibrocystic change.   Some hot flashes which are manageable.   Patient had a cystoscopy and biopsy with urology due to urethral bleeding.  She has cystitis cystica.  Bleeding stopped.  She denies pelvic pain.   PCP: Maurice Small, MD    Patient's last menstrual period was 11/25/2009.           Sexually active: Yes.    The current method of family planning is status post hysterectomy.    Exercising: No.  The patient does not participate in regular exercise at present. Smoker:  no  Health Maintenance: Pap: 11/25/08 Neg:Neg HR HPV History of abnormal Pap:  Yes, years ago per patient - normal since MMG: 10-12-17 Neg/density B/BiRads1--patient knows to schedule Colonoscopy: 05/02/15 - polyps - repeat 5-10 years BMD:   n/a  Result  n/a TDaP:  2014 Gardasil:   n/a JME:QASTMHDQ in past Hep C:negative in past Screening Labs:  PCP.     reports that she has never smoked. She has never used smokeless tobacco. She reports current alcohol use. She reports that she does not use drugs.  Past Medical History:  Diagnosis Date  . Anemia    HX  . Chronic migraine    neurologist-  dr Metta Clines  . Depression   . Gastric ulcer 2019  . Hemorrhoids   . Hyperlipidemia   . Hypertension 2010   Dr. Arbie Cookey webb. stable  . OCD (obsessive compulsive disorder)   . PONV (postoperative nausea and vomiting)   . Urethral lesion     Past Surgical History:  Procedure Laterality Date  . BREAST BIOPSY Left 07/06/2018   Procedure: LEFT BREAST EXCISIONAL BIOSPY;  Surgeon: [name], MD;  Location: [facility];  Service: General;  Laterality: Left;  . CYSTOSCOPY WITH BIOPSY N/A 12/26/2018   Procedure: CYSTOSCOPY WITH URETHRAL AND TRANSVAGINAL BIOPSY;  Surgeon: Irine Seal, MD;  Location: [facility];  Service: Urology;  Laterality: N/A;  . D & C  HYSTEROSCOPY WITH RESECTION ENDOMETRIAL POLYP  12-17-2008  dr Joan Flores @[facility]   . LAPAROSCOPIC APPENDECTOMY N/A 10/31/2015   Procedure: APPENDECTOMY LAPAROSCOPIC;  Surgeon: Erroll Luna, MD;  Location: [place];  Service: General;  Laterality: N/A;  . ROBOTIC ASSISTED TOTAL HYSTERECTOMY  04/ 05/11    dr romine @ [facility]   simple hyperplasia, fibroids  . TONSILLECTOMY  child    Current Outpatient Medications  Medication Sig Dispense Refill  . amLODipine (NORVASC) 5 MG tablet Take 5 mg by mouth every evening.     . Aspirin-Acetaminophen-Caffeine (GOODYS EXTRA STRENGTH PO) Take 1 Package by mouth daily as needed (pain/headache).     Marland Kitchen azelastine (ASTELIN) 0.1 % nasal spray Place 1 spray into both nostrils daily.     Marland Kitchen Bioflavonoid Products (ESTER-C) TABS Take 1 tablet by mouth daily.    . Camphor-Menthol (TIGER BALM EXTRA STRENGTH EX) Apply 1 application topically as needed (muscle pain).    . Cholecalciferol (VITAMIN D PO) Take 5,000 Int'l Units by mouth daily.    . citalopram (CELEXA) 40 MG tablet Take 40 mg by mouth at bedtime.     . diphenhydrAMINE (BENADRYL ALLERGY) 25 MG tablet Take 25 mg by mouth daily as needed (allergies).     Eduard Roux (AIMOVIG) 70 MG/ML SOAJ Inject 70 mg into the skin every 30 (thirty) days. 1 pen 5  .  ferrous sulfate 325 (65 FE) MG tablet Take 325 mg by mouth daily with breakfast.    . fluticasone (FLONASE) 50 MCG/ACT nasal spray Place 1 spray into both nostrils daily as needed for allergies or rhinitis.    Marland Kitchen ketotifen (ZADITOR) 0.025 % ophthalmic solution Place 1 drop into both eyes 2 (two) times daily as needed (eye allergies).     . Melatonin 10 MG TABS Take 10 mg by mouth at bedtime as needed (sleep).    . Misc Natural Products (OSTEO BI-FLEX JOINT SHIELD PO) Take 1 tablet by mouth daily.     Marland Kitchen omeprazole (PRILOSEC) 40 MG capsule Take 40 mg by mouth daily.   3  . pravastatin (PRAVACHOL) 20 MG tablet Take 20 mg by mouth once a week.    . Probiotic Product  (PROBIOTIC-10 PO) Take 1 capsule by mouth daily.     Marland Kitchen Propylene Glycol (SYSTANE BALANCE OP) Place 1 drop into both eyes as needed (dry eyes).     . SUMAtriptan (IMITREX) 100 MG tablet Take 1 tablet earliest onset of migraine.  May repeat once in 2 hours if headache persists or recurs. 10 tablet 3  . vitamin B-12 (CYANOCOBALAMIN) 500 MCG tablet Take 500 mcg by mouth daily.    Marland Kitchen zinc gluconate 50 MG tablet Take 50 mg by mouth daily.     No current facility-administered medications for this visit.     Family History  Problem Relation Age of Onset  . Heart disease Father   . Hyperlipidemia Mother   . Dementia Mother   . Heart disease Maternal Grandmother   . Heart disease Maternal Grandfather     Review of Systems  All other systems reviewed and are negative.   Exam:   BP 130/72   Pulse 76   Temp (!) 97.3 F (36.3 C) (Temporal)   Resp 18   Ht 5' 4.5" (1.638 m)   Wt 132 lb (59.9 kg)   LMP 11/25/2009 Comment: age  BMI 22.31 kg/m     General appearance: alert, cooperative and appears stated age Head: normocephalic, without obvious abnormality, atraumatic Neck: no adenopathy, supple, symmetrical, trachea midline and thyroid normal to inspection and palpation Lungs: clear to auscultation bilaterally Breasts: right - normal appearance, no masses or tenderness, No nipple retraction or dimpling, No nipple discharge or bleeding, No axillary adenopathy Left with scr in medial inferior breast.  Right side tender and more firm.  No specific mass.  Heart: regular rate and rhythm Abdomen: soft, non-tender; no masses, no organomegaly Extremities: extremities normal, atraumatic, no cyanosis or edema Skin: skin color, texture, turgor normal. No rashes or lesions Lymph nodes: cervical, supraclavicular, and axillary nodes normal. Neurologic: grossly normal  Pelvic: External genitalia:  no lesions              No abnormal inguinal nodes palpated.              Urethra: urethral prolapse and  scar below this consistent with prior biopsy              Bartholins and Skenes: normal                 Vagina: normal appearing vagina with normal color and discharge               Cervix: absent              Pap taken: No. Bimanual Exam:  Uterus:  absent  Adnexa: no mass, fullness, tenderness              Rectal exam: Yes.  .  Confirms.              Anus:  normal sphincter tone, no lesions  Chaperone was present for exam.  Assessment:   Well woman visit with normal exam. Status post hysterectomy. Ovaries remain.  Perimenopausal female.  Left breast pain and firmness of scar.  Dx fibrocystic change.  Cystitic cystica.  Hx migraine with aura. Hx ulcer.  Vegan.   Plan: Mammogram bilateral dx and left breast US at [facility]  Self breast awareness reviewed. Pap and HR HPV as above. Guidelines for Calcium, Vitamin D, regular exercise program including cardiovascular and weight bearing exercise. We discussed remedies for menopausal symptoms - estrogen, SSRI/SNRI, and Neurontin.  FU yearly and prn.   After visit summary provided.

## 2019-04-11 ENCOUNTER — Encounter: Payer: Self-pay | Admitting: Obstetrics and Gynecology

## 2019-04-11 ENCOUNTER — Telehealth: Payer: Self-pay | Admitting: Obstetrics and Gynecology

## 2019-04-11 ENCOUNTER — Ambulatory Visit (INDEPENDENT_AMBULATORY_CARE_PROVIDER_SITE_OTHER): Payer: BC Managed Care – PPO | Admitting: Obstetrics and Gynecology

## 2019-04-11 ENCOUNTER — Other Ambulatory Visit: Payer: Self-pay

## 2019-04-11 VITALS — BP 130/72 | HR 76 | Temp 97.3°F | Resp 18 | Ht 64.5 in | Wt 132.0 lb

## 2019-04-11 DIAGNOSIS — Z01419 Encounter for gynecological examination (general) (routine) without abnormal findings: Secondary | ICD-10-CM

## 2019-04-11 DIAGNOSIS — N644 Mastodynia: Secondary | ICD-10-CM

## 2019-04-11 DIAGNOSIS — N6320 Unspecified lump in the left breast, unspecified quadrant: Secondary | ICD-10-CM

## 2019-04-11 NOTE — Telephone Encounter (Signed)
Please schedule dx bilateral mammogram and left breast ultrasound at [facility].   Patient is having left breast pain at the sit of her prior excisional biopsy and has firmness in the medial portion of her scar.

## 2019-04-11 NOTE — Patient Instructions (Signed)

## 2019-04-11 NOTE — Telephone Encounter (Signed)
Bilateral diagnostic mammogram and left breast ultrasound scheduled for Tuesday, 04-17-19 at 930 am at [facility].   Patient notified af appointment date and time and is agreeable.

## 2019-04-17 ENCOUNTER — Encounter: Payer: Self-pay | Admitting: Obstetrics and Gynecology

## 2019-04-17 DIAGNOSIS — N644 Mastodynia: Secondary | ICD-10-CM | POA: Diagnosis not present

## 2019-07-06 ENCOUNTER — Telehealth: Payer: Self-pay | Admitting: Neurology

## 2019-07-06 NOTE — Telephone Encounter (Signed)
She has Pharmacist, community, so she will likely qualify for the [facility] access card where it would cost her no more than $5 a month.  She may come to the office to pick up a card.  Then she should make a follow up appointment in 4 months.

## 2019-07-06 NOTE — Telephone Encounter (Signed)
Patient left msg with after hours and would like to talk to MD about trying different medication for her headaches. Thanks!

## 2019-07-06 NOTE — Telephone Encounter (Signed)
Called patient no answer left provider response on voice mail

## 2019-07-06 NOTE — Telephone Encounter (Signed)
Pt return call current medication is Aimovig which she has not take in months reason cost  Sumatriptan 100 mg   Patient requesting something that she can take on a daily bases. She states that she has headaches daily.  Pharmacy: [facility]

## 2019-07-06 NOTE — Telephone Encounter (Signed)
Called patient no answer left message for patient to call back with additional information.

## 2019-07-10 DIAGNOSIS — Z20828 Contact with and (suspected) exposure to other viral communicable diseases: Secondary | ICD-10-CM | POA: Diagnosis not present

## 2019-07-11 DIAGNOSIS — R5383 Other fatigue: Secondary | ICD-10-CM | POA: Diagnosis not present

## 2019-07-11 DIAGNOSIS — J029 Acute pharyngitis, unspecified: Secondary | ICD-10-CM | POA: Diagnosis not present

## 2019-07-16 ENCOUNTER — Other Ambulatory Visit: Payer: Self-pay | Admitting: Neurology

## 2019-07-26 DIAGNOSIS — E7800 Pure hypercholesterolemia, unspecified: Secondary | ICD-10-CM | POA: Diagnosis not present

## 2019-08-06 ENCOUNTER — Other Ambulatory Visit: Payer: Self-pay | Admitting: Neurology

## 2019-09-01 IMAGING — MR MRI PELVIS WITHOUT AND WITH CONTRAST
4 of 8 series · 19 of 48 positions shown · IV contrast (Yes)
Comparison: None.

CLINICAL DATA: Hematuria.  Urethral lesion.  Previous hysterectomy.

EXAM:
MRI PELVIS WITHOUT AND WITH CONTRAST
TECHNIQUE: Multiplanar multisequence MR imaging of the pelvis was performed
both before and after administration of intravenous contrast.
CONTRAST:  7 mL Gadavist

[Series 3: T2 · sagittal · 5.0mm · 0.47mm/px · 3 of 18 slices shown (1 of 2)]
[im 1/18]
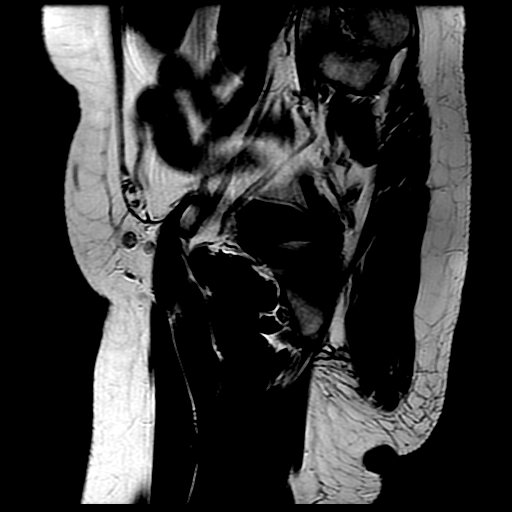
[im 9/18]
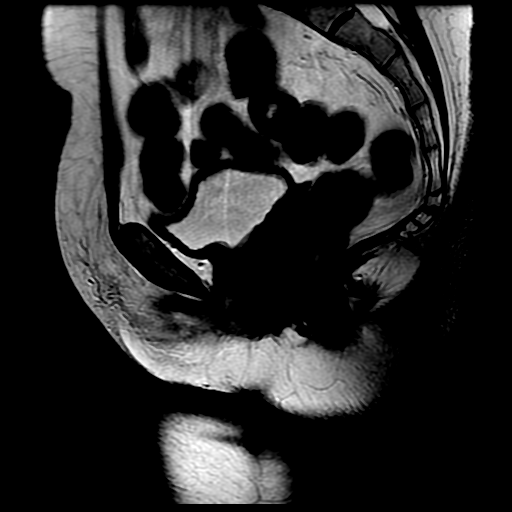
[im 18/18]
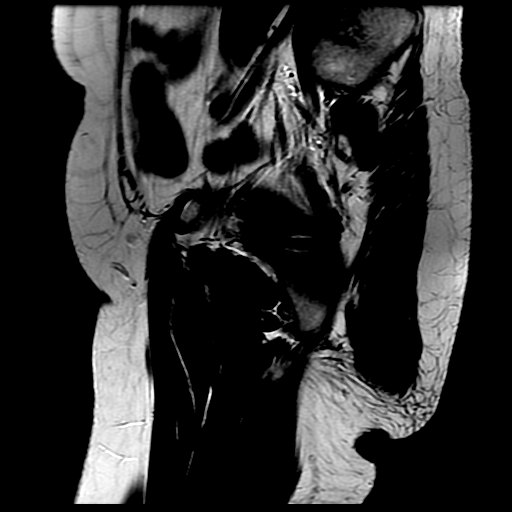

[Series 4: T2 fat-sat · sagittal · 4.0mm · 0.35mm/px · 7 of 31 slices shown (1 of 2)]
[im 1/31]
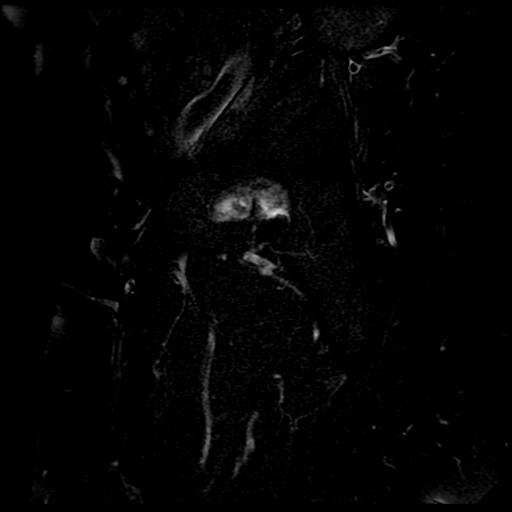
[im 6/31]
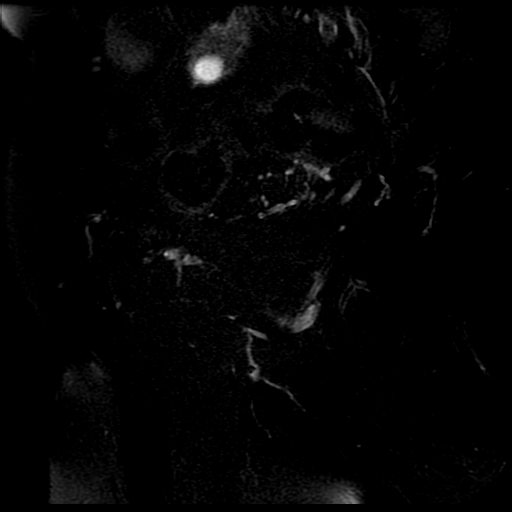
[im 11/31]
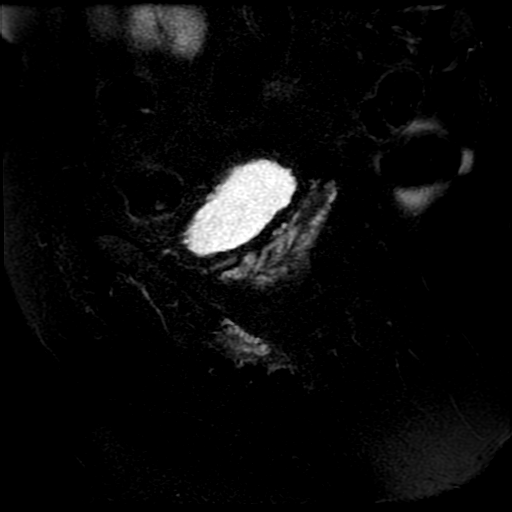
[im 16/31]
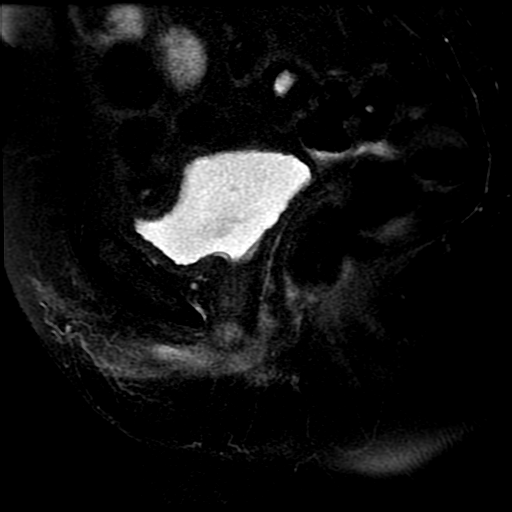
[im 21/31]
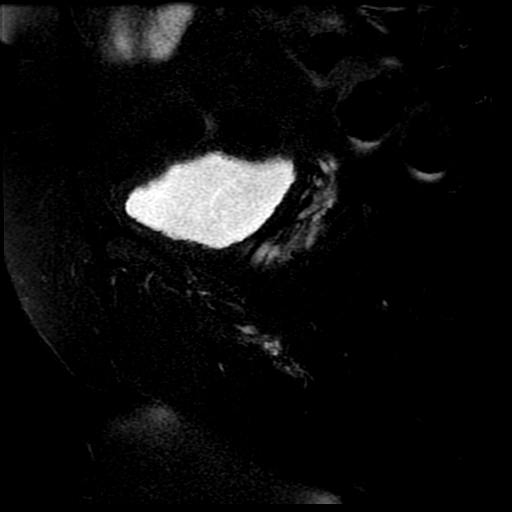
[im 26/31]
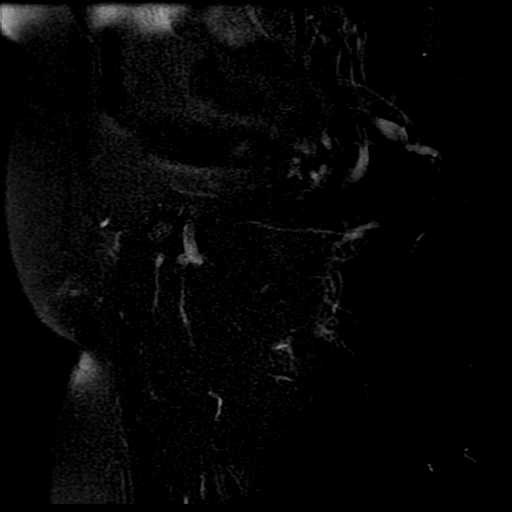
[im 31/31]
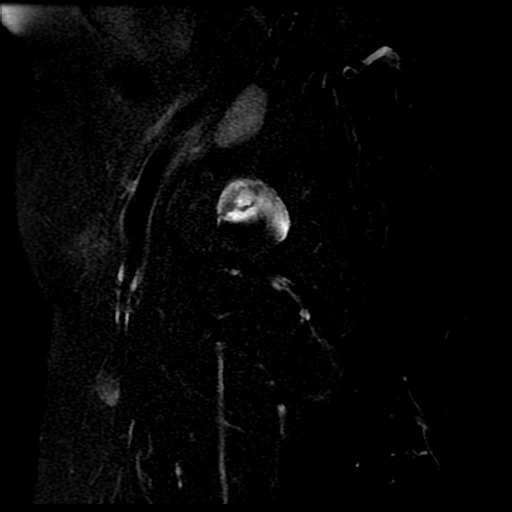

[Series 5: T2 · axial · 5.0mm · 0.47mm/px · z∈[-38,+136]mm · 6 of 30 slices shown (2 of 2)]
[im 1/30]
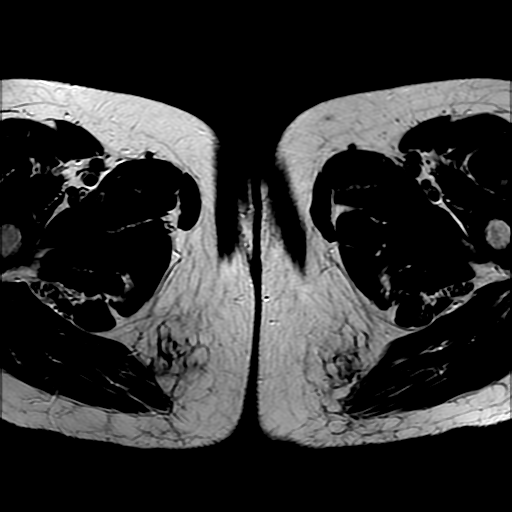
[im 6/30]
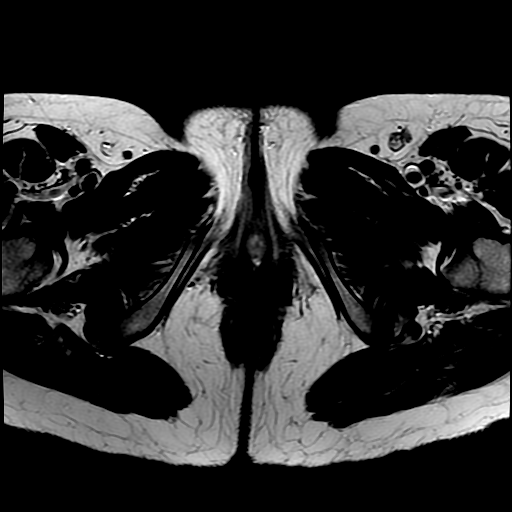
[im 12/30]
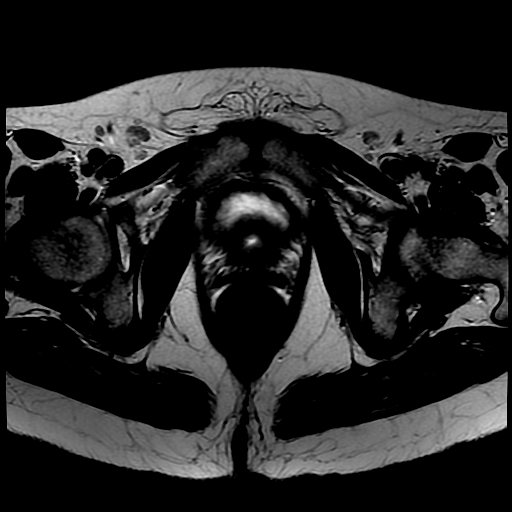
[im 18/30]
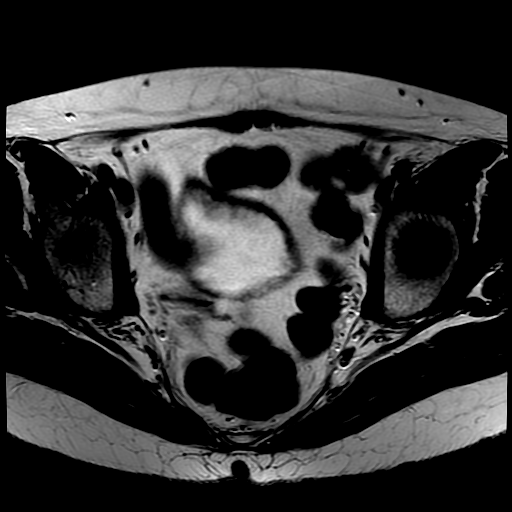
[im 24/30]
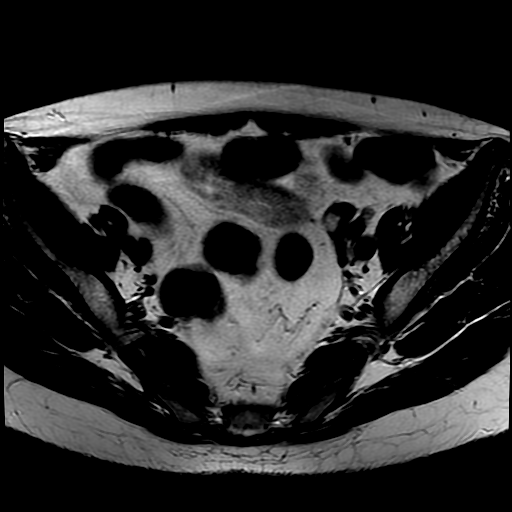
[im 30/30]
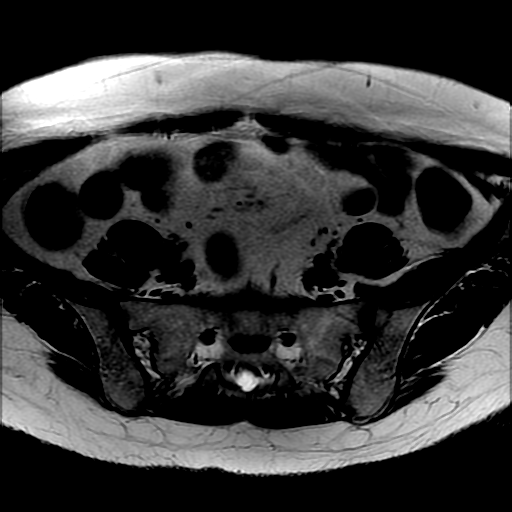

[Series 6: T2 fat-sat · axial · 4.0mm · 0.39mm/px · z∈[-18,+66]mm · 3 of 27 slices shown (2 of 2)]
[im 6/27]
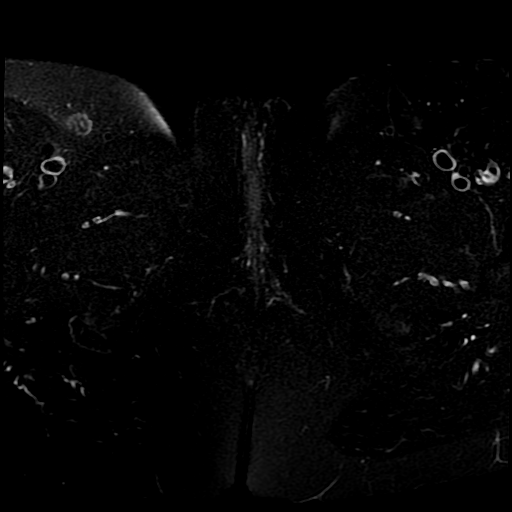
[im 16/27]
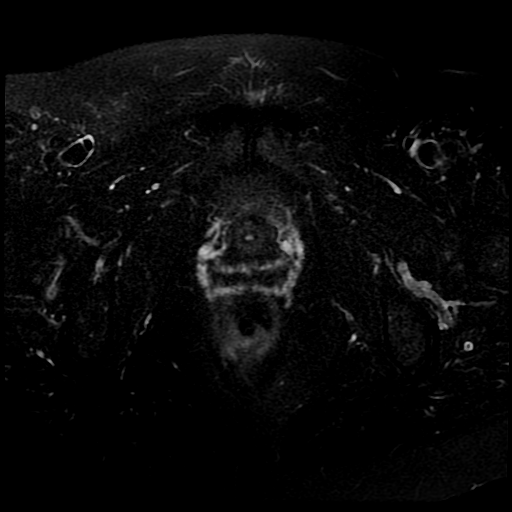
[im 27/27]
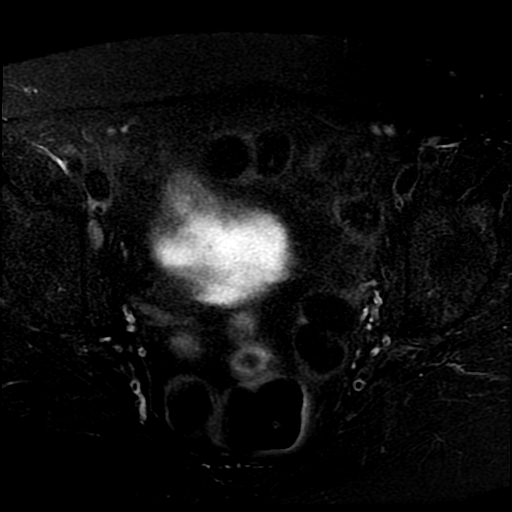

[19 of 48 positions shown; findings below may reference images not displayed]

FINDINGS: Lower Urinary Tract: Normal appearance of urinary bladder. A small
lesion showing peripheral contrast enhancement and adjacent edema is
seen at the external urethral meatus, which measures approximately
1.5 x 1.7 cm. This has a nonspecific appearance.

Bowel: Unremarkable appearance of rectum and other visualized pelvic
bowel loops.

Vascular/Lymphatic: 9 mm right internal iliac lymph node noted,
which is at the upper limit of normal in size. No other significant
abnormality identified.

Reproductive:

-- Uterus: Prior hysterectomy. Vaginal cuff is normal in appearance.

-- Right ovary: Appears normal. No mass or inflammatory process
identified.

-- Left ovary: Appears normal. No mass or inflammatory process
identified.

Other: No peritoneal thickening or abnormal free fluid.

Musculoskeletal:  Unremarkable.
IMPRESSION: 1.7 cm peripherally enhancing lesion at the external urethral
meatus. Differential diagnosis includes an infected [place] gland
cyst, and less likely, an infected urethral diverticulum or
superficial carcinoma.

Single upper-normal size right internal iliac lymph node, of
uncertain clinical significance.

## 2019-10-22 ENCOUNTER — Telehealth: Payer: Self-pay | Admitting: Neurology

## 2019-10-22 NOTE — Telephone Encounter (Signed)
[name] (Key: [id number]) Rx #: [id number] Aimovig 70MG /ML auto-injectors   Form [employer] Form [phone] NCPDP) Created 16 hours ago Sent to Plan 6 minutes ago Plan Response 6 minutes ago Submit Clinical Questions 1 minute ago Determination Wait for Determination Please wait for [employer] 2017 to return a determination.

## 2019-10-25 NOTE — Telephone Encounter (Addendum)
Received call from [employer] with approval of medication from 11/05/19 to 11/02/20.

## 2019-10-29 DIAGNOSIS — H66001 Acute suppurative otitis media without spontaneous rupture of ear drum, right ear: Secondary | ICD-10-CM | POA: Diagnosis not present

## 2019-10-30 DIAGNOSIS — R05 Cough: Secondary | ICD-10-CM | POA: Diagnosis not present

## 2019-10-30 DIAGNOSIS — J029 Acute pharyngitis, unspecified: Secondary | ICD-10-CM | POA: Diagnosis not present

## 2019-11-22 DIAGNOSIS — F3342 Major depressive disorder, recurrent, in full remission: Secondary | ICD-10-CM | POA: Diagnosis not present

## 2019-12-14 DIAGNOSIS — J0100 Acute maxillary sinusitis, unspecified: Secondary | ICD-10-CM | POA: Diagnosis not present

## 2020-03-05 ENCOUNTER — Emergency Department (HOSPITAL_COMMUNITY)
Admission: EM | Admit: 2020-03-05 | Discharge: 2020-03-06 | Disposition: A | Discharge status: Home / Self Care | Payer: BC Managed Care – PPO | Attending: Emergency Medicine | Admitting: Emergency Medicine

## 2020-03-05 ENCOUNTER — Encounter (HOSPITAL_COMMUNITY): Payer: Self-pay

## 2020-03-05 ENCOUNTER — Emergency Department (HOSPITAL_COMMUNITY): Payer: BC Managed Care – PPO

## 2020-03-05 DIAGNOSIS — M25472 Effusion, left ankle: Secondary | ICD-10-CM | POA: Diagnosis not present

## 2020-03-05 DIAGNOSIS — Y999 Unspecified external cause status: Secondary | ICD-10-CM | POA: Diagnosis not present

## 2020-03-05 DIAGNOSIS — I10 Essential (primary) hypertension: Secondary | ICD-10-CM | POA: Insufficient documentation

## 2020-03-05 DIAGNOSIS — Y9301 Activity, walking, marching and hiking: Secondary | ICD-10-CM | POA: Diagnosis not present

## 2020-03-05 DIAGNOSIS — Y929 Unspecified place or not applicable: Secondary | ICD-10-CM | POA: Insufficient documentation

## 2020-03-05 DIAGNOSIS — W268XXA Contact with other sharp object(s), not elsewhere classified, initial encounter: Secondary | ICD-10-CM | POA: Insufficient documentation

## 2020-03-05 DIAGNOSIS — Z79899 Other long term (current) drug therapy: Secondary | ICD-10-CM | POA: Diagnosis not present

## 2020-03-05 DIAGNOSIS — Z7982 Long term (current) use of aspirin: Secondary | ICD-10-CM | POA: Diagnosis not present

## 2020-03-05 DIAGNOSIS — M7732 Calcaneal spur, left foot: Secondary | ICD-10-CM | POA: Diagnosis not present

## 2020-03-05 DIAGNOSIS — S93402A Sprain of unspecified ligament of left ankle, initial encounter: Secondary | ICD-10-CM | POA: Insufficient documentation

## 2020-03-05 DIAGNOSIS — S99912A Unspecified injury of left ankle, initial encounter: Secondary | ICD-10-CM | POA: Diagnosis not present

## 2020-03-05 DIAGNOSIS — S93401A Sprain of unspecified ligament of right ankle, initial encounter: Secondary | ICD-10-CM | POA: Diagnosis not present

## 2020-03-05 NOTE — ED Triage Notes (Signed)
Pt arrives POV for eval of L sided ankle pain after "moving it weird in the car tonight". Denies injury/trauma. Minimal swelling noted to L side of ankle, +DP pulses. No bruising

## 2020-03-06 DIAGNOSIS — S93492A Sprain of other ligament of left ankle, initial encounter: Secondary | ICD-10-CM | POA: Diagnosis not present

## 2020-03-06 DIAGNOSIS — M25572 Pain in left ankle and joints of left foot: Secondary | ICD-10-CM | POA: Diagnosis not present

## 2020-03-06 NOTE — Discharge Instructions (Signed)
Take tylenol or motrin for pain.  Can ice/elevate ankle to help with swelling. Follow-up with orthopedics-- call in the next few days if symptoms not improving. Return to the ED for new or worsening symptoms.

## 2020-03-06 NOTE — ED Provider Notes (Signed)
[facility] EMERGENCY DEPARTMENT Provider Note   CSN: [id number] Arrival date & time: 03/05/20  2044     History Chief Complaint  Patient presents with  . Ankle Pain    Kristina Dunlap is a 54 y.o. female.  The history is provided by the patient and medical records.  Ankle Pain   54 year old female with history of anemia, chronic migraines, depression, hyperlipidemia, hypertension, presenting to the ED with left ankle pain.  States she was driving car today and had her left foot on the foot rest beside the right pedal.  States she stopped and went to move her foot and all of a sudden felt sharp, shooting pain in her lateral left ankle.  States she has had a hard time walking on her foot since this occurred.  States most of her pain is along the lateral aspect of the ankle, she has noticed some mild swelling.  Denies any numbness of the left foot.  She has been using her son's crutches at home to help keep the weight off.  She has not tried any medications or other intervention.  Past Medical History:  Diagnosis Date  . Anemia    HX  . Chronic migraine    neurologist-  dr Metta Clines  . Depression   . Gastric ulcer 2019  . Hemorrhoids   . Hyperlipidemia   . Hypertension 2010   Dr. Arbie Cookey webb. stable  . OCD (obsessive compulsive disorder)   . PONV (postoperative nausea and vomiting)   . Urethral lesion     Patient Active Problem List   Diagnosis Date Noted  . Scapular dyskinesis 04/19/2018  . Nonallopathic lesion of cervical region 04/19/2018  . Nonallopathic lesion of rib cage 04/19/2018  . Nonallopathic lesion of thoracic region 04/19/2018  . Left breast mass 03/04/2018  . Bursitis of right shoulder 02/28/2018  . Pain in right foot 01/25/2018  . Chronic right shoulder pain 01/25/2018  . Hyperlipidemia 10/29/2016  . Panic attack 10/29/2016  . Acute appendicitis 10/31/2015  . Hypertension   . Migraine headache with aura   . OCD (obsessive compulsive  disorder)   . Hemorrhoids 08/30/2012    Past Surgical History:  Procedure Laterality Date  . BREAST BIOPSY Left 07/06/2018   Procedure: LEFT BREAST EXCISIONAL BIOSPY;  Surgeon: [name], MD;  Location: [place] [facility];  Service: General;  Laterality: Left;  . CYSTOSCOPY WITH BIOPSY N/A 12/26/2018   Procedure: CYSTOSCOPY WITH URETHRAL AND TRANSVAGINAL BIOPSY;  Surgeon: Irine Seal, MD;  Location: [facility];  Service: Urology;  Laterality: N/A;  . D & C HYSTEROSCOPY WITH RESECTION ENDOMETRIAL POLYP  12-17-2008  dr Joan Flores @[facility]   . LAPAROSCOPIC APPENDECTOMY N/A 10/31/2015   Procedure: APPENDECTOMY LAPAROSCOPIC;  Surgeon: Erroll Luna, MD;  Location: [facility];  Service: General;  Laterality: N/A;  . ROBOTIC ASSISTED TOTAL HYSTERECTOMY  04/ 05/11    dr romine @ [facility]   simple hyperplasia, fibroids  . TONSILLECTOMY  child     OB History    Gravida  0   Para  0   Term  0   Preterm  0   AB  0   Living  0     SAB  0   TAB  0   Ectopic  0   Multiple  0   Live Births              Family History  Problem Relation Age of Onset  . Heart disease Father   .  Hyperlipidemia Mother   . Dementia Mother   . Heart disease Maternal Grandmother   . Heart disease Maternal Grandfather     Social History   Tobacco Use  . Smoking status: Never Smoker  . Smokeless tobacco: Never Used  Vaping Use  . Vaping Use: Never used  Substance Use Topics  . Alcohol use: Yes    Comment: maybe 1-2/month  . Drug use: No    Home Medications Prior to Admission medications   Medication Sig Start Date End Date Taking? Authorizing Provider  AIMOVIG 70 MG/ML SOAJ ADMINISTER 1 ML UNDER THE SKIN EVERY 30 DAYS. 07/16/19   Tomi Likens, Adam R, DO  amLODipine (NORVASC) 5 MG tablet Take 5 mg by mouth every evening.     [provider]  Aspirin-Acetaminophen-Caffeine (GOODYS EXTRA STRENGTH PO) Take 1 Package by mouth daily as needed (pain/headache).     [provider]  azelastine  (ASTELIN) 0.1 % nasal spray Place 1 spray into both nostrils daily.  10/16/18   [provider]  Bioflavonoid Products (ESTER-C) TABS Take 1 tablet by mouth daily.    [provider]  Camphor-Menthol (TIGER BALM EXTRA STRENGTH EX) Apply 1 application topically as needed (muscle pain).    [provider]  Cholecalciferol (VITAMIN D PO) Take 5,000 Int'l Units by mouth daily.    [provider]  citalopram (CELEXA) 40 MG tablet Take 40 mg by mouth at bedtime.     [provider]  diphenhydrAMINE (BENADRYL ALLERGY) 25 MG tablet Take 25 mg by mouth daily as needed (allergies).     [provider]  ferrous sulfate 325 (65 FE) MG tablet Take 325 mg by mouth daily with breakfast.    [provider]  fluticasone (FLONASE) 50 MCG/ACT nasal spray Place 1 spray into both nostrils daily as needed for allergies or rhinitis.    [provider]  ketotifen (ZADITOR) 0.025 % ophthalmic solution Place 1 drop into both eyes 2 (two) times daily as needed (eye allergies).     [provider]  Melatonin 10 MG TABS Take 10 mg by mouth at bedtime as needed (sleep).    [provider]  Misc Natural Products (OSTEO BI-FLEX JOINT SHIELD PO) Take 1 tablet by mouth daily.     [provider]  omeprazole (PRILOSEC) 40 MG capsule Take 40 mg by mouth daily.  12/30/17   [provider]  pravastatin (PRAVACHOL) 20 MG tablet Take 20 mg by mouth once a week.    [provider]  Probiotic Product (PROBIOTIC-10 PO) Take 1 capsule by mouth daily.     [provider]  Propylene Glycol (SYSTANE BALANCE OP) Place 1 drop into both eyes as needed (dry eyes).     [provider]  SUMAtriptan (IMITREX) 100 MG tablet TAKE 1 TABLET BY MOUTH AT ONSET OF MIGRAINE. MAY REPEAT DOSE ONCE IN 2 HOURS IF HEADACHE PERSITS OR RECURS 08/06/19   Pieter Partridge, DO  vitamin B-12 (CYANOCOBALAMIN) 500 MCG tablet Take 500 mcg by mouth  daily.    [provider]  zinc gluconate 50 MG tablet Take 50 mg by mouth daily.    [provider]    Allergies    Erythromycin  Review of Systems   Review of Systems  Musculoskeletal: Positive for arthralgias.  All other systems reviewed and are negative.   Physical Exam Updated Vital Signs BP 137/82 (BP Location: Left Arm)   Pulse 74   Temp 98.3 F (36.8  C) (Oral)   Resp 18   Ht 5' 4.5" (1.638 m)   Wt 60 kg   LMP 11/25/2009 Comment: age  SpO2 99%   BMI 22.35 kg/m   Physical Exam Vitals and nursing note reviewed.  Constitutional:      Appearance: She is well-developed.  HENT:     Head: Normocephalic and atraumatic.  Eyes:     Conjunctiva/sclera: Conjunctivae normal.     Pupils: Pupils are equal, round, and reactive to light.  Cardiovascular:     Rate and Rhythm: Normal rate and regular rhythm.     Heart sounds: Normal heart sounds.  Pulmonary:     Effort: Pulmonary effort is normal.     Breath sounds: Normal breath sounds.  Abdominal:     General: Bowel sounds are normal.     Palpations: Abdomen is soft.  Musculoskeletal:        General: Normal range of motion.     Cervical back: Normal range of motion.     Comments: Left foot and ankle grossly normal in appearance, there is very minimal swelling along the anterior aspect of lateral malleolus, no bony deformity, DP pulse intact, moving toes normally  Skin:    General: Skin is warm and dry.  Neurological:     Mental Status: She is alert and oriented to person, place, and time.     ED Results / Procedures / Treatments   Labs (all labs ordered are listed, but only abnormal results are displayed) Labs Reviewed - No data to display  EKG None  Radiology DG Ankle Complete Left  Result Date: 03/05/2020 CLINICAL DATA:  Left ankle pain. EXAM: LEFT ANKLE COMPLETE - 3+ VIEW COMPARISON:  None. FINDINGS: There is no evidence of fracture or dislocation. Normal alignment and joint spaces.  Plantar calcaneal spur and Achilles tendon enthesophyte. There is an ankle joint effusion. Soft tissues are unremarkable. IMPRESSION: 1. Ankle joint effusion. No acute osseous abnormality. 2. Plantar calcaneal spur and Achilles tendon enthesophyte. Electronically Signed   By: Keith Rake M.D.   On: 03/05/2020 21:47    Procedures Procedures (including critical care time)  Medications Ordered in ED Medications - No data to display  ED Course  I have reviewed the triage vital signs and the nursing notes.  Pertinent labs & imaging results that were available during my care of the patient were reviewed by me and considered in my medical decision making (see chart for details).    MDM Rules/Calculators/A&P  54 year old female presenting with atraumatic pain that occurred while driving today.  Pain along the lateral left ankle, very minimal swelling.  No bony deformity on exam.  Foot is neurovascularly intact.  X-ray with small effusion but no bony deformity.  Does have some calcaneal spurs.  No deformity over the Achilles.  Patient placed in ASO, has crutches that she will use.  She has been seen by [facility] in the past, will have her follow-up with them if ongoing issues.  Encouraged RICE routine, anti-inflammatories.  May return here for any new or acute changes.  Final Clinical Impression(s) / ED Diagnoses Final diagnoses:  Sprain of left ankle, unspecified ligament, initial encounter    Rx / DC Orders ED Discharge Orders    None       Larene Pickett, PA-C 03/06/20 0520    Ward, Delice Bison, DO 03/06/20 [phone]

## 2020-03-06 NOTE — ED Notes (Signed)
Discharge instructions reviewed with pt. Pt verbalized understanding.   

## 2020-03-10 NOTE — Progress Notes (Deleted)
NEUROLOGY FOLLOW UP OFFICE NOTE  Kristina Dunlap [id number]  HISTORY OF PRESENT ILLNESS: Kristina Dunlap is a 54 year old right-handed woman with hypercholesterolemia, hypertension, and GERD who follows up for migraine.  UPDATE: Last seen in April 2020.  At that time, I started her on Aimovig.   Intensity:  *** Duration:  *** Frequency:  *** Current NSAIDS:No (contraindicated as she has a stomach ulcer) Current analgesics:Goody powder, Tylenol (ineffective) Current triptans: Sumatriptan 100 mg Current ergotamine:no Current anti-emetic:no Current muscle relaxants:no Current anti-anxiolytic:no Current sleep aide:no Current Antihypertensive medications: lisinopril-HCTZ Current Antidepressant medications:  citalopram 60mg  Current Anticonvulsant medications: None Current anti-CGRP:Aimovig 70mg  Current Vitamins/Herbal/Supplements:vitamin D Current Antihistamines/Decongestants: Zyrtec, Astelin NS Other therapy:no  Caffeine:Decaf tea, no coffee, occasional soda Alcohol:Occasional Smoker:no Diet:water Exercise:no Depression:no; Anxiety:no Other pain:no Sleep hygiene:good  HISTORY:  Onset: Headaches since 54 years old but became daily in 2000. Location:Bilateral periorbital/temples radiating into neck Quality:throbbing Initial intensity:Moderate to severe.[name] new headache, thunderclap headache or severe headache that wakes herfrom sleep. Aura:no Prodrome:no Postdrome:no Associated symptoms: Photophobia, phonophobia.[name] associatednausea, vomiting, visual disturbance,unilateral numbness or weakness. Initial duration:All day Initial Frequency:Daily (severe headaches occur once a month) Initial Frequency of abortive medication:Tylenol 3 days a week Triggers: None Aggravating factors: Bending over Relieving factors: None Activity:Aggravated only when severe  Past NSAIDS:Ibuprofen,  naproxen Past analgesics:Goody powder, Fioricet, Excedrin Past abortive triptans:no Past abortive ergotamine:no Past muscle relaxants: tizanidine 4mg , balcofen 10mg  Past anti-emetic: Zofran ODT 4mg , promethazine suppository 25mg  Past antihypertensive medications: lisinopril, HCTZ Past antidepressant medications: bupropion Past anticonvulsant medications: Trokendi XR (made her feel dizzy) Past anti-CGRP:no Past vitamins/Herbal/Supplements:no Past antihistamines/decongestants:Flonase, Benadryl Other past therapies:no  Family history of headache:No  PAST MEDICAL HISTORY: Past Medical History:  Diagnosis Date  . Anemia    HX  . Chronic migraine    neurologist-  dr Metta Clines  . Depression   . Gastric ulcer 2019  . Hemorrhoids   . Hyperlipidemia   . Hypertension 2010   Dr. Arbie Cookey webb. stable  . OCD (obsessive compulsive disorder)   . PONV (postoperative nausea and vomiting)   . Urethral lesion     MEDICATIONS: Current Outpatient Medications on File Prior to Visit  Medication Sig Dispense Refill  . AIMOVIG 70 MG/ML SOAJ ADMINISTER 1 ML UNDER THE SKIN EVERY 30 DAYS. 1 mL 5  . amLODipine (NORVASC) 5 MG tablet Take 5 mg by mouth every evening.     . Aspirin-Acetaminophen-Caffeine (GOODYS EXTRA STRENGTH PO) Take 1 Package by mouth daily as needed (pain/headache).     Marland Kitchen azelastine (ASTELIN) 0.1 % nasal spray Place 1 spray into both nostrils daily.     Marland Kitchen Bioflavonoid Products (ESTER-C) TABS Take 1 tablet by mouth daily.    . Camphor-Menthol (TIGER BALM EXTRA STRENGTH EX) Apply 1 application topically as needed (muscle pain).    . Cholecalciferol (VITAMIN D PO) Take 5,000 Int'l Units by mouth daily.    . citalopram (CELEXA) 40 MG tablet Take 40 mg by mouth at bedtime.     . diphenhydrAMINE (BENADRYL ALLERGY) 25 MG tablet Take 25 mg by mouth daily as needed (allergies).     . ferrous sulfate 325 (65 FE) MG tablet Take 325 mg by mouth daily with breakfast.    .  fluticasone (FLONASE) 50 MCG/ACT nasal spray Place 1 spray into both nostrils daily as needed for allergies or rhinitis.    Marland Kitchen ketotifen (ZADITOR) 0.025 % ophthalmic solution Place 1 drop into both eyes 2 (two) times daily as needed (eye allergies).     Marland Kitchen  Melatonin 10 MG TABS Take 10 mg by mouth at bedtime as needed (sleep).    . Misc Natural Products (OSTEO BI-FLEX JOINT SHIELD PO) Take 1 tablet by mouth daily.     Marland Kitchen omeprazole (PRILOSEC) 40 MG capsule Take 40 mg by mouth daily.   3  . pravastatin (PRAVACHOL) 20 MG tablet Take 20 mg by mouth once a week.    . Probiotic Product (PROBIOTIC-10 PO) Take 1 capsule by mouth daily.     Marland Kitchen Propylene Glycol (SYSTANE BALANCE OP) Place 1 drop into both eyes as needed (dry eyes).     . SUMAtriptan (IMITREX) 100 MG tablet TAKE 1 TABLET BY MOUTH AT ONSET OF MIGRAINE. MAY REPEAT DOSE ONCE IN 2 HOURS IF HEADACHE PERSITS OR RECURS 32 tablet 3  . vitamin B-12 (CYANOCOBALAMIN) 500 MCG tablet Take 500 mcg by mouth daily.    Marland Kitchen zinc gluconate 50 MG tablet Take 50 mg by mouth daily.     No current facility-administered medications on file prior to visit.    ALLERGIES: Allergies  Allergen Reactions  . Erythromycin Nausea Only    FAMILY HISTORY: Family History  Problem Relation Age of Onset  . Heart disease Father   . Hyperlipidemia Mother   . Dementia Mother   . Heart disease Maternal Grandmother   . Heart disease Maternal Grandfather     SOCIAL HISTORY: Social History   Socioeconomic History  . Marital status: Married    Spouse name: Skip Estimable  . Number of children: 0  . Years of education: Not on file  . Highest education level: 12th grade  Occupational History  . 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 Smoking status: Never Smoker  . Smokeless tobacco: Never Used  Vaping Use  . Vaping Use: Never used  Substance and Sexual Activity  . Alcohol use: Yes    Comment: maybe 1-2/month  . Drug use: No  .  Sexual activity: Yes    Partners: Female    Birth control/protection: Surgical    Comment: R-TLH  Other Topics Concern  . Not on file  Social History Narrative   Patient is right-handed. She is married to her same sex partner. She occasionally drinks tea or soda. No exercise.   Social Determinants of Health   Financial Resource Strain:   . Difficulty of Paying Living Expenses:   Food Insecurity:   . Worried About Charity fundraiser in the Last Year:   . Arboriculturist in the Last Year:   Transportation Needs:   . Film/video editor (Medical):   Marland Kitchen Lack of Transportation (Non-Medical):   Physical Activity:   . Days of Exercise per Week:   . Minutes of Exercise per Session:   Stress:   . Feeling of Stress :   Social Connections:   . Frequency of Communication with Friends and Family:   . Frequency of Social Gatherings with Friends and Family:   . Attends Religious Services:   . Active Member of Clubs or Organizations:   . Attends Archivist Meetings:   Marland Kitchen Marital Status:   Intimate Partner Violence:   . Fear of Current or Ex-Partner:   . Emotionally Abused:   Marland Kitchen Physically Abused:   . Sexually Abused:     PHYSICAL EXAM: *** General: No acute distress.  Patient appears well-groomed.   Head:  Normocephalic/atraumatic Eyes:  Fundi examined but not visualized Neck: supple, no paraspinal tenderness,  full range of motion Heart:  Regular rate and rhythm Lungs:  Clear to auscultation bilaterally Back: No paraspinal tenderness Neurological Exam: alert and oriented to person, place, and time. Attention span and concentration intact, recent and remote memory intact, fund of knowledge intact.  Speech fluent and not dysarthric, language intact.  CN II-XII intact. Bulk and tone normal, muscle strength 5/5 throughout.  Sensation to light touch, temperature and vibration intact.  Deep tendon reflexes 2+ throughout, toes downgoing.  Finger to nose and heel to shin testing  intact.  Gait normal, Romberg negative.  IMPRESSION: Migraine ***  PLAN: 1.  For preventative management, *** 2.  For abortive therapy, *** 3.  Limit use of pain relievers to no more than 2 days out of week to prevent risk of rebound or medication-overuse headache. 4.  Keep headache diary 5.  Exercise, hydration, caffeine cessation, sleep hygiene, monitor for and avoid triggers 6. Follow up ***   Metta Clines, DO  CC: Maurice Small, MD

## 2020-03-11 ENCOUNTER — Ambulatory Visit: Payer: BLUE CROSS/BLUE SHIELD | Admitting: Neurology

## 2020-03-12 DIAGNOSIS — J329 Chronic sinusitis, unspecified: Secondary | ICD-10-CM | POA: Diagnosis not present

## 2020-03-12 DIAGNOSIS — H66001 Acute suppurative otitis media without spontaneous rupture of ear drum, right ear: Secondary | ICD-10-CM | POA: Diagnosis not present

## 2020-03-12 DIAGNOSIS — J40 Bronchitis, not specified as acute or chronic: Secondary | ICD-10-CM | POA: Diagnosis not present

## 2020-03-21 DIAGNOSIS — L820 Inflamed seborrheic keratosis: Secondary | ICD-10-CM | POA: Diagnosis not present

## 2020-03-25 ENCOUNTER — Other Ambulatory Visit: Payer: Self-pay | Admitting: Neurology

## 2020-03-25 ENCOUNTER — Telehealth: Payer: Self-pay | Admitting: Neurology

## 2020-03-25 MED ORDER — AIMOVIG 70 MG/ML ~~LOC~~ SOAJ: SUBCUTANEOUS | 3 refills | Status: DC

## 2020-03-25 NOTE — Telephone Encounter (Signed)
Prescription with 3 refills sent.  Patient will need to keep follow up appointment or will not be able to provide further refills.

## 2020-03-25 NOTE — Telephone Encounter (Signed)
Telephone call to pt, Advised pt the refill has to be approved by Tomi Likens. Pt last seen 11/2018

## 2020-03-25 NOTE — Telephone Encounter (Signed)
Patient is requesting refill of Aimovig be sent to [facility] on [address]. She has a follow up appointment scheduled for 08/05/20.

## 2020-03-25 NOTE — Telephone Encounter (Signed)
LMOVM, Dr. Tomi Likens note below

## 2020-04-07 DIAGNOSIS — Z0000 Encounter for general adult medical examination without abnormal findings: Secondary | ICD-10-CM | POA: Diagnosis not present

## 2020-04-07 DIAGNOSIS — D509 Iron deficiency anemia, unspecified: Secondary | ICD-10-CM | POA: Diagnosis not present

## 2020-04-07 DIAGNOSIS — R002 Palpitations: Secondary | ICD-10-CM | POA: Diagnosis not present

## 2020-04-07 DIAGNOSIS — E7800 Pure hypercholesterolemia, unspecified: Secondary | ICD-10-CM | POA: Diagnosis not present

## 2020-04-07 DIAGNOSIS — I10 Essential (primary) hypertension: Secondary | ICD-10-CM | POA: Diagnosis not present

## 2020-04-10 DIAGNOSIS — Z1211 Encounter for screening for malignant neoplasm of colon: Secondary | ICD-10-CM | POA: Diagnosis not present

## 2020-04-14 ENCOUNTER — Encounter: Payer: Self-pay | Admitting: Obstetrics and Gynecology

## 2020-04-14 ENCOUNTER — Ambulatory Visit (INDEPENDENT_AMBULATORY_CARE_PROVIDER_SITE_OTHER): Payer: BC Managed Care – PPO | Admitting: Obstetrics and Gynecology

## 2020-04-14 ENCOUNTER — Other Ambulatory Visit: Payer: Self-pay

## 2020-04-14 VITALS — BP 138/84 | HR 72 | Resp 14 | Ht 64.75 in | Wt 137.0 lb

## 2020-04-14 DIAGNOSIS — Z01419 Encounter for gynecological examination (general) (routine) without abnormal findings: Secondary | ICD-10-CM | POA: Diagnosis not present

## 2020-04-14 NOTE — Patient Instructions (Signed)

## 2020-04-14 NOTE — Progress Notes (Signed)
54 y.o. G58P0000 Married Caucasian female here for annual exam.    Hot flashes off and on.  Manageable.  Able to sleep ok.   Did physical therapy for her bladder.  Has frequency.  Drinks tea, 1 - 2 small cans Coke a day.  No loss of control of bladder.   Still has daily headaches.   Received her Covid vaccine, [employer].   PCP:  Maurice Small, MD   Patient's last menstrual period was 11/25/2009.           Sexually active: Yes.    The current method of family planning is status post hysterectomy.    Exercising: No.  The patient does not participate in regular exercise at present. Smoker:  no  Health Maintenance: Pap: 11/25/08 Neg:Neg HR HPV History of abnormal Pap:  Yes, years ago per patient - normal since MMG: 04-17-19 3D/Diag.Bil/density B/Neg--Lt.Br.US is recommended--Lt.Br.US Neg Colonoscopy: 05/02/15 - polyps - repeat 5-10 years BMD:   n/a  Result  n/a TDaP:  2014 Gardasil:   no HIV:Neg in the past Hep C:Neg in the past Screening Labs:  PCP.    reports that she has never smoked. She has never used smokeless tobacco. She reports current alcohol use. She reports that she does not use drugs.  Past Medical History:  Diagnosis Date  . Anemia    HX  . Chronic migraine    neurologist-  dr Metta Clines  . Depression   . Gastric ulcer 2019  . Hemorrhoids   . Hyperlipidemia   . Hypertension 2010   Dr. Arbie Cookey webb. stable  . OCD (obsessive compulsive disorder)   . PONV (postoperative nausea and vomiting)   . Urethral lesion     Past Surgical History:  Procedure Laterality Date  . BREAST BIOPSY Left 07/06/2018   Procedure: LEFT BREAST EXCISIONAL BIOSPY;  Surgeon: [name], MD;  Location: [facility];  Service: General;  Laterality: Left;  . CYSTOSCOPY WITH BIOPSY N/A 12/26/2018   Procedure: CYSTOSCOPY WITH URETHRAL AND TRANSVAGINAL BIOPSY;  Surgeon: Irine Seal, MD;  Location: [facility];  Service: Urology;  Laterality: N/A;  . D & C HYSTEROSCOPY  WITH RESECTION ENDOMETRIAL POLYP  12-17-2008  dr Joan Flores @[facility]   . LAPAROSCOPIC APPENDECTOMY N/A 10/31/2015   Procedure: APPENDECTOMY LAPAROSCOPIC;  Surgeon: Erroll Luna, MD;  Location: [facility];  Service: General;  Laterality: N/A;  . ROBOTIC ASSISTED TOTAL HYSTERECTOMY  04/ 05/11    dr romine @ [facility]   simple hyperplasia, fibroids  . TONSILLECTOMY  child    Current Outpatient Medications  Medication Sig Dispense Refill  . amLODipine (NORVASC) 5 MG tablet Take 5 mg by mouth every evening.     . Aspirin-Acetaminophen-Caffeine (GOODYS EXTRA STRENGTH PO) Take 1 Package by mouth daily as needed (pain/headache).     Marland Kitchen azelastine (ASTELIN) 0.1 % nasal spray Place 1 spray into both nostrils daily.     Marland Kitchen Bioflavonoid Products (ESTER-C) TABS Take 1 tablet by mouth daily.    . Camphor-Menthol (TIGER BALM EXTRA STRENGTH EX) Apply 1 application topically as needed (muscle pain).    . Cholecalciferol (VITAMIN D PO) Take 5,000 Int'l Units by mouth daily.    . citalopram (CELEXA) 40 MG tablet Take 40 mg by mouth at bedtime.     . diphenhydrAMINE (BENADRYL ALLERGY) 25 MG tablet Take 25 mg by mouth daily as needed (allergies).     Eduard Roux (AIMOVIG) 70 MG/ML SOAJ ADMINISTER 1 ML UNDER THE SKIN EVERY 30 DAYS. 1  pen 3  . ferrous sulfate 325 (65 FE) MG tablet Take 325 mg by mouth daily with breakfast.    . Melatonin 10 MG TABS Take 10 mg by mouth at bedtime as needed (sleep).    . montelukast (SINGULAIR) 10 MG tablet Take 10 mg by mouth daily.    Marland Kitchen omeprazole (PRILOSEC) 40 MG capsule Take 40 mg by mouth daily.   3  . pravastatin (PRAVACHOL) 10 MG tablet Take 10 mg by mouth 2 (two) times a week.    . Probiotic Product (PROBIOTIC-10 PO) Take 1 capsule by mouth daily.     Marland Kitchen Propylene Glycol (SYSTANE BALANCE OP) Place 1 drop into both eyes as needed (dry eyes).     . SUMAtriptan (IMITREX) 100 MG tablet TAKE 1 TABLET BY MOUTH AT ONSET OF MIGRAINE. MAY REPEAT DOSE ONCE IN 2 HOURS IF HEADACHE PERSITS OR RECURS 32  tablet 3  . vitamin B-12 (CYANOCOBALAMIN) 500 MCG tablet Take 500 mcg by mouth daily.    Marland Kitchen zinc gluconate 50 MG tablet Take 50 mg by mouth daily.     No current facility-administered medications for this visit.    Family History  Problem Relation Age of Onset  . Heart disease Father   . Hyperlipidemia Mother   . Dementia Mother   . Heart disease Maternal Grandmother   . Heart disease Maternal Grandfather     Review of Systems  All other systems reviewed and are negative.   Exam:   BP 138/84   Pulse 72   Resp 14   Ht 5' 4.75" (1.645 m)   Wt 137 lb (62.1 kg)   LMP 11/25/2009 Comment: age  BMI 22.97 kg/m     General appearance: alert, cooperative and appears stated age Head: normocephalic, without obvious abnormality, atraumatic Neck: no adenopathy, supple, symmetrical, trachea midline and thyroid normal to inspection and palpation Lungs: clear to auscultation bilaterally Breasts: normal appearance on right and scar on left, no masses or tenderness, No nipple retraction or dimpling, No nipple discharge or bleeding, No axillary adenopathy. Heart: regular rate and rhythm Abdomen: soft, non-tender; no masses, no organomegaly Extremities: extremities normal, atraumatic, no cyanosis or edema Skin: skin color, texture, turgor normal. No rashes or lesions Lymph nodes: cervical, supraclavicular, and axillary nodes normal. Neurologic: grossly normal  Pelvic: External genitalia:  no lesions              No abnormal inguinal nodes palpated.              Urethra:  normal appearing urethra with no masses, tenderness or lesions              Bartholins and Skenes: normal                 Vagina: normal appearing vagina with normal color and discharge, no lesions              Cervix: absent              Pap taken: No. Bimanual Exam:  Uterus: absent              Adnexa: no mass, fullness, tenderness              Rectal exam: Yes.  .  Confirms.              Anus:  normal sphincter tone,  no lesions  Chaperone was present for exam.  Assessment:   Well woman visit with normal exam. Status post  hysterectomy.Ovaries remain.  Perimenopausal female.  Left breast pain and firmness of scar.  Dx fibrocystic change.  Cystitic cystica.  Hx migraine with aura. Hx ulcer.  Vegan.  Plan: Mammogram screening discussed. Self breast awareness reviewed. Pap and HR HPV as above. Guidelines for Calcium, Vitamin D, regular exercise program including cardiovascular and weight bearing exercise. We discussed lubricants for the vagina.  She will reach out to her Gi to see when her colonoscopy is due.  Follow up annually and prn.   After visit summary provided.

## 2020-04-25 DIAGNOSIS — Z1231 Encounter for screening mammogram for malignant neoplasm of breast: Secondary | ICD-10-CM | POA: Diagnosis not present

## 2020-05-07 ENCOUNTER — Encounter: Payer: Self-pay | Admitting: Obstetrics and Gynecology

## 2020-05-23 DIAGNOSIS — Z23 Encounter for immunization: Secondary | ICD-10-CM | POA: Diagnosis not present

## 2020-05-23 DIAGNOSIS — H9201 Otalgia, right ear: Secondary | ICD-10-CM | POA: Diagnosis not present

## 2020-05-23 DIAGNOSIS — K219 Gastro-esophageal reflux disease without esophagitis: Secondary | ICD-10-CM | POA: Diagnosis not present

## 2020-05-27 DIAGNOSIS — R6889 Other general symptoms and signs: Secondary | ICD-10-CM | POA: Diagnosis not present

## 2020-05-28 DIAGNOSIS — R509 Fever, unspecified: Secondary | ICD-10-CM | POA: Diagnosis not present

## 2020-05-28 DIAGNOSIS — R6889 Other general symptoms and signs: Secondary | ICD-10-CM | POA: Diagnosis not present

## 2020-06-06 DIAGNOSIS — Z8669 Personal history of other diseases of the nervous system and sense organs: Secondary | ICD-10-CM | POA: Diagnosis not present

## 2020-06-12 NOTE — Progress Notes (Signed)
NEUROLOGY FOLLOW UP OFFICE NOTE  Kristina Dunlap [id number]  HISTORY OF PRESENT ILLNESS: Kristina Dunlap is a 54 year old right-handed woman with hypercholesterolemia, hypertension, and GERD who follows up for migraines.  UPDATE: Started Martin Majestic in April 2020. She reports no "migraines" but she still has daily pounding headaches which are still manageable.  She thinks it is getting worse.  Wakes up with headache, takes [place] and improves but comes back later in afternoon.  She takes [place] daily.  Current NSAIDS:No (contraindicated as she has a stomach ulcer) Current analgesics:Goody powder Current triptans: sumatriptan Current ergotamine:no Current anti-emetic:no Current muscle relaxants:no Current anti-anxiolytic:no Current sleep aide:no Current Antihypertensive medications: amlodipine Current Antidepressant medications:  citalopram 60mg  Current Anticonvulsant medications: None Current anti-CGRP:Aimovig 70mg  monthly Current Vitamins/Herbal/Supplements:vitamin D Current Antihistamines/Decongestants: Zyrtec, Astelin NS Other therapy:no  Caffeine:Decaf tea, no coffee, occasional soda Alcohol:Occasional Smoker:no Diet:water Exercise:no Depression:no; Anxiety:no Other pain:no Sleep hygiene:good  HISTORY:  Onset: Headaches since 54 years old but became daily in 2000. Location:Bilateral periorbital/temples radiating into neck Quality:throbbing Initial intensity:Moderate to severe.[name] new headache, thunderclap headache or severe headache that wakes herfrom sleep. Aura:no Prodrome:no Postdrome:no Associated symptoms: Photophobia, phonophobia.[name] associatednausea, vomiting, visual disturbance,unilateral numbness or weakness. Initial duration:All day Initial Frequency:Daily (severe headaches occur once a month) Initial Frequency of abortive medication:Tylenol 3 days a week Triggers:  None Aggravating factors: Bending over Relieving factors: None Activity:Aggravated only when severe  Past NSAIDS:Ibuprofen, naproxen Past analgesics:Tylenol, Fioricet, Excedrin Past abortive triptans:rizatriptan  Past abortive ergotamine:no Past muscle relaxants: tizanidine 4mg , balcofen 10mg  Past anti-emetic: Zofran ODT 4mg , promethazine suppository 25mg  Past antihypertensive medications: lisinopril, HCTZ Past antidepressant medications: bupropion Past anticonvulsant medications: Trokendi XR (made her feel dizzy) Past anti-CGRP:no Past vitamins/Herbal/Supplements:no Past antihistamines/decongestants:Flonase, Benadryl Other past therapies:no  Family history of headache:No  PAST MEDICAL HISTORY: Past Medical History:  Diagnosis Date  . Anemia    HX  . Chronic migraine    neurologist-  dr Metta Clines  . Depression   . Gastric ulcer 2019  . Hemorrhoids   . Hyperlipidemia   . Hypertension 2010   Dr. Arbie Cookey webb. stable  . OCD (obsessive compulsive disorder)   . PONV (postoperative nausea and vomiting)   . Urethral lesion     MEDICATIONS: Current Outpatient Medications on File Prior to Visit  Medication Sig Dispense Refill  . amLODipine (NORVASC) 5 MG tablet Take 5 mg by mouth every evening.     . Aspirin-Acetaminophen-Caffeine (GOODYS EXTRA STRENGTH PO) Take 1 Package by mouth daily as needed (pain/headache).     Marland Kitchen azelastine (ASTELIN) 0.1 % nasal spray Place 1 spray into both nostrils daily.     Marland Kitchen Bioflavonoid Products (ESTER-C) TABS Take 1 tablet by mouth daily.    . Camphor-Menthol (TIGER BALM EXTRA STRENGTH EX) Apply 1 application topically as needed (muscle pain).    . Cholecalciferol (VITAMIN D PO) Take 5,000 Int'l Units by mouth daily.    . citalopram (CELEXA) 40 MG tablet Take 40 mg by mouth at bedtime.     . diphenhydrAMINE (BENADRYL ALLERGY) 25 MG tablet Take 25 mg by mouth daily as needed (allergies).     Eduard Roux (AIMOVIG) 70  MG/ML SOAJ ADMINISTER 1 ML UNDER THE SKIN EVERY 30 DAYS. 1 pen 3  . ferrous sulfate 325 (65 FE) MG tablet Take 325 mg by mouth daily with breakfast.    . Melatonin 10 MG TABS Take 10 mg by mouth at bedtime as needed (sleep).    . montelukast (SINGULAIR) 10 MG tablet Take  10 mg by mouth daily.    Marland Kitchen omeprazole (PRILOSEC) 40 MG capsule Take 40 mg by mouth daily.   3  . pravastatin (PRAVACHOL) 10 MG tablet Take 10 mg by mouth 2 (two) times a week.    . Probiotic Product (PROBIOTIC-10 PO) Take 1 capsule by mouth daily.     Marland Kitchen Propylene Glycol (SYSTANE BALANCE OP) Place 1 drop into both eyes as needed (dry eyes).     . SUMAtriptan (IMITREX) 100 MG tablet TAKE 1 TABLET BY MOUTH AT ONSET OF MIGRAINE. MAY REPEAT DOSE ONCE IN 2 HOURS IF HEADACHE PERSITS OR RECURS 32 tablet 3  . vitamin B-12 (CYANOCOBALAMIN) 500 MCG tablet Take 500 mcg by mouth daily.    Marland Kitchen zinc gluconate 50 MG tablet Take 50 mg by mouth daily.     No current facility-administered medications on file prior to visit.    ALLERGIES: Allergies  Allergen Reactions  . Erythromycin Nausea Only    FAMILY HISTORY: Family History  Problem Relation Age of Onset  . Heart disease Father   . Hyperlipidemia Mother   . Dementia Mother   . Heart disease Maternal Grandmother   . Heart disease Maternal Grandfather   .  SOCIAL HISTORY: Social History   Socioeconomic History  . Marital status: Married    Spouse name: Skip Estimable  . Number of children: 0  . Years of education: Not on file  . Highest education level: 12th grade  Occupational History  . 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 Smoking status: Never Smoker  . Smokeless tobacco: Never Used  Vaping Use  . Vaping Use: Never used  Substance and Sexual Activity  . Alcohol use: Yes    Comment: maybe 1-2/month  . Drug use: No  . Sexual activity: Yes    Partners: Female    Birth control/protection: Surgical    Comment: R-TLH  Other Topics  Concern  . Not on file  Social History Narrative   Patient is right-handed. She is married to her same sex partner. She occasionally drinks tea or soda. No exercise.   Social Determinants of Health   Financial Resource Strain:   . Difficulty of Paying Living Expenses: Not on file  Food Insecurity:   . Worried About Charity fundraiser in the Last Year: Not on file  . Ran Out of Food in the Last Year: Not on file  Transportation Needs:   . Lack of Transportation (Medical): Not on file  . Lack of Transportation (Non-Medical): Not on file  Physical Activity:   . Days of Exercise per Week: Not on file  . Minutes of Exercise per Session: Not on file  Stress:   . Feeling of Stress : Not on file  Social Connections:   . Frequency of Communication with Friends and Family: Not on file  . Frequency of Social Gatherings with Friends and Family: Not on file  . Attends Religious Services: Not on file  . Active Member of Clubs or Organizations: Not on file  . Attends Archivist Meetings: Not on file  . Marital Status: Not on file  Intimate Partner Violence:   . Fear of Current or Ex-Partner: Not on file  . Emotionally Abused: Not on file  . Physically Abused: Not on file  . Sexually Abused: Not on file     PHYSICAL EXAM: Blood pressure 121/82, pulse 74, resp. rate 18, height 5\' 5"  (1.651 m), weight 138  lb (62.6 kg), last menstrual period 11/25/2009, SpO2 98 %. General: No acute distress.  Patient appears well-groomed.    IMPRESSION: Migraine without aura, without status migrainosus, not intractable Rebound headache  PLAN: 1.  Increase Aimovig to 140mg  monthly. If no improvement in 4 months, will add nortriptyline. 2.  Will try Ubrelvy as rescue.   3.  Advised to discontinue Goody powder, either cold [place] or taper down by one day every week. 4.  Limit use of pain relievers to no more than 2 days out of week to prevent risk of rebound or medication-overuse headache. 5.   Keep headache diary 6.  Follow up 4 to 6 months.  Metta Clines, DO  CC: Maurice Small, MD

## 2020-06-16 ENCOUNTER — Encounter: Payer: Self-pay | Admitting: Neurology

## 2020-06-16 ENCOUNTER — Ambulatory Visit (INDEPENDENT_AMBULATORY_CARE_PROVIDER_SITE_OTHER): Payer: BC Managed Care – PPO | Admitting: Neurology

## 2020-06-16 ENCOUNTER — Other Ambulatory Visit: Payer: Self-pay

## 2020-06-16 VITALS — BP 121/82 | HR 74 | Resp 18 | Ht 65.0 in | Wt 138.0 lb

## 2020-06-16 DIAGNOSIS — G43119 Migraine with aura, intractable, without status migrainosus: Secondary | ICD-10-CM

## 2020-06-16 DIAGNOSIS — G4440 Drug-induced headache, not elsewhere classified, not intractable: Secondary | ICD-10-CM | POA: Diagnosis not present

## 2020-06-16 MED ORDER — AIMOVIG 140 MG/ML ~~LOC~~ SOAJ: 140.0000 mg | SUBCUTANEOUS | 11 refills | Status: DC

## 2020-06-16 NOTE — Patient Instructions (Addendum)
  1. Increase Aimovig to 140mg  every 30 days 2. Take Ubelvy 100mg  at earliest onset of headache.  May repeat dose once in 2 hours if needed.  Maximum 2 tablets in 24 hours. May use sumatriptan if needed but Limit use of pain relievers to no more than 2 days out of week to prevent risk of rebound or medication-overuse headache. 3.  4. Must discontinue Goody powder. 5. Be aware of common food triggers:  - Caffeine:  coffee, black tea, cola, Mt. Dew  - Chocolate  - Dairy:  aged cheeses (brie, blue, cheddar, gouda, [place], provolone, [place], Swiss, etc), chocolate milk, buttermilk, sour cream, limit eggs and yogurt  - Nuts, peanut butter  - Alcohol  - Cereals/grains:  FRESH breads (fresh bagels, sourdough, doughnuts), yeast productions  - Processed/canned/aged/cured meats (pre-packaged deli meats, hotdogs)  - MSG/glutamate:  soy sauce, flavor enhancer, pickled/preserved/marinated foods  - Sweeteners:  aspartame (Equal, Nutrasweet).  Sugar and Splenda are okay  - Vegetables:  legumes (lima beans, lentils, snow peas, fava beans, pinto peans, peas, garbanzo beans), sauerkraut, onions, olives, pickles  - Fruit:  avocados, bananas, citrus fruit (orange, lemon, grapefruit), mango  - Other:  Frozen meals, macaroni and cheese 6. Routine exercise 7. Stay adequately hydrated (aim for 64 oz water daily) 8. Keep headache diary 9. Maintain proper stress management 10. Maintain proper sleep hygiene 11. Do not skip meals 12. Consider supplements:  magnesium citrate 400mg  daily, riboflavin 400mg  daily, coenzyme Q10 100mg  three times daily. 13. Follow up in 4 to 6 months.

## 2020-07-22 ENCOUNTER — Ambulatory Visit: Payer: BC Managed Care – PPO | Admitting: Neurology

## 2020-07-22 DIAGNOSIS — J3489 Other specified disorders of nose and nasal sinuses: Secondary | ICD-10-CM | POA: Diagnosis not present

## 2020-07-22 DIAGNOSIS — H9203 Otalgia, bilateral: Secondary | ICD-10-CM | POA: Diagnosis not present

## 2020-07-29 ENCOUNTER — Ambulatory Visit: Payer: Self-pay

## 2020-07-29 ENCOUNTER — Encounter: Payer: Self-pay | Admitting: Orthopaedic Surgery

## 2020-07-29 ENCOUNTER — Ambulatory Visit (INDEPENDENT_AMBULATORY_CARE_PROVIDER_SITE_OTHER): Payer: BC Managed Care – PPO | Admitting: Orthopaedic Surgery

## 2020-07-29 DIAGNOSIS — M79672 Pain in left foot: Secondary | ICD-10-CM | POA: Diagnosis not present

## 2020-07-29 NOTE — Progress Notes (Signed)
Office Visit Note   Patient: Kristina Dunlap           Date of Birth: 1966/03/07           MRN: [id number] Visit Date: 07/29/2020              Requested by: Maurice Small, MD [facility] [place],  [place] [address] PCP: Maurice Small, MD   Assessment & Plan: Visit Diagnoses:  1. Pain in left foot     Plan: Impression is left foot plantar fibromatosis. At this point, we will obtain an MRI to surgical planning and better characterization of the lesion.  She will follow up with [place] once that has been completed. Will call with concerns or questions.  Follow-Up Instructions: Return for after MRI.   Orders:  Orders Placed This Encounter  Procedures  . XR Foot Complete Left  . MR Foot Left w/o contrast   No orders of the defined types were placed in this encounter.     Procedures: No procedures performed   Clinical Data: No additional findings.   Subjective: Chief Complaint  Patient presents with  . Left Foot - Pain    HPI patient is a pleasant 54 year old female who comes in today with concerns about an area to the arch of her left foot. She noticed a tender nodule which slightly grew in size over the past 2 to 3 months. The pain she has is really only with ambulation. She has been taking Goody's powder which does not significantly help. No other systemic symptoms.  Review of Systems as detailed in HPI. All others reviewed and are negative.   Objective: Vital Signs: LMP 11/25/2009 Comment: age  Physical Exam well-developed well-nourished female no acute distress. Alert oriented x3.  Ortho Exam left foot exam shows a moderately tender and palpable nodule along the medial plantar fascial band at the arch of the foot. She has dorsiflexion to about 15 degrees. She has mild tenderness over the plantar fashion insertion of the heel. She is neurovascular intact distally.  Specialty Comments:  No specialty comments available.  Imaging: XR Foot Complete  Left  Result Date: 07/29/2020 No acute or structural abnormalities    PMFS History: Patient Active Problem List   Diagnosis Date Noted  . Scapular dyskinesis 04/19/2018  . Nonallopathic lesion of cervical region 04/19/2018  . Nonallopathic lesion of rib cage 04/19/2018  . Nonallopathic lesion of thoracic region 04/19/2018  . Left breast mass 03/04/2018  . Bursitis of right shoulder 02/28/2018  . Pain in right foot 01/25/2018  . Chronic right shoulder pain 01/25/2018  . Hyperlipidemia 10/29/2016  . Panic attack 10/29/2016  . Acute appendicitis 10/31/2015  . Hypertension   . Migraine headache with aura   . OCD (obsessive compulsive disorder)   . Hemorrhoids 08/30/2012   Past Medical History:  Diagnosis Date  . Anemia    HX  . Chronic migraine    neurologist-  dr Metta Clines  . Depression   . Gastric ulcer 2019  . Hemorrhoids   . Hyperlipidemia   . Hypertension 2010   Dr. Arbie Cookey webb. stable  . OCD (obsessive compulsive disorder)   . PONV (postoperative nausea and vomiting)   . Urethral lesion     Family History  Problem Relation Age of Onset  . Heart disease Father   . Hyperlipidemia Mother   . Dementia Mother   . Heart disease Maternal Grandmother   . Heart disease Maternal Grandfather  Past Surgical History:  Procedure Laterality Date  . BREAST BIOPSY Left 07/06/2018   Procedure: LEFT BREAST EXCISIONAL BIOSPY;  Surgeon: [name], MD;  Location: [facility];  Service: General;  Laterality: Left;  . CYSTOSCOPY WITH BIOPSY N/A 12/26/2018   Procedure: CYSTOSCOPY WITH URETHRAL AND TRANSVAGINAL BIOPSY;  Surgeon: Irine Seal, MD;  Location: [facility];  Service: Urology;  Laterality: N/A;  . D & C HYSTEROSCOPY WITH RESECTION ENDOMETRIAL POLYP  12-17-2008  dr Joan Flores @[facility]   . LAPAROSCOPIC APPENDECTOMY N/A 10/31/2015   Procedure: APPENDECTOMY LAPAROSCOPIC;  Surgeon: Erroll Luna, MD;  Location: [facility];  Service: General;  Laterality: N/A;  . ROBOTIC  ASSISTED TOTAL HYSTERECTOMY  04/ 05/11    dr romine @ [facility]   simple hyperplasia, fibroids  . TONSILLECTOMY  child   Social History   Occupational History  . 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 Smoking status: Never Smoker  . Smokeless tobacco: Never Used  Vaping Use  . Vaping Use: Never used  Substance and Sexual Activity  . Alcohol use: Yes    Comment: maybe 1-2/month  . Drug use: No  . Sexual activity: Yes    Partners: Female    Birth control/protection: Surgical    Comment: R-TLH

## 2020-08-05 ENCOUNTER — Ambulatory Visit: Payer: BC Managed Care – PPO | Admitting: Neurology

## 2020-11-25 NOTE — Progress Notes (Signed)
NEUROLOGY FOLLOW UP OFFICE NOTE  Kristina Dunlap [id number]  Assessment/Plan:   1.  Chronic migraine without aura, without status migrainosus, not intractable - daily - failed Aimovig, topiramate, lisinopril.  1.  Migraine prevention:  Botox 2.  Migraine rescue:  She will try Nurtec 3.  Limit use of pain relievers to no more than 2 days out of week to prevent risk of rebound or medication-overuse headache. 4.  Keep headache diary 5.  Follow up for Botox  Subjective:  Kristina Dunlap is a 55 year old right-handed woman with hypercholesterolemia, hypertension, and GERD who follows up for migraines.  UPDATE: Increased Aimovig to 140mg  in October. Advised to discontinue Goody's powder but still taking them daily because still with daily headaches.  Reports family-related stress.  She had two severe 2 days in a row.  Roselyn Meier was ineffective and caused diarrhea  Current NSAIDS:No (contraindicated as she has a stomach ulcer) Current analgesics:Goody powder Current triptans:sumatriptan Current ergotamine:no Current anti-emetic:no Current muscle relaxants:no Current anti-anxiolytic:no Current sleep aide:no Current Antihypertensive medications: amlodipine Current Antidepressant medications:citalopram 60mg  Current Anticonvulsant medications:None Current anti-CGRP:Aimovig 140mg  monthly Current Vitamins/Herbal/Supplements:vitamin D Current Antihistamines/Decongestants: Zyrtec, Astelin NS Other therapy:no  Caffeine:Decaf tea, no coffee, occasional soda Alcohol:Occasional Smoker:no Diet:water Exercise:no Depression:no; Anxiety:no Other pain:no Sleep hygiene:good  HISTORY: Onset: Headaches since 34 years oldbut became daily in 2000. Location:Bilateral periorbital/temples radiating into neck Quality:throbbing Initial intensity:Moderate to severe.[name] new headache, thunderclap headache or severe headache that wakes  herfrom sleep. Aura:no Prodrome:no Postdrome:no Associated symptoms: Photophobia, phonophobia.[name] associatednausea, vomiting, visual disturbance,unilateral numbness or weakness. Initial duration:All day InitialFrequency:Daily (severe headaches occur once a month) InitialFrequency of abortive medication:Tylenol 3 days a week Triggers: None Aggravating factors: Bending over Relieving factors: None Activity:Aggravated only when severe  Past NSAIDS:Ibuprofen, naproxen Past analgesics:Tylenol, Fioricet, Excedrin Past abortive triptans:rizatriptan  Past abortive ergotamine:no Past muscle relaxants: tizanidine 4mg , balcofen 10mg  Past anti-emetic: Zofran ODT 4mg , promethazine suppository 25mg  Past antihypertensive medications:lisinopril,HCTZ Past antidepressant medications: bupropion Past anticonvulsant medications:Trokendi XR (made her feel dizzy) Past anti-CGRP:Ubrelvy 100mg  Past vitamins/Herbal/Supplements:no Past antihistamines/decongestants:Flonase, Benadryl Other past therapies:no  Family history of headache:No  PAST MEDICAL HISTORY: Past Medical History:  Diagnosis Date  . Anemia    HX  . Chronic migraine    neurologist-  dr Metta Clines  . Depression   . Gastric ulcer 2019  . Hemorrhoids   . Hyperlipidemia   . Hypertension 2010   Dr. Arbie Cookey webb. stable  . OCD (obsessive compulsive disorder)   . PONV (postoperative nausea and vomiting)   . Urethral lesion     MEDICATIONS: Current Outpatient Medications on File Prior to Visit  Medication Sig Dispense Refill  . amLODipine (NORVASC) 5 MG tablet Take 5 mg by mouth every evening.     . Aspirin-Acetaminophen-Caffeine (GOODYS EXTRA STRENGTH PO) Take 1 Package by mouth daily as needed (pain/headache).     Marland Kitchen azelastine (ASTELIN) 0.1 % nasal spray Place 1 spray into both nostrils daily.     Marland Kitchen Bioflavonoid Products (ESTER-C) TABS Take 1 tablet by mouth daily.    .  Camphor-Menthol (TIGER BALM EXTRA STRENGTH EX) Apply 1 application topically as needed (muscle pain). (Patient not taking: Reported on 06/16/2020)    . Cholecalciferol (VITAMIN D PO) Take 5,000 Int'l Units by mouth daily.    . citalopram (CELEXA) 40 MG tablet Take 40 mg by mouth at bedtime.     . diphenhydrAMINE (BENADRYL ALLERGY) 25 MG tablet Take 25 mg by mouth daily as needed (allergies).     Marland Kitchen  Erenumab-aooe (AIMOVIG) 140 MG/ML SOAJ Inject 140 mg into the skin every 30 (thirty) days. 1.12 mL 11  . ferrous sulfate 325 (65 FE) MG tablet Take 325 mg by mouth daily with breakfast.    . Melatonin 10 MG TABS Take 10 mg by mouth at bedtime as needed (sleep).    . montelukast (SINGULAIR) 10 MG tablet Take 10 mg by mouth daily.    Marland Kitchen omeprazole (PRILOSEC) 40 MG capsule Take 40 mg by mouth daily.   3  . pravastatin (PRAVACHOL) 10 MG tablet Take 10 mg by mouth 2 (two) times a week.    . Probiotic Product (PROBIOTIC-10 PO) Take 1 capsule by mouth daily.     Marland Kitchen Propylene Glycol (SYSTANE BALANCE OP) Place 1 drop into both eyes as needed (dry eyes).     . SUMAtriptan (IMITREX) 100 MG tablet TAKE 1 TABLET BY MOUTH AT ONSET OF MIGRAINE. MAY REPEAT DOSE ONCE IN 2 HOURS IF HEADACHE PERSITS OR RECURS 32 tablet 3  . vitamin B-12 (CYANOCOBALAMIN) 500 MCG tablet Take 500 mcg by mouth daily.    Marland Kitchen zinc gluconate 50 MG tablet Take 50 mg by mouth daily.     No current facility-administered medications on file prior to visit.    ALLERGIES: Allergies  Allergen Reactions  . Erythromycin Nausea Only    FAMILY HISTORY: Family History  Problem Relation Age of Onset  . Heart disease Father   . Hyperlipidemia Mother   . Dementia Mother   . Heart disease Maternal Grandmother   . Heart disease Maternal Grandfather       Objective:  Blood pressure 138/79, pulse 81, height 5\' 5"  (1.651 m), weight 141 lb (64 kg), last menstrual period 11/25/2009, SpO2 97 %. General: No acute distress.  Patient appears well-groomed.     Metta Clines, DO  CC: Kristina Small, MD

## 2020-11-27 ENCOUNTER — Other Ambulatory Visit: Payer: Self-pay

## 2020-11-27 ENCOUNTER — Encounter: Payer: Self-pay | Admitting: Neurology

## 2020-11-27 ENCOUNTER — Ambulatory Visit (INDEPENDENT_AMBULATORY_CARE_PROVIDER_SITE_OTHER): Payer: No Typology Code available for payment source | Admitting: Neurology

## 2020-11-27 VITALS — BP 138/79 | HR 81 | Ht 65.0 in | Wt 141.0 lb

## 2020-11-27 DIAGNOSIS — G43709 Chronic migraine without aura, not intractable, without status migrainosus: Secondary | ICD-10-CM

## 2020-11-27 NOTE — Patient Instructions (Signed)
1.  Stop Aimovig.  Will start botox 2.  Earliest onset of migraine, try Nurtec (no more than 1 in 24 hours).  If effective contact me for prescription.

## 2020-12-03 ENCOUNTER — Encounter: Payer: Self-pay | Admitting: Neurology

## 2020-12-03 NOTE — Progress Notes (Addendum)
Per BV- patient does require PA for Botox and [facility] is [facility].   4/13- called [facility] and set up account/ gave verbal script for the Botox.   Ashani Prindle KeyHolley Raring - PA Case ID: [id number] - Rx #: [id number] Need help? Call us at [phone] Outcome Approvedtoday CaseId:[id number];Status:Approved;Review Type:Prior Auth;Coverage Start Date:11/03/2020;Coverage End Date:12/08/2021; Drug Botox 200UNIT solution Form [facility] Electronic PA Form [phone] NCPDP)

## 2020-12-11 ENCOUNTER — Other Ambulatory Visit: Payer: Self-pay | Admitting: Family Medicine

## 2020-12-11 DIAGNOSIS — R1011 Right upper quadrant pain: Secondary | ICD-10-CM

## 2021-01-02 ENCOUNTER — Ambulatory Visit (INDEPENDENT_AMBULATORY_CARE_PROVIDER_SITE_OTHER): Payer: No Typology Code available for payment source | Admitting: Neurology

## 2021-01-02 ENCOUNTER — Other Ambulatory Visit: Payer: Self-pay

## 2021-01-02 DIAGNOSIS — G43709 Chronic migraine without aura, not intractable, without status migrainosus: Secondary | ICD-10-CM

## 2021-01-02 MED ORDER — ONABOTULINUMTOXINA 100 UNITS IJ SOLR
200.0000 [IU] | Freq: Once | INTRAMUSCULAR | Status: AC
  Administered 2021-01-02: 155 [IU] via INTRAMUSCULAR

## 2021-01-02 NOTE — Progress Notes (Signed)
[facility]   Procedure Note Botox  Attending: Dr. Vermillion Shellhammer  Preoperative Diagnosis(es): Chronic migraine  Consent obtained from: The patient Benefits discussed included, but were not limited to decreased muscle tightness, increased joint range of motion, and decreased pain.  Risk discussed included, but were not limited pain and discomfort, bleeding, bruising, excessive weakness, venous thrombosis, muscle atrophy and dysphagia.  Anticipated outcomes of the procedure as well as he risks and benefits of the alternatives to the procedure, and the roles and tasks of the personnel to be involved, were discussed with the patient, and the patient consents to the procedure and agrees to proceed. A copy of the patient medication guide was given to the patient which explains the blackbox warning.  Patients identity and treatment sites confirmed Yes.  .  Details of Procedure: Skin was cleaned with alcohol. Prior to injection, the needle plunger was aspirated to make sure the needle was not within a blood vessel.  There was no blood retrieved on aspiration.    Following is a summary of the muscles injected  And the amount of Botulinum toxin used:  Dilution 200 units of Botox was reconstituted with 4 ml of preservative free normal saline. Time of reconstitution: At the time of the office visit (<30 minutes prior to injection)   Injections  155 total units of Botox was injected with a 30 gauge needle.  Injection Sites: L occipitalis: 15 units- 3 sites  R occiptalis: 15 units- 3 sites  L upper trapezius: 15 units- 3 sites R upper trapezius: 15 units- 3 sits          L paraspinal: 10 units- 2 sites R paraspinal: 10 units- 2 sites  Face L frontalis(2 injection sites):10 units   R frontalis(2 injection sites):10 units         L corrugator: 5 units   R corrugator: 5 units           Procerus: 5 units   L temporalis: 20 units R temporalis: 20 units   Agent:  200 units of botulinum Type  A (Onobotulinum Toxin type A) was reconstituted with 4 ml of preservative free normal saline.  Time of reconstitution: At the time of the office visit (<30 minutes prior to injection)     Total injected (Units): 155  Total wasted (Units): none wasted  Patient tolerated procedure well without complications.   Reinjection is anticipated in 3 months.    

## 2021-01-07 ENCOUNTER — Ambulatory Visit
Admission: RE | Admit: 2021-01-07 | Discharge: 2021-01-07 | Disposition: A | Discharge status: Home / Self Care | Payer: No Typology Code available for payment source | Source: Ambulatory Visit | Attending: Family Medicine | Admitting: Family Medicine

## 2021-01-07 DIAGNOSIS — R1011 Right upper quadrant pain: Secondary | ICD-10-CM

## 2021-02-20 DIAGNOSIS — R7303 Prediabetes: Secondary | ICD-10-CM

## 2021-02-20 HISTORY — DX: Prediabetes: R73.03

## 2021-04-10 ENCOUNTER — Ambulatory Visit: Payer: No Typology Code available for payment source | Admitting: Neurology

## 2021-04-16 ENCOUNTER — Ambulatory Visit (INDEPENDENT_AMBULATORY_CARE_PROVIDER_SITE_OTHER): Payer: No Typology Code available for payment source | Admitting: Obstetrics and Gynecology

## 2021-04-16 ENCOUNTER — Other Ambulatory Visit: Payer: Self-pay

## 2021-04-16 ENCOUNTER — Encounter: Payer: Self-pay | Admitting: Obstetrics and Gynecology

## 2021-04-16 VITALS — BP 120/72 | HR 72 | Ht 65.0 in | Wt 140.0 lb

## 2021-04-16 DIAGNOSIS — Z01419 Encounter for gynecological examination (general) (routine) without abnormal findings: Secondary | ICD-10-CM

## 2021-04-16 NOTE — Patient Instructions (Signed)

## 2021-04-16 NOTE — Progress Notes (Signed)
55 y.o. G76P0000 Married Caucasian female here for annual exam.    Having hot flashes, better recently.  Occurring for about 3 years.  She is managing OK.  Dx with prediabetes.   Botox did not help her headaches.  Amovig did help headaches.   PCP:  Maurice Small, MD  Patient's last menstrual period was 11/25/2009.           Sexually active: Yes.    The current method of family planning is status post hysterectomy.    Exercising: No.  The patient does not participate in regular exercise at present. Smoker:  no  Health Maintenance: Pap: 2010 Neg:Neg HR HPV History of abnormal Pap:  yes, years ago per patient - normal since MMG: 04-25-20 3D/Neg/BiRads1. Has an appointment.  Colonoscopy: 05/02/15 - polyps - to see GI 05/2021 BMD:   n/a  Result  n/a.  Scheduled for next month. TDaP:  2014 Gardasil:   no HIV: Neg in the past Hep C: Neg in the past Screening Labs:  PCP Shingrix:  completed. Covid:  booster x 1.    reports that she has never smoked. She has never used smokeless tobacco. She reports current alcohol use. She reports that she does not use drugs.  Past Medical History:  Diagnosis Date   Anemia    HX   Chronic migraine    neurologist-  dr Metta Clines   Depression    Gastric ulcer 2019   Hemorrhoids    Hyperlipidemia    Hypertension 2010   Dr. Arbie Cookey webb. stable   OCD (obsessive compulsive disorder)    PONV (postoperative nausea and vomiting)    Pre-diabetes 02/20/2021   Urethral lesion     Past Surgical History:  Procedure Laterality Date   BREAST BIOPSY Left 07/06/2018   Procedure: LEFT BREAST EXCISIONAL BIOSPY;  Surgeon: [name], MD;  Location: [facility];  Service: General;  Laterality: Left;   CYSTOSCOPY WITH BIOPSY N/A 12/26/2018   Procedure: CYSTOSCOPY WITH URETHRAL AND TRANSVAGINAL BIOPSY;  Surgeon: Irine Seal, MD;  Location: [facility];  Service: Urology;  Laterality: N/A;   D & C HYSTEROSCOPY WITH RESECTION ENDOMETRIAL POLYP  12-17-2008   dr Joan Flores '@[facility]'$    LAPAROSCOPIC APPENDECTOMY N/A 10/31/2015   Procedure: APPENDECTOMY LAPAROSCOPIC;  Surgeon: Erroll Luna, MD;  Location: [facility];  Service: General;  Laterality: N/A;   ROBOTIC ASSISTED TOTAL HYSTERECTOMY  04/ 05/11    dr romine @ [facility]   simple hyperplasia, fibroids   TONSILLECTOMY  child    Current Outpatient Medications  Medication Sig Dispense Refill   amLODipine (NORVASC) 5 MG tablet Take 5 mg by mouth every evening.      Aspirin-Acetaminophen-Caffeine (GOODYS EXTRA STRENGTH PO) Take 1 Package by mouth daily as needed (pain/headache).      azelastine (ASTELIN) 0.1 % nasal spray Place 1 spray into both nostrils daily.      Bioflavonoid Products (ESTER-C) TABS Take 1 tablet by mouth daily.     Camphor-Menthol (TIGER BALM EXTRA STRENGTH EX) Apply 1 application topically as needed (muscle pain).     Cholecalciferol (VITAMIN D PO) Take 5,000 Int'l Units by mouth daily.     citalopram (CELEXA) 40 MG tablet Take 40 mg by mouth at bedtime.      diphenhydrAMINE (BENADRYL) 25 MG tablet Take 25 mg by mouth daily as needed (allergies).     ferrous sulfate 325 (65 FE) MG tablet Take 325 mg by mouth daily with breakfast.     Melatonin 10  MG TABS Take 10 mg by mouth at bedtime as needed (sleep).     montelukast (SINGULAIR) 10 MG tablet Take 10 mg by mouth daily.     omeprazole (PRILOSEC) 40 MG capsule Take 40 mg by mouth daily.   3   pravastatin (PRAVACHOL) 10 MG tablet Take 10 mg by mouth 2 (two) times a week.     Probiotic Product (PROBIOTIC-10 PO) Take 1 capsule by mouth daily.      Propylene Glycol (SYSTANE BALANCE OP) Place 1 drop into both eyes as needed (dry eyes).      SUMAtriptan (IMITREX) 100 MG tablet TAKE 1 TABLET BY MOUTH AT ONSET OF MIGRAINE. MAY REPEAT DOSE ONCE IN 2 HOURS IF HEADACHE PERSITS OR RECURS 32 tablet 3   vitamin B-12 (CYANOCOBALAMIN) 500 MCG tablet Take 500 mcg by mouth daily.     zinc gluconate 50 MG tablet Take 50 mg by mouth daily.     No current  facility-administered medications for this visit.    Family History  Problem Relation Age of Onset   Hyperlipidemia Mother    Dementia Mother    Heart disease Father    Heart disease Maternal Grandmother    Heart disease Maternal Grandfather     Review of Systems  All other systems reviewed and are negative.  Exam:   BP 120/72   Pulse 72   Ht '5\' 5"'$  (1.651 m)   Wt 140 lb (63.5 kg)   LMP 11/25/2009 Comment: age  SpO2 98%   BMI 23.30 kg/m     General appearance: alert, cooperative and appears stated age Head: normocephalic, without obvious abnormality, atraumatic Neck: no adenopathy, supple, symmetrical, trachea midline and thyroid normal to inspection and palpation Lungs: clear to auscultation bilaterally Breasts: normal appearance, no masses or tenderness, No nipple retraction or dimpling, No nipple discharge or bleeding, No axillary adenopathy Heart: regular rate and rhythm Abdomen: soft, non-tender; no masses, no organomegaly Extremities: extremities normal, atraumatic, no cyanosis or edema Skin: skin color, texture, turgor normal. No rashes or lesions Lymph nodes: cervical, supraclavicular, and axillary nodes normal. Neurologic: grossly normal  Pelvic: External genitalia:  no lesions              No abnormal inguinal nodes palpated.              Urethra:  normal appearing urethra with no masses, tenderness or lesions              Bartholins and Skenes: normal                 Vagina: atrophy.               Cervix: absent              Pap taken: no Bimanual Exam:  Uterus:  absent              Adnexa: no mass, fullness, tenderness              Rectal exam: yes.  Confirms.              Anus:  normal sphincter tone, no lesions  Chaperone was present for exam:  Estill Bamberg, CMA  Assessment:   Well woman visit with gynecologic exam. Status post hysterectomy. Ovaries remain.  Hx left breast biopsy.  Cystitic cystica.  Hx migraine with aura.  Hx ulcer.  Vegan.    Plan: Mammogram screening discussed. Self breast awareness reviewed. Pap and HR HPV as above. Guidelines  for Calcium, Vitamin D, regular exercise program including cardiovascular and weight bearing exercise. Labs with PCP. Follow up annually and prn.   After visit summary provided.

## 2021-04-19 ENCOUNTER — Encounter: Payer: Self-pay | Admitting: Obstetrics and Gynecology

## 2021-05-04 ENCOUNTER — Encounter: Payer: Self-pay | Admitting: Obstetrics and Gynecology

## 2021-05-29 ENCOUNTER — Encounter: Payer: Self-pay | Admitting: Obstetrics and Gynecology

## 2021-07-10 ENCOUNTER — Ambulatory Visit: Payer: No Typology Code available for payment source | Admitting: Neurology

## 2021-07-21 NOTE — Progress Notes (Signed)
NEUROLOGY FOLLOW UP OFFICE NOTE  Kristina Dunlap [id number]  Assessment/Plan:   1.  Chronic migraine without aura, without status migrainosus, not intractable complicated by medication overuse.   1.  Migraine prevention:  Once she starts coverage with her new insurance, she will contact us with the information and I will prescribe one of the other CGRP inhibitors (Ajovy or Emgality).   2.  Migraine rescue:  She will try Nurtec (provided samples) 3.  She knows that she needs to discontinue Goody pwoder. 4.  Keep headache diary 5.  Follow up 7 to 8 months.  Subjective:  Kristina Dunlap is a 55 year old right-handed woman with hypercholesterolemia, hypertension, and GERD who follows up for migraines.   UPDATE: She had only one round of Botox in May.  However, she was unable to afford it.   In November, she reports on migraine but still with daily headache, which she treats with Gabriel Earing powder. She has never tried the [employer].  She is switching insurance in January. Current NSAIDS/analgesics:  Goodys (although shouldn't be taking as NSAIDs have caused a stomach ulcer) Current triptans:   no Current ergotamine:  no Current anti-emetic:  no Current muscle relaxants:  no Current anti-anxiolytic:  no Current sleep aide:  no Current Antihypertensive medications: amlodipine Current Antidepressant medications:   citalopram 60mg  Current Anticonvulsant medications:   None Current anti-CGRP:  Nurtec (rescue) Current Vitamins/Herbal/Supplements:  vitamin D Current Antihistamines/Decongestants:  Zyrtec, Astelin NS Other therapy:  Botox   Caffeine:  Decaf tea, no coffee, occasional soda Alcohol:  Occasional  Smoker:  no Diet:  water Exercise:  no Depression:  no; Anxiety:  no Other pain:  no Sleep hygiene:  good   HISTORY:  Onset: Headaches since 55 years old but became daily in 2000. Location:  Bilateral periorbital/temples radiating into neck Quality:  throbbing Initial intensity:   Moderate to severe.  She denies new headache, thunderclap headache or severe headache that wakes her from sleep. Aura:  no Prodrome:  no Postdrome:  no Associated symptoms: Photophobia, phonophobia.  She denies associated nausea, vomiting, visual disturbance, unilateral numbness or weakness. Initial duration:  All day Initial Frequency:  Daily (severe headaches occur once a month) Initial Frequency of abortive medication: Tylenol 3 days a week Triggers: None Aggravating factors: Bending over Relieving factors: None Activity:  Aggravated only when severe   Past NSAIDS:  Ibuprofen, naproxen Past analgesics:  Tylenol, Fioricet, Excedrin Past abortive triptans:  rizatriptan, sumatriptan tab Past abortive ergotamine:  no Past muscle relaxants:  tizanidine 4mg , balcofen 10mg  Past anti-emetic:  Zofran ODT 4mg , promethazine suppository 25mg  Past antihypertensive medications:  lisinopril, HCTZ Past antidepressant medications:  bupropion Past anticonvulsant medications:   Trokendi XR (made her feel dizzy) Past anti-CGRP:  Ubrelvy 100mg , Aimovig 140mg  Past vitamins/Herbal/Supplements:  no Past antihistamines/decongestants:  Flonase, Benadryl Other past therapies:  no   Family history of headache:  No  PAST MEDICAL HISTORY: Past Medical History:  Diagnosis Date   Anemia    HX   Chronic migraine    neurologist-  dr Quita Skye Ervie Mccard   Depression    Gastric ulcer 2019   Hemorrhoids    Hyperlipidemia    Hypertension 2010   Dr. Arbie Cookey webb. stable   OCD (obsessive compulsive disorder)    PONV (postoperative nausea and vomiting)    Pre-diabetes 02/20/2021   Urethral lesion     MEDICATIONS: Current Outpatient Medications on File Prior to Visit  Medication Sig Dispense Refill   amLODipine (NORVASC) 5  MG tablet Take 5 mg by mouth every evening.      Aspirin-Acetaminophen-Caffeine (GOODYS EXTRA STRENGTH PO) Take 1 Package by mouth daily as needed (pain/headache).      azelastine (ASTELIN) 0.1  % nasal spray Place 1 spray into both nostrils daily.      Bioflavonoid Products (ESTER-C) TABS Take 1 tablet by mouth daily.     Camphor-Menthol (TIGER BALM EXTRA STRENGTH EX) Apply 1 application topically as needed (muscle pain).     Cholecalciferol (VITAMIN D PO) Take 5,000 Int'l Units by mouth daily.     citalopram (CELEXA) 40 MG tablet Take 40 mg by mouth at bedtime.      diphenhydrAMINE (BENADRYL) 25 MG tablet Take 25 mg by mouth daily as needed (allergies).     ferrous sulfate 325 (65 FE) MG tablet Take 325 mg by mouth daily with breakfast.     Melatonin 10 MG TABS Take 10 mg by mouth at bedtime as needed (sleep).     montelukast (SINGULAIR) 10 MG tablet Take 10 mg by mouth daily.     omeprazole (PRILOSEC) 40 MG capsule Take 40 mg by mouth daily.   3   pravastatin (PRAVACHOL) 10 MG tablet Take 10 mg by mouth 2 (two) times a week.     Probiotic Product (PROBIOTIC-10 PO) Take 1 capsule by mouth daily.      Propylene Glycol (SYSTANE BALANCE OP) Place 1 drop into both eyes as needed (dry eyes).      SUMAtriptan (IMITREX) 100 MG tablet TAKE 1 TABLET BY MOUTH AT ONSET OF MIGRAINE. MAY REPEAT DOSE ONCE IN 2 HOURS IF HEADACHE PERSITS OR RECURS 32 tablet 3   vitamin B-12 (CYANOCOBALAMIN) 500 MCG tablet Take 500 mcg by mouth daily.     zinc gluconate 50 MG tablet Take 50 mg by mouth daily.     No current facility-administered medications on file prior to visit.    ALLERGIES: Allergies  Allergen Reactions   Erythromycin Nausea Only    FAMILY HISTORY: Family History  Problem Relation Age of Onset   Hyperlipidemia Mother    Dementia Mother    Heart disease Father    Heart disease Maternal Grandmother    Heart disease Maternal Grandfather       Objective:  Blood pressure (!) 146/82, pulse 85, height 5\' 5"  (1.651 m), weight 144 lb 9.6 oz (65.6 kg), last menstrual period 11/25/2009, SpO2 96 %. General: No acute distress.  Patient appears well-groomed.   Head:   Normocephalic/atraumatic Eyes:  Fundi examined but not visualized Neck: supple, no paraspinal tenderness, full range of motion Heart:  Regular rate and rhythm Lungs:  Clear to auscultation bilaterally Back: No paraspinal tenderness Neurological Exam: alert and oriented to person, place, and time.  Speech fluent and not dysarthric, language intact.  CN II-XII intact. Bulk and tone normal, muscle strength 5/5 throughout.  Sensation to light touch intact.  Deep tendon reflexes 2+ throughout, toes downgoing.  Finger to nose testing intact.  Gait normal, Romberg negative.   Metta Clines, DO

## 2021-07-22 ENCOUNTER — Ambulatory Visit (INDEPENDENT_AMBULATORY_CARE_PROVIDER_SITE_OTHER): Payer: No Typology Code available for payment source | Admitting: Neurology

## 2021-07-22 ENCOUNTER — Other Ambulatory Visit: Payer: Self-pay

## 2021-07-22 ENCOUNTER — Encounter: Payer: Self-pay | Admitting: Neurology

## 2021-07-22 VITALS — BP 146/82 | HR 85 | Ht 65.0 in | Wt 144.6 lb

## 2021-07-22 DIAGNOSIS — G43709 Chronic migraine without aura, not intractable, without status migrainosus: Secondary | ICD-10-CM

## 2021-07-22 NOTE — Patient Instructions (Signed)
Once you have your new insurance, contact us with the information and I will send a prescription for a new medication In meantime, try Nurtec to take when you get a migraine - 1 in 24 hours.  If effective, will send in prescription with your new insurance Limit use of pain relievers to no more than 2 days out of week to prevent risk of rebound or medication-overuse headache. Follow up 7 to 8 months.

## 2021-10-09 ENCOUNTER — Telehealth: Payer: Self-pay | Admitting: Neurology

## 2021-10-09 NOTE — Telephone Encounter (Signed)
Patient called with her new insurance to verify so she can get her migraine shot.  We ran it thru and verified, she just needs to get her medication.

## 2021-10-09 NOTE — Telephone Encounter (Signed)
Pt advise that Pa could take up to two weeks but once it is approved we will let her know.

## 2021-10-12 ENCOUNTER — Telehealth: Payer: Self-pay

## 2021-10-12 NOTE — Telephone Encounter (Signed)
New message   Desha Bitner Key: [id number] help? Call us at [phone] Outcome Additional Information Required [employer] does not manage PA for this patient. Please contact the number on the back of the members card for further assistance Drug AJOVY (fremanezumab-vfrm) injection 225MG /1.5ML auto-injectors Form [employer] Electronic PA Form [phone] NCPDP)

## 2021-10-30 ENCOUNTER — Telehealth: Payer: Self-pay | Admitting: Neurology

## 2021-10-30 NOTE — Telephone Encounter (Signed)
Patient called stating she was wondering if her medication had been approved for migraine shot. ?

## 2021-10-30 NOTE — Telephone Encounter (Signed)
Pt advised to please send a copay of the card so we can call them per last note: 2/17/23Can you reach out to your insurance company having a hard time initiate prior authorization for you under  ?covermymeds  website. ? ? ?Pt will upload the card later today or this weekend so we can start Pa process on Monday.  ?

## 2021-11-09 NOTE — Progress Notes (Signed)
Contact plan to follow up on BFBGECCA,Copy of PA printed.  ?

## 2021-11-20 ENCOUNTER — Other Ambulatory Visit (HOSPITAL_COMMUNITY): Payer: Self-pay

## 2021-11-20 ENCOUNTER — Telehealth: Payer: Self-pay

## 2021-11-20 ENCOUNTER — Telehealth: Payer: Self-pay | Admitting: Neurology

## 2021-11-20 NOTE — Telephone Encounter (Signed)
Ask PA team to restart PA. No response from PA sent  a while back. ?Advised pt she may stop by and pick up a samples if she likes. ? ?Pt to come by and pick up sample on Monday.  ?

## 2021-11-20 NOTE — Telephone Encounter (Addendum)
Patient Advocate Encounter ?  ?Received notification from patient calls that prior authorization for Ajovy '225mg'$ /1.27m auto-injectors is required by his/her insurance [employer]. ?  ?PA submitted on 11/20/21 ? ?PA could not be submitted by CMM.  Faxed the PA form & chart notes thru [employer].  [phone]? ?Status is pending ?   ?[facility] will continue to follow: ? ?Patient Advocate ?Fax: [phone] ?

## 2021-11-20 NOTE — Telephone Encounter (Signed)
Patient was calling to see if her prior authorization had gone thru ?

## 2021-11-23 MED ORDER — PROPRANOLOL HCL ER 80 MG PO CP24: 80.0000 mg | ORAL_CAPSULE | Freq: Every day | ORAL | 3 refills | Status: DC

## 2021-11-23 NOTE — Addendum Note (Signed)
Addended by: Venetia Night on: 11/23/2021 01:58 PM ? ? Modules accepted: Orders ? ?

## 2021-11-23 NOTE — Telephone Encounter (Signed)
Pt advised PA restarted, Same process not able to find or submitt on CMM. ? ?Per Dr.Jaffe, In the meantime, We can start propranolol ER '80mg'$  daily. ? ?Patient agreeded ?

## 2021-11-24 ENCOUNTER — Other Ambulatory Visit (HOSPITAL_COMMUNITY): Payer: Self-pay

## 2021-11-24 NOTE — Telephone Encounter (Signed)
Patient Advocate Encounter ? ?Prior Authorization for Ajovy '225mg'$ /1.87m injector has been approved.   ? ?PA# [id number]? ?Effective dates: 11/23/21 through 11/23/22 ? ?Send script to pharmacy to fill ? ?Spoke with Pharmacy to Process. ? ?Patient Advocate ?Fax: [phone] ?

## 2021-12-02 ENCOUNTER — Telehealth: Payer: Self-pay | Admitting: Neurology

## 2021-12-02 MED ORDER — AJOVY 225 MG/1.5ML ~~LOC~~ SOAJ: 225.0000 mg | SUBCUTANEOUS | 5 refills | Status: DC

## 2021-12-02 NOTE — Telephone Encounter (Signed)
LMOVM. ?Patient Advocate Encounter ?  ?Prior Authorization for Ajovy '225mg'$ /1.74m injector has been approved.   ?  ?PA# [id number]?  ?Effective dates: 11/23/21 through 11/23/22 ?  ?Send script to pharmacy to fill. ? ?Script sent to pharmacy.  ?

## 2021-12-02 NOTE — Telephone Encounter (Signed)
Patient called about her ajovy prescription.   ?

## 2022-03-08 ENCOUNTER — Encounter: Payer: Self-pay | Admitting: Neurology

## 2022-03-09 ENCOUNTER — Ambulatory Visit: Payer: No Typology Code available for payment source | Admitting: Neurology

## 2022-04-15 NOTE — Progress Notes (Signed)
56 y.o. G20P0000 Married Caucasian female here for annual exam.    No GYN concerns.   New dx of prediabetes.   PCP:   Fredda Hammed, PA  Patient's last menstrual period was 11/25/2009.           Sexually active: Yes.    The current method of family planning is status post hysterectomy.    Exercising: No.  The patient does not participate in regular exercise at present. Smoker:  no  Health Maintenance: Pap:   2010 Neg:Neg HR HPV History of abnormal Pap:  yes, years ago per patient - normal since MMG:  05-01-21 Neg/Birads2 Colonoscopy:   05/02/15 - polyps ;10 years BMD: 05-01-21  Result :Osteopenia TDaP:  2014 Gardasil:   no HIV: Neg in past Hep C: Neg in past Screening Labs:  PCP   reports that she has never smoked. She has never used smokeless tobacco. She reports current alcohol use. She reports that she does not use drugs.  Past Medical History:  Diagnosis Date   Anemia    HX   Chronic migraine    neurologist-  dr Metta Clines   Depression    Gastric ulcer 2019   Hemorrhoids    Hyperlipidemia    Hypertension 2010   Dr. Arbie Cookey webb. stable   OCD (obsessive compulsive disorder)    PONV (postoperative nausea and vomiting)    Pre-diabetes 02/20/2021   Urethral lesion     Past Surgical History:  Procedure Laterality Date   BREAST BIOPSY Left 07/06/2018   Procedure: LEFT BREAST EXCISIONAL BIOSPY;  Surgeon: [name], MD;  Location: [facility];  Service: General;  Laterality: Left;   CYSTOSCOPY WITH BIOPSY N/A 12/26/2018   Procedure: CYSTOSCOPY WITH URETHRAL AND TRANSVAGINAL BIOPSY;  Surgeon: Irine Seal, MD;  Location: [facility];  Service: Urology;  Laterality: N/A;   D & C HYSTEROSCOPY WITH RESECTION ENDOMETRIAL POLYP  12-17-2008  dr Joan Flores '@[facility]'$    LAPAROSCOPIC APPENDECTOMY N/A 10/31/2015   Procedure: APPENDECTOMY LAPAROSCOPIC;  Surgeon: Erroll Luna, MD;  Location: [facility];  Service: General;  Laterality: N/A;   ROBOTIC ASSISTED TOTAL HYSTERECTOMY  04/  05/11    dr romine @ [facility]   simple hyperplasia, fibroids   TONSILLECTOMY  child    Current Outpatient Medications  Medication Sig Dispense Refill   amLODipine (NORVASC) 5 MG tablet Take 5 mg by mouth every evening.      azelastine (ASTELIN) 0.1 % nasal spray Place 1 spray into both nostrils daily.      Calcium-Magnesium-Zinc 333-133-5 MG TABS Take 2 tablets by mouth daily.     Cholecalciferol (VITAMIN D3) 1.25 MG (50000 UT) TABS Take by mouth once a week.     ferrous sulfate 325 (65 FE) MG tablet Take 325 mg by mouth daily with breakfast.     montelukast (SINGULAIR) 10 MG tablet Take 10 mg by mouth daily.     Multiple Vitamin (MULTIVITAMIN) capsule Take 1 capsule by mouth daily.     Omega-3 1000 MG CAPS Take by mouth.     pravastatin (PRAVACHOL) 10 MG tablet Take 10 mg by mouth 2 (two) times a week.     Probiotic Product (PROBIOTIC-10 PO) Take 1 capsule by mouth daily.      SUMAtriptan (IMITREX) 100 MG tablet TAKE 1 TABLET BY MOUTH AT ONSET OF MIGRAINE. MAY REPEAT DOSE ONCE IN 2 HOURS IF HEADACHE PERSITS OR RECURS 32 tablet 3   vitamin B-12 (CYANOCOBALAMIN) 500 MCG tablet Take 500 mcg by  mouth daily.     No current facility-administered medications for this visit.    Family History  Problem Relation Age of Onset   Hyperlipidemia Mother    Dementia Mother    Heart disease Father    Heart disease Maternal Grandmother    Heart disease Maternal Grandfather     Review of Systems  All other systems reviewed and are negative.   Exam:   BP 120/74   Pulse 79   Ht 5' 4.5" (1.638 m)   Wt 141 lb (64 kg)   LMP 11/25/2009 Comment: age  SpO2 98%   BMI 23.83 kg/m     General appearance: alert, cooperative and appears stated age Head: normocephalic, without obvious abnormality, atraumatic Neck: no adenopathy, supple, symmetrical, trachea midline and thyroid normal to inspection and palpation Lungs: clear to auscultation bilaterally Breasts: normal appearance, no masses or tenderness, No  nipple retraction or dimpling, No nipple discharge or bleeding, No axillary adenopathy Heart: regular rate and rhythm Abdomen: soft, non-tender; no masses, no organomegaly Extremities: extremities normal, atraumatic, no cyanosis or edema Skin: skin color, texture, turgor normal. No rashes or lesions Lymph nodes: cervical, supraclavicular, and axillary nodes normal. Neurologic: grossly normal  Pelvic: External genitalia:  no lesions              No abnormal inguinal nodes palpated.              Urethra:  normal appearing urethra with no masses, tenderness or lesions              Bartholins and Skenes: normal                 Vagina: normal appearing vagina with normal color and discharge, no lesions              Cervix: absent              Pap taken: no Bimanual Exam:  Uterus: absent              Adnexa: no mass, fullness, tenderness              Rectal exam: yes.  Confirms.              Anus:  normal sphincter tone, no lesions  Chaperone was present for exam:  Estill Bamberg, [place]  Assessment:   Well woman visit with gynecologic exam. Status post hysterectomy. Ovaries remain.  Hx left breast biopsy.  Cystitic cystica.  Hx migraine with aura.  Hx ulcer.  Vegetarian.  Osteopenia.   Plan: Mammogram screening discussed. Self breast awareness reviewed. Pap and HR HPV as above. Guidelines for Calcium, Vitamin D, regular exercise program including cardiovascular and weight bearing exercise. We reviewed her BMD and low risk of fracture by FRAX.   Next BMD in 2 years.   Follow up annually and prn.   After visit summary provided.

## 2022-04-20 ENCOUNTER — Ambulatory Visit (INDEPENDENT_AMBULATORY_CARE_PROVIDER_SITE_OTHER): Payer: No Typology Code available for payment source | Admitting: Obstetrics and Gynecology

## 2022-04-20 ENCOUNTER — Encounter: Payer: Self-pay | Admitting: Obstetrics and Gynecology

## 2022-04-20 VITALS — BP 120/74 | HR 79 | Ht 64.5 in | Wt 141.0 lb

## 2022-04-20 DIAGNOSIS — Z01419 Encounter for gynecological examination (general) (routine) without abnormal findings: Secondary | ICD-10-CM | POA: Diagnosis not present

## 2022-04-20 NOTE — Patient Instructions (Signed)

## 2022-05-17 ENCOUNTER — Encounter: Payer: Self-pay | Admitting: Obstetrics and Gynecology

## 2022-08-25 ENCOUNTER — Ambulatory Visit: Payer: No Typology Code available for payment source | Admitting: Neurology

## 2022-08-31 NOTE — Progress Notes (Unsigned)
NEUROLOGY FOLLOW UP OFFICE NOTE  Kristina Dunlap [id number]  Assessment/Plan:   1.  Migraine without aura, without status migrainosus, not intractable 2  Medication overuse headache   1.  Migraine prevention:  Plan to start Emgality 2.  Migraine rescue:  Continue sumatriptan as needed.  Advised to discontinue Goodys (may wean off if more tolerable).  Limit use of pain relievers to no more than 2 days out of week to prevent risk of rebound or medication-overuse headache. 3. Keep headache diary 5.  Follow up 4 to 5 months.  Subjective:  Kristina Dunlap is a 57 year old right-handed woman with hypercholesterolemia, hypertension, and GERD who follows up for migraines.   UPDATE: Started [name]. Ineffective.  Nurtec ineffective. Still has a daily headache Migraines: Intensity:  severe Duration:  1 hour with sumatriptan Frequency:  4-5 days a month Takes Goodys daily.    Current NSAIDS/analgesics:  Goodys (although shouldn't be taking as NSAIDs have caused a stomach ulcer) Current triptans:   no Current ergotamine:  no Current anti-emetic:  no Current muscle relaxants:  no Current anti-anxiolytic:  no Current sleep aide:  no Current Antihypertensive medications: amlodipine Current Antidepressant medications:   citalopram '60mg'$  Current Anticonvulsant medications:   None Current anti-CGRP:  Ajovy, Nurtec (rescue) Current Vitamins/Herbal/Supplements:  vitamin D Current Antihistamines/Decongestants:  Zyrtec, Astelin NS Other therapy:  none   Caffeine:  Decaf tea, no coffee, occasional soda Alcohol:  Occasional  Smoker:  no Diet:  water Exercise:  no Depression:  no; Anxiety:  no Other pain:  no Sleep hygiene:  good   HISTORY:  Onset: Headaches since 57 years old but became daily in 2000. Location:  Bilateral periorbital/temples radiating into neck Quality:  throbbing Initial intensity:  Moderate to severe.  She denies new headache, thunderclap headache or severe headache  that wakes her from sleep. Aura:  no Prodrome:  no Postdrome:  no Associated symptoms: Photophobia, phonophobia.  She denies associated nausea, vomiting, visual disturbance, unilateral numbness or weakness. Initial duration:  All day Initial Frequency:  Daily (severe headaches occur once a month) Initial Frequency of abortive medication: Tylenol 3 days a week Triggers: None Aggravating factors: Bending over Relieving factors: None Activity:  Aggravated only when severe   Past NSAIDS:  Ibuprofen, naproxen Past analgesics:  Tylenol, Fioricet, Excedrin Past abortive triptans:  rizatriptan, sumatriptan tab Past abortive ergotamine:  no Past muscle relaxants:  tizanidine '4mg'$ , balcofen '10mg'$  Past anti-emetic:  Zofran ODT '4mg'$ , promethazine suppository '25mg'$  Past antihypertensive medications:  lisinopril, HCTZ Past antidepressant medications:  bupropion Past anticonvulsant medications:   Trokendi XR (made her feel dizzy) Past anti-CGRP:  Ubrelvy '100mg'$ , Nurtec, Aimovig '140mg'$ , Ajovy Past vitamins/Herbal/Supplements:  no Past antihistamines/decongestants:  Flonase, Benadryl Other past therapies:  Botox (one round because could not afford it)   Family history of headache:  No  PAST MEDICAL HISTORY: Past Medical History:  Diagnosis Date   Anemia    HX   Chronic migraine    neurologist-  dr Quita Skye Anamarie Hunn   Depression    Gastric ulcer 2019   Hemorrhoids    Hyperlipidemia    Hypertension 2010   Dr. Arbie Cookey webb. stable   OCD (obsessive compulsive disorder)    PONV (postoperative nausea and vomiting)    Pre-diabetes 02/20/2021   Urethral lesion     MEDICATIONS: Current Outpatient Medications on File Prior to Visit  Medication Sig Dispense Refill   amLODipine (NORVASC) 5 MG tablet Take 5 mg by mouth every evening.  azelastine (ASTELIN) 0.1 % nasal spray Place 1 spray into both nostrils daily.      Calcium-Magnesium-Zinc 333-133-5 MG TABS Take 2 tablets by mouth daily.      Cholecalciferol (VITAMIN D3) 1.25 MG (50000 UT) TABS Take by mouth once a week.     ferrous sulfate 325 (65 FE) MG tablet Take 325 mg by mouth daily with breakfast.     montelukast (SINGULAIR) 10 MG tablet Take 10 mg by mouth daily.     Multiple Vitamin (MULTIVITAMIN) capsule Take 1 capsule by mouth daily.     Omega-3 1000 MG CAPS Take by mouth.     pravastatin (PRAVACHOL) 10 MG tablet Take 10 mg by mouth 2 (two) times a week.     Probiotic Product (PROBIOTIC-10 PO) Take 1 capsule by mouth daily.      SUMAtriptan (IMITREX) 100 MG tablet TAKE 1 TABLET BY MOUTH AT ONSET OF MIGRAINE. MAY REPEAT DOSE ONCE IN 2 HOURS IF HEADACHE PERSITS OR RECURS 32 tablet 3   vitamin B-12 (CYANOCOBALAMIN) 500 MCG tablet Take 500 mcg by mouth daily.     No current facility-administered medications on file prior to visit.    ALLERGIES: Allergies  Allergen Reactions   Erythromycin Nausea Only and Other (See Comments)    FAMILY HISTORY: Family History  Problem Relation Age of Onset   Hyperlipidemia Mother    Dementia Mother    Heart disease Father    Heart disease Maternal Grandmother    Heart disease Maternal Grandfather       Objective:  Blood pressure 136/85, pulse 86, height '5\' 5"'$  (1.651 m), weight 140 lb 3.2 oz (63.6 kg), last menstrual period 11/25/2009, SpO2 98 %. General: No acute distress.  Patient appears well-groomed.   Head:  Normocephalic/atraumatic Eyes:  Fundi examined but not visualized Neck: supple, no paraspinal tenderness, full range of motion Heart:  Regular rate and rhythm Neurological Exam: alert and oriented to person, place, and time.  Speech fluent and not dysarthric, language intact.  CN II-XII intact. Bulk and tone normal, muscle strength 5/5 throughout.  Sensation to light touch intact.  Deep tendon reflexes 2+ throughout.  Finger to nose testing intact.  Gait normal, Romberg negative.   Metta Clines, DO

## 2022-09-01 ENCOUNTER — Ambulatory Visit: Payer: No Typology Code available for payment source | Admitting: Neurology

## 2022-09-01 ENCOUNTER — Encounter: Payer: Self-pay | Admitting: Neurology

## 2022-09-01 ENCOUNTER — Telehealth: Payer: Self-pay

## 2022-09-01 VITALS — BP 136/85 | HR 86 | Ht 65.0 in | Wt 140.2 lb

## 2022-09-01 DIAGNOSIS — G43109 Migraine with aura, not intractable, without status migrainosus: Secondary | ICD-10-CM | POA: Diagnosis not present

## 2022-09-01 DIAGNOSIS — G4440 Drug-induced headache, not elsewhere classified, not intractable: Secondary | ICD-10-CM | POA: Diagnosis not present

## 2022-09-01 MED ORDER — SUMATRIPTAN SUCCINATE 100 MG PO TABS: ORAL_TABLET | ORAL | 3 refills | Status: DC

## 2022-09-01 MED ORDER — EMGALITY 120 MG/ML ~~LOC~~ SOAJ: 240.0000 mg | Freq: Once | SUBCUTANEOUS | 0 refills | Status: DC

## 2022-09-01 NOTE — Patient Instructions (Signed)
Start Emgality - first dose is a loading dose - 2 injections.  When you pick it up, contact me afterwards and I will send prescription for the standing order (1 injection every 28 days) Sumatriptan as directed.  Limit use of pain relievers to no more than 2 days out of week to prevent risk of rebound or medication-overuse headache. Stop Goodys.  Taper off by one day every week Follow up 4 to 5 months.

## 2022-09-01 NOTE — Telephone Encounter (Signed)
Patient seen in office today. Per Dr.Jaffe patient is to start Emgality.  PA team please start a PA for Emglaity 120 mg

## 2022-09-08 NOTE — Telephone Encounter (Signed)
Pt called in and left a message with the access nurse. She is wanting to find out if she was approved for [employer]?

## 2022-09-08 NOTE — Telephone Encounter (Signed)
Patient advised PA pending.  PA Team please advise of Status.

## 2022-09-20 NOTE — Telephone Encounter (Signed)
Patient is calling in asking for an update on the authorization.

## 2022-09-22 ENCOUNTER — Other Ambulatory Visit (HOSPITAL_COMMUNITY): Payer: Self-pay

## 2022-09-28 ENCOUNTER — Other Ambulatory Visit (HOSPITAL_COMMUNITY): Payer: Self-pay

## 2022-09-30 NOTE — Telephone Encounter (Signed)
Patient is calling in again asking for an update, upset that it has been almost a month waiting on this authorization.

## 2022-10-01 ENCOUNTER — Other Ambulatory Visit (HOSPITAL_COMMUNITY): Payer: Self-pay

## 2022-10-01 NOTE — Telephone Encounter (Signed)
LMOVm for patient per Dr.Jaffe okay to come by to pick up sample of Ajovy until we can get the [facility] approved.

## 2022-10-01 NOTE — Telephone Encounter (Signed)
Cmm request canceled out. PA not supported by CMMS.  Urgent PA submitted to [employer]pt[employer] Portal.

## 2022-10-01 NOTE — Telephone Encounter (Signed)
PA has been APPROVED from 10/01/2022-10/01/2023.  Approval letter has been attached in patient documents.

## 2022-10-04 MED ORDER — EMGALITY 120 MG/ML ~~LOC~~ SOAJ: 240.0000 mg | Freq: Once | SUBCUTANEOUS | 0 refills | Status: AC

## 2022-10-04 NOTE — Telephone Encounter (Signed)
Patient advised of Approval.  Resent Loading dose script just in case the pharmacy discarded the one sent at visit.

## 2022-10-04 NOTE — Addendum Note (Signed)
Addended by: Venetia Night on: 10/04/2022 03:17 PM   Modules accepted: Orders

## 2022-10-11 ENCOUNTER — Telehealth: Payer: Self-pay | Admitting: Anesthesiology

## 2022-10-11 MED ORDER — EMGALITY 120 MG/ML ~~LOC~~ SOAJ: 240.0000 mg | Freq: Once | SUBCUTANEOUS | 0 refills | Status: AC

## 2022-10-11 NOTE — Telephone Encounter (Signed)
Pt called stating her pharmacy called her to let her know that her insurance will not cover the starter kit for Emgality it will only cover on dose. Requests call back.

## 2022-10-11 NOTE — Telephone Encounter (Signed)
PA Team PA needed for Loading dose. Emgality 240 mg at once.

## 2022-11-02 ENCOUNTER — Other Ambulatory Visit (HOSPITAL_COMMUNITY): Payer: Self-pay

## 2022-11-02 ENCOUNTER — Telehealth: Payer: Self-pay

## 2022-11-02 NOTE — Telephone Encounter (Signed)
Patient Advocate Encounter   Received notification from [employer] that prior authorization is required for EMGALITY 120 MG/ML PEN.  This was submitted with PromptPA  Submitted: 11-02-2022 Prior Auth (EOC) ID: [id number]  Status is pending

## 2022-11-03 NOTE — Telephone Encounter (Signed)
LMOVm to call the office back.

## 2022-11-22 ENCOUNTER — Telehealth: Payer: Self-pay | Admitting: Neurology

## 2022-11-22 NOTE — Telephone Encounter (Signed)
Pt went to the pharmacy to get her loading dose of the Emaglity. They told her the insurance has to approve the loading dose. The Emgality card cannot be used on that particular dose.

## 2022-11-25 ENCOUNTER — Other Ambulatory Visit (HOSPITAL_COMMUNITY): Payer: Self-pay

## 2022-11-25 NOTE — Telephone Encounter (Signed)
  Urgent PA submitted to [employer] Portal.

## 2022-11-25 NOTE — Telephone Encounter (Signed)
Ran test claim and it says was filled today

## 2022-11-26 NOTE — Telephone Encounter (Signed)
Spoke to [employer] rep please send a Amendment with the Message Loading dose to be request 240 mg two pens. Suppose to be with approval EOC# [id number].  Required to start Emgality.

## 2022-12-07 NOTE — Telephone Encounter (Signed)
Patient advised letter sent per Rep at insurance to say two pens loading dose required to start medication.

## 2022-12-07 NOTE — Telephone Encounter (Signed)
Patient is calling in asking for an update, pharmacy states they are still waiting on [place].

## 2022-12-10 ENCOUNTER — Encounter: Payer: Self-pay | Admitting: Neurology

## 2022-12-10 NOTE — Telephone Encounter (Signed)
Pt called stating she just spoke with the benefits department from her insurance and she was told Dr Everlena Cooper needs to send them a letter stating the medical necessity for [facility], with this PA # [id number] and it needs to be faxed to this number [phone].

## 2022-12-10 NOTE — Telephone Encounter (Signed)
Per patient the letter has to say the loading dose is medically necessary.   Per patient this is taking too long is there anything else we can give her. Feels the insurance will nevr cover the Loading dose.

## 2022-12-10 NOTE — Telephone Encounter (Signed)
LMOVM for patient, I want to discontinue pursuing Emgality at this time.  Instead, I would like to try to start her on Vyepti  infusion every 3 months

## 2022-12-13 ENCOUNTER — Telehealth: Payer: Self-pay | Admitting: Pharmacy Technician

## 2022-12-13 NOTE — Telephone Encounter (Addendum)
No auth required.   Predetermination request: approved  Auth Submission: approved Site of care: Site of care: [facility] Payer: aetna - [employer]ain Medication & CPT/J Code(s) submitted: Vyepti (Eptinezumab) [id number] Route of submission (phone, fax, portal):  Phone # [phone] Fax # [phone] -Auth type: Buy/Bill- Units/visits requested: 300mg  q3 months Reference number: [id number] Pre-determination: approved Approval from: 01/04/23  to  07/07/23  Vyepti co-pay card: approved Id: [id number] Card has been scanned to media tab.

## 2022-12-22 ENCOUNTER — Telehealth: Payer: Self-pay | Admitting: Neurology

## 2022-12-22 NOTE — Telephone Encounter (Signed)
New message    Patient is asking can Dr. Everlena Cooper complete her FMLA paperwork returning to work on Thursday or Friday this week. Will bring the paperwork by the office.

## 2022-12-22 NOTE — Telephone Encounter (Signed)
Called patient and let her know the office policy for FMLA paperwork 7-10 business days with a 25$ fee

## 2022-12-24 DIAGNOSIS — Z0279 Encounter for issue of other medical certificate: Secondary | ICD-10-CM

## 2022-12-27 NOTE — Progress Notes (Unsigned)
FMLA Paperwork Received today.

## 2023-01-06 ENCOUNTER — Encounter: Payer: Self-pay | Admitting: Neurology

## 2023-01-24 ENCOUNTER — Ambulatory Visit: Payer: Self-pay | Admitting: Surgery

## 2023-02-09 ENCOUNTER — Ambulatory Visit: Payer: No Typology Code available for payment source

## 2023-02-10 ENCOUNTER — Telehealth: Payer: Self-pay | Admitting: Neurology

## 2023-02-10 NOTE — Telephone Encounter (Signed)
Pt is calling in wanting to know if she should keep the appointment on Monday since she was not able to get her medication due to insurance and was started on infusions.  Pt would like to have a call back.

## 2023-02-11 NOTE — Telephone Encounter (Signed)
Patient advised of Dr.Jaffe note, It would probably be more accurate to reassess after 6 months (or 2 full rounds of the Vyepti).  Unless she would like to keep the appointment, she may reschedule.      Patient would like to reschedule her appt to 6 months.

## 2023-02-14 ENCOUNTER — Ambulatory Visit: Payer: No Typology Code available for payment source | Admitting: Neurology

## 2023-03-04 ENCOUNTER — Ambulatory Visit (INDEPENDENT_AMBULATORY_CARE_PROVIDER_SITE_OTHER): Payer: No Typology Code available for payment source

## 2023-03-04 VITALS — BP 143/82 | HR 64 | Temp 98.2°F | Resp 16 | Ht 65.0 in | Wt 137.6 lb

## 2023-03-04 DIAGNOSIS — G43109 Migraine with aura, not intractable, without status migrainosus: Secondary | ICD-10-CM

## 2023-03-04 MED ORDER — SODIUM CHLORIDE 0.9 % IV SOLN
300.0000 mg | Freq: Once | INTRAVENOUS | Status: AC
  Administered 2023-03-04: 300 mg via INTRAVENOUS
  Filled 2023-03-04: qty 3

## 2023-03-04 NOTE — Progress Notes (Signed)
Diagnosis: Migraine  Provider:  Chilton Greathouse MD  Procedure: IV Infusion  IV Type: Peripheral, IV Location: L Antecubital  Vyepti (Eptinezumab-jjmr), Dose: 300 mg  Infusion Start Time: 1408  Infusion Stop Time: 1442  Pt reported some mild itching to neck area and arms during observation period. Denies any chest pain/tightness or difficulty breathing. No rash or redness noted. Pt refuses Benadryl. Monitored for additional time period. Pt reports improvement, and states she feels "fine", and will take Benadryl when she gets home if needed. Instructed to call 911 or go to ED if difficulty breathing, chest pain, hives, swelling in face/tongue, or difficulty swallowing. Verbalized understanding.   Post Infusion IV Care: Observation period completed and Peripheral IV Discontinued  Discharge: Condition: Good, Destination: Home . AVS Provided  Performed by:  Garnette Czech, RN

## 2023-04-12 NOTE — Progress Notes (Signed)
[name] here for annual exam.    Has prediabetes.   Having a lot of fatigue.  She will see her PCP.   Had Covid earlier this year.   Working a lot.   PCP:   Kristina Dakin, PA Psychiatrist:   Dr. Evelene Dunlap  Patient's last menstrual period was 11/25/2009.           Sexually active: Yes.    The current method of family planning is status post hysterectomy.    Exercising: No.   Smoker:  no  Health Maintenance: Pap:  2010 neg: HR HPV neg History of abnormal Pap:  yes, years ago per pt MMG:  05/17/22 Breast density Cat B, BI-RADS CAT 1 neg Colonoscopy:  05/02/15 - due in 10 years BMD:   05/01/21  Result  osteopenia TDaP:  01/05/13 Gardasil:   no HIV: neg in past Hep C: neg in past Screening Labs:  PCP   reports that she has never smoked. She has never used smokeless tobacco. She reports current alcohol use. She reports that she does not use drugs.  Past Medical History:  Diagnosis Date   Anemia    HX   Chronic migraine    neurologist-  Kristina Kristina Dunlap   Depression    Gastric ulcer 2019   Hemorrhoids    Hyperlipidemia    Hypertension 2010   Kristina. Okey Regal Dunlap. stable   OCD (obsessive compulsive disorder)    PONV (postoperative nausea and vomiting)    Pre-diabetes 02/20/2021   Urethral lesion     Past Surgical History:  Procedure Laterality Date   BREAST BIOPSY Left 07/06/2018   Procedure: LEFT BREAST EXCISIONAL BIOSPY;  Surgeon: Kristina Lint, MD;  Location: [facility];  Service: General;  Laterality: Left;   CYSTOSCOPY WITH BIOPSY N/A 12/26/2018   Procedure: CYSTOSCOPY WITH URETHRAL AND TRANSVAGINAL BIOPSY;  Surgeon: Kristina Pippin, MD;  Location: [facility];  Service: Urology;  Laterality: N/A;   D & C HYSTEROSCOPY WITH RESECTION ENDOMETRIAL POLYP  12-17-2008  Kristina Kristina Dunlap @[facility]    LAPAROSCOPIC APPENDECTOMY N/A 10/31/2015   Procedure: APPENDECTOMY LAPAROSCOPIC;  Surgeon: Kristina Bouillon, MD;  Location: [facility];  Service: General;  Laterality:  N/A;   ROBOTIC ASSISTED TOTAL HYSTERECTOMY  04/ 05/11    Kristina Dunlap @ [facility]   simple hyperplasia, fibroids   TONSILLECTOMY  child    Current Outpatient Medications  Medication Sig Dispense Refill   amLODipine (NORVASC) 5 MG tablet Take 5 mg by mouth every evening.      azelastine (ASTELIN) 0.1 % nasal spray Place 1 spray into both nostrils daily.      Calcium-Magnesium-Zinc 333-133-5 MG TABS Take 2 tablets by mouth daily.     Cholecalciferol (VITAMIN D3) 1.25 MG (50000 UT) TABS Take by mouth once a week.     citalopram (CELEXA) 20 MG tablet Take 20 mg by mouth daily.     ferrous sulfate 325 (65 FE) MG tablet Take 325 mg by mouth daily with breakfast.     montelukast (SINGULAIR) 10 MG tablet Take 10 mg by mouth daily.     Multiple Vitamin (MULTIVITAMIN) capsule Take 1 capsule by mouth daily.     Omega-3 1000 MG CAPS Take by mouth.     pantoprazole (PROTONIX) 40 MG tablet Take 40 mg by mouth 2 (two) times daily.     pravastatin (PRAVACHOL) 10 MG tablet Take 10 mg by mouth 2 (two) times a week.     Probiotic Product (  PROBIOTIC-10 PO) Take 1 capsule by mouth daily.      SUMAtriptan (IMITREX) 100 MG tablet TAKE 1 TABLET BY MOUTH AT ONSET OF MIGRAINE. MAY REPEAT DOSE ONCE IN 2 HOURS IF HEADACHE PERSITS OR RECURS 32 tablet 3   vitamin B-12 (CYANOCOBALAMIN) 500 MCG tablet Take 500 mcg by mouth daily.     No current facility-administered medications for this visit.    Family History  Problem Relation Age of Onset   Hyperlipidemia Mother    Dementia Mother    Heart disease Father    Heart disease Maternal Grandmother    Heart disease Maternal Grandfather     Review of Systems  All other systems reviewed and are negative.   Exam:   Ht 5\' 5"  (1.651 m)   Wt 137 lb (62.1 kg)   LMP 11/25/2009 Comment: age  BMI 22.80 kg/m     General appearance: alert, cooperative and appears stated age Head: normocephalic, without obvious abnormality, atraumatic Neck: no adenopathy, supple, symmetrical,  trachea midline and thyroid normal to inspection and palpation Lungs: clear to auscultation bilaterally Breasts: normal appearance, no masses or tenderness, No nipple retraction or dimpling, No nipple discharge or bleeding, No axillary adenopathy Heart: regular rate and rhythm Abdomen: soft, non-tender; no masses, no organomegaly Extremities: extremities normal, atraumatic, no cyanosis or edema Skin: skin color, texture, turgor normal. No rashes or lesions Lymph nodes: cervical, supraclavicular, and axillary nodes normal. Neurologic: grossly normal  Pelvic: External genitalia:  no lesions              No abnormal inguinal nodes palpated.              Urethra:  normal appearing urethra with no masses, tenderness or lesions              Bartholins and Skenes: normal                 Vagina: atrophy noted.  No lesions.               Cervix: absent              Pap taken: no Bimanual Exam:  Uterus:  absent              Adnexa: no mass, fullness, tenderness              Rectal exam: yes.  Confirms.              Anus:  normal sphincter tone, no lesions  Chaperone was present for exam:  Kristina Dunlap, CMA  Assessment:   Well woman visit with gynecologic exam. Status post hysterectomy. Ovaries remain.  Vaginal atrophy.  Hx left breast biopsy.  Cystitic cystica.  Hx migraine with aura.  Hx ulcer.  Vegetarian.  Osteopenia.  Fatigue.    Plan: Mammogram screening discussed. Self breast awareness reviewed. Pap and HR HPV not indicated.  We discussed vaginal atrophy and tx options with water based lubricants, cooking oils, vaginal vit E and vaginal estrogens.  No Rx given today.  Guidelines for Calcium, Vitamin D, regular exercise program including cardiovascular and weight bearing exercise. [facility] fall [facility].  She will try to coordinate with her mammogram day.   TDap. She has an appointment to see her PCP regarding her fatigue.  Follow up annually and prn.   After visit summary  provided.

## 2023-04-26 ENCOUNTER — Ambulatory Visit (INDEPENDENT_AMBULATORY_CARE_PROVIDER_SITE_OTHER): Payer: No Typology Code available for payment source | Admitting: Obstetrics and Gynecology

## 2023-04-26 ENCOUNTER — Encounter: Payer: Self-pay | Admitting: Obstetrics and Gynecology

## 2023-04-26 VITALS — BP 136/78 | HR 65 | Ht 65.0 in | Wt 137.0 lb

## 2023-04-26 DIAGNOSIS — Z01419 Encounter for gynecological examination (general) (routine) without abnormal findings: Secondary | ICD-10-CM | POA: Diagnosis not present

## 2023-04-26 DIAGNOSIS — Z23 Encounter for immunization: Secondary | ICD-10-CM

## 2023-04-26 NOTE — Patient Instructions (Addendum)

## 2023-05-19 ENCOUNTER — Encounter: Payer: Self-pay | Admitting: Obstetrics and Gynecology

## 2023-05-24 ENCOUNTER — Encounter: Payer: Self-pay | Admitting: Obstetrics and Gynecology

## 2023-06-10 ENCOUNTER — Ambulatory Visit (INDEPENDENT_AMBULATORY_CARE_PROVIDER_SITE_OTHER): Payer: No Typology Code available for payment source

## 2023-06-10 VITALS — BP 130/74 | HR 74 | Temp 98.6°F | Resp 16 | Ht 65.0 in | Wt 140.0 lb

## 2023-06-10 DIAGNOSIS — G43109 Migraine with aura, not intractable, without status migrainosus: Secondary | ICD-10-CM | POA: Diagnosis not present

## 2023-06-10 MED ORDER — SODIUM CHLORIDE 0.9 % IV SOLN
300.0000 mg | Freq: Once | INTRAVENOUS | Status: AC
  Administered 2023-06-10: 300 mg via INTRAVENOUS
  Filled 2023-06-10: qty 3

## 2023-06-10 NOTE — Progress Notes (Signed)
Diagnosis: Migraines  Provider:  Chilton Greathouse MD  Procedure: IV Infusion  IV Type: Peripheral, IV Location: L Antecubital  Vyepti (Eptinezumab-jjmr), Dose: 300 mg  Infusion Start Time: 1402  Infusion Stop Time: 1435  Post Infusion IV Care: Peripheral IV Discontinued  Discharge: Condition: Good, Destination: Home . AVS Provided  Performed by:  Nat Math, RN

## 2023-06-14 ENCOUNTER — Other Ambulatory Visit: Payer: Self-pay | Admitting: Pharmacy Technician

## 2023-06-15 ENCOUNTER — Telehealth: Payer: Self-pay

## 2023-06-15 NOTE — Telephone Encounter (Signed)
Auth Submission: NO AUTH NEEDED Site of care: Site of care: [facility] Payer: [employer] Medication & CPT/J Code(s) submitted: Vyepti (Eptinezumab) [phone] Route of submission (phone, fax, portal): phone Phone # Fax # Auth type: Buy/Bill PB Units/visits requested: 300mg , q3 months Reference number: [id number] Approval from: 06/15/23 to 08/23/23    Copay card is scanned in media tab.

## 2023-06-20 ENCOUNTER — Telehealth: Payer: Self-pay | Admitting: Pharmacy Technician

## 2023-06-20 NOTE — Telephone Encounter (Signed)
Left message for patient to call back to get schedule for the follow up appt with [facility]

## 2023-06-20 NOTE — Telephone Encounter (Signed)
Dr. Everlena Cooper / Zenon Mayo.  Continuation of Therapy.  For patient to continue with Vyepti, insurance is requesting clinical documentation that the patient is doing well. Please addend chart notes stating that the patient is doing well. Current dose 300mg  q72months.  Thanks Selena Batten

## 2023-06-20 NOTE — Telephone Encounter (Signed)
Dr. Everlena Cooper, Noted. Thanks

## 2023-06-22 ENCOUNTER — Telehealth: Payer: Self-pay | Admitting: Neurology

## 2023-06-22 NOTE — Telephone Encounter (Signed)
Per patient the two infusion she has had so far hasn't helped at all.   Patient wants to know how long does it take for the medication to work.

## 2023-06-22 NOTE — Progress Notes (Deleted)
NEUROLOGY FOLLOW UP OFFICE NOTE  [place] [id number]  Assessment/Plan:   1.  Migraine without aura, without status migrainosus, not intractable 2  Medication overuse headache   1.  Migraine prevention:  Plan to start Emgality 2.  Migraine rescue:  Continue sumatriptan as needed.  Advised to discontinue Goodys (may wean off if more tolerable).  Limit use of pain relievers to no more than 2 days out of week to prevent risk of rebound or medication-overuse headache. 3. Keep headache diary 5.  Follow up 4 to 5 months.  Subjective:  Kristina Dunlap is a 57 year old right-handed woman with hypercholesterolemia, hypertension, and GERD who follows up for migraines.   UPDATE: Plan was to start Emgality.  It was approved but, curiously, her insurance would not approve the loading dose of 2 pens.  Therefore, she was switched to Vyepti 300mg  Migraines: Intensity:  severe Duration:  1 hour with sumatriptan Frequency:  4-5 days a month Takes Goodys daily.    Current NSAIDS/analgesics:  Goodys (although shouldn't be taking as NSAIDs have caused a stomach ulcer) Current triptans:   no Current ergotamine:  no Current anti-emetic:  no Current muscle relaxants:  no Current anti-anxiolytic:  no Current sleep aide:  no Current Antihypertensive medications: amlodipine Current Antidepressant medications:   citalopram 60mg  Current Anticonvulsant medications:   None Current anti-CGRP:  Vyepti 300mg  Current Vitamins/Herbal/Supplements:  vitamin D Current Antihistamines/Decongestants:  Zyrtec, Astelin NS Other therapy:  none   Caffeine:  Decaf tea, no coffee, occasional soda Alcohol:  Occasional  Smoker:  no Diet:  water Exercise:  no Depression:  no; Anxiety:  no Other pain:  no Sleep hygiene:  good   HISTORY:  Onset: Headaches since 57 years old but became daily in 2000. Location:  Bilateral periorbital/temples radiating into neck Quality:  throbbing Initial intensity:  Moderate  to severe.  She denies new headache, thunderclap headache or severe headache that wakes her from sleep. Aura:  no Prodrome:  no Postdrome:  no Associated symptoms: Photophobia, phonophobia.  She denies associated nausea, vomiting, visual disturbance, unilateral numbness or weakness. Initial duration:  All day Initial Frequency:  Daily (severe headaches occur once a month) Initial Frequency of abortive medication: Tylenol 3 days a week Triggers: None Aggravating factors: Bending over Relieving factors: None Activity:  Aggravated only when severe   Past NSAIDS:  Ibuprofen, naproxen Past analgesics:  Tylenol, Fioricet, Excedrin Past abortive triptans:  rizatriptan, sumatriptan tab Past abortive ergotamine:  no Past muscle relaxants:  tizanidine 4mg , balcofen 10mg  Past anti-emetic:  Zofran ODT 4mg , promethazine suppository 25mg  Past antihypertensive medications:  lisinopril, HCTZ Past antidepressant medications:  bupropion Past anticonvulsant medications:   Trokendi XR (made her feel dizzy) Past anti-CGRP:  Ubrelvy 100mg , Nurtec, Aimovig 140mg , Ajovy Past vitamins/Herbal/Supplements:  no Past antihistamines/decongestants:  Flonase, Benadryl Other past therapies:  Botox (one round because could not afford it)   Family history of headache:  No  PAST MEDICAL HISTORY: Past Medical History:  Diagnosis Date   Anemia    HX   Chronic migraine    neurologist-  dr Madelaine Bhat Babbie Dondlinger   Depression    Gastric ulcer 2019   Hemorrhoids    Hyperlipidemia    Hypertension 2010   Dr. Okey Regal webb. stable   OCD (obsessive compulsive disorder)    PONV (postoperative nausea and vomiting)    Pre-diabetes 02/20/2021   Urethral lesion     MEDICATIONS: Current Outpatient Medications on File Prior to Visit  Medication Sig Dispense  Refill   amLODipine (NORVASC) 5 MG tablet Take 5 mg by mouth every evening.      azelastine (ASTELIN) 0.1 % nasal spray Place 1 spray into both nostrils daily.       Calcium-Magnesium-Zinc 333-133-5 MG TABS Take 2 tablets by mouth daily.     Cholecalciferol (VITAMIN D3) 1.25 MG (50000 UT) TABS Take by mouth once a week.     citalopram (CELEXA) 20 MG tablet Take 20 mg by mouth daily.     ferrous sulfate 325 (65 FE) MG tablet Take 325 mg by mouth daily with breakfast.     montelukast (SINGULAIR) 10 MG tablet Take 10 mg by mouth daily.     Multiple Vitamin (MULTIVITAMIN) capsule Take 1 capsule by mouth daily.     Omega-3 1000 MG CAPS Take by mouth.     pantoprazole (PROTONIX) 40 MG tablet Take 40 mg by mouth 2 (two) times daily.     pravastatin (PRAVACHOL) 10 MG tablet Take 10 mg by mouth 2 (two) times a week.     Probiotic Product (PROBIOTIC-10 PO) Take 1 capsule by mouth daily.      SUMAtriptan (IMITREX) 100 MG tablet TAKE 1 TABLET BY MOUTH AT ONSET OF MIGRAINE. MAY REPEAT DOSE ONCE IN 2 HOURS IF HEADACHE PERSITS OR RECURS 32 tablet 3   vitamin B-12 (CYANOCOBALAMIN) 500 MCG tablet Take 500 mcg by mouth daily.     No current facility-administered medications on file prior to visit.    ALLERGIES: Allergies  Allergen Reactions   Erythromycin Nausea Only and Other (See Comments)    FAMILY HISTORY: Family History  Problem Relation Age of Onset   Hyperlipidemia Mother    Dementia Mother    Heart disease Father    Heart disease Maternal Grandmother    Heart disease Maternal Grandfather       Objective:  *** General: No acute distress.  Patient appears well-groomed.   Head:  Normocephalic/atraumatic Eyes:  Fundi examined but not visualized Neck: supple, no paraspinal tenderness, full range of motion Heart:  Regular rate and rhythm Neurological Exam: ***   Shon Millet, DO

## 2023-06-22 NOTE — Telephone Encounter (Signed)
Left message with after hour service on 06-22-23 at 12:40 pm    Caller states that she has a infusion on Friday and they are not helping and wants to know if she should cont the infusion

## 2023-06-22 NOTE — Telephone Encounter (Signed)
Patient of Dr.Jaffe note.  I am seeing her on Friday.  We can discuss it then

## 2023-06-24 ENCOUNTER — Ambulatory Visit: Payer: No Typology Code available for payment source | Admitting: Neurology

## 2023-06-27 NOTE — Telephone Encounter (Signed)
Kristina Dunlap called from [facility] asking for additional clinical information. I see that she was going to be seen soon and then new notes would be put in. [place] number is [phone].

## 2023-06-28 ENCOUNTER — Telehealth: Payer: Self-pay

## 2023-06-28 NOTE — Telephone Encounter (Signed)
Front desk called patient to see if patient wanted to be seen since she missed her appt Friday.   Per patient she do not want to continue on the infusions.   Advised front desk to ask patient if she wanted to wait for January visit to not have a preventive medication or switch to a sooner appt. If is up to the patient.

## 2023-07-04 NOTE — Telephone Encounter (Signed)
Kristina Dunlap, Since patient wishes to keep her January appt we will updated chart notes for a continuation of therapy stating the patient has had improvement.    Thanks Selena Batten

## 2023-07-07 NOTE — Telephone Encounter (Signed)
Patient does not have a f/u appt until 08/26/23.  Next appt is scheduled for 09/12/23.  Will send additional clinical to insurance after f/u visit.  Spoke with Asher Muir from [facility].  Awaiting a call back.

## 2023-08-25 NOTE — Progress Notes (Signed)
 NEUROLOGY FOLLOW UP OFFICE NOTE  MAKANA FEIGEL [id number]  Assessment/Plan:   1.  Migraine without aura, without status migrainosus, not intractable 2  Medication overuse headache   1.  Migraine prevention:  Plan to start Emgality .  Provided samples for loading dose. 2.  Migraine rescue:  Continue sumatriptan  as needed.  Advised to discontinue Goodys (may wean off if more tolerable).  Limit use of pain relievers to no more than 2 days out of week to prevent risk of rebound or medication-overuse headache. 3. Keep headache diary 5.  Follow up 4 to 5 months.  Subjective:  SAMYAH BILBO is a 58 year old right-handed woman with hypercholesterolemia, hypertension, and GERD who follows up for migraines.   UPDATE: Has not been able to start Emgality .  Would not cover the loading dose. Still has a daily posterior headache  Migraines: Intensity:  severe Duration:  1 hour with sumatriptan  Frequency:  4-5 days a month   Current NSAIDS/analgesics:  Goodys  Current triptans:   no Current ergotamine:  no Current anti-emetic:  no Current muscle relaxants:  no Current anti-anxiolytic:  no Current sleep aide:  no Current Antihypertensive medications: amlodipine  Current Antidepressant medications:   citalopram  60mg  Current Anticonvulsant medications:   None Current anti-CGRP:  Ajovy , Nurtec (rescue) Current Vitamins/Herbal/Supplements:  vitamin D Current Antihistamines/Decongestants:  Zyrtec, Astelin  NS Other therapy:  none   Caffeine:  Decaf tea, no coffee, occasional soda Alcohol:  Occasional  Smoker:  no Diet:  water  Exercise:  no Depression:  no; Anxiety:  no Other pain:  no Sleep hygiene:  good   HISTORY:  Onset: Headaches since 58 years old but became daily in 2000. Location:  Bilateral periorbital/temples radiating into neck Quality:  throbbing Initial intensity:  Moderate to severe.  She denies new headache, thunderclap headache or severe headache that wakes her from  sleep. Aura:  no Prodrome:  no Postdrome:  no Associated symptoms: Photophobia, phonophobia.  She denies associated nausea, vomiting, visual disturbance, unilateral numbness or weakness. Initial duration:  All day Initial Frequency:  Daily (severe headaches occur once a month) Initial Frequency of abortive medication: Tylenol  3 days a week Triggers: None Aggravating factors: Bending over Relieving factors: None Activity:  Aggravated only when severe   Past NSAIDS:  Ibuprofen , naproxen Past analgesics:  Tylenol , Fioricet, Excedrin Past abortive triptans:  rizatriptan, sumatriptan  tab Past abortive ergotamine:  no Past muscle relaxants:  tizanidine  4mg , balcofen 10mg  Past anti-emetic:  Zofran  ODT 4mg , promethazine  suppository 25mg  Past antihypertensive medications:  lisinopril , HCTZ Past antidepressant medications:  bupropion  Past anticonvulsant medications:   Trokendi  XR (made her feel dizzy) Past anti-CGRP:  Ubrelvy 100mg , Nurtec, Aimovig  140mg , Ajovy  Past vitamins/Herbal/Supplements:  no Past antihistamines/decongestants:  Flonase , Benadryl  Other past therapies:  Botox  (one round because could not afford it)   Family history of headache:  No  PAST MEDICAL HISTORY: Past Medical History:  Diagnosis Date   Anemia    HX   Chronic migraine    neurologist-  dr juliene Saisha Hogue   Depression    Gastric ulcer 2019   Hemorrhoids    Hyperlipidemia    Hypertension 2010   Dr. Niels webb. stable   OCD (obsessive compulsive disorder)    PONV (postoperative nausea and vomiting)    Pre-diabetes 02/20/2021   Urethral lesion     MEDICATIONS: Current Outpatient Medications on File Prior to Visit  Medication Sig Dispense Refill   amLODipine  (NORVASC ) 5 MG tablet Take 5 mg by mouth every evening.  azelastine  (ASTELIN ) 0.1 % nasal spray Place 1 spray into both nostrils daily.      Calcium-Magnesium-Zinc 333-133-5 MG TABS Take 2 tablets by mouth daily.     Cholecalciferol (VITAMIN D3)  1.25 MG (50000 UT) TABS Take by mouth once a week.     citalopram  (CELEXA ) 20 MG tablet Take 20 mg by mouth daily.     ferrous sulfate 325 (65 FE) MG tablet Take 325 mg by mouth daily with breakfast.     montelukast (SINGULAIR) 10 MG tablet Take 10 mg by mouth daily.     Multiple Vitamin (MULTIVITAMIN) capsule Take 1 capsule by mouth daily.     Omega-3 1000 MG CAPS Take by mouth.     pantoprazole  (PROTONIX ) 40 MG tablet Take 40 mg by mouth 2 (two) times daily.     pravastatin  (PRAVACHOL ) 10 MG tablet Take 10 mg by mouth 2 (two) times a week.     Probiotic Product (PROBIOTIC-10 PO) Take 1 capsule by mouth daily.      SUMAtriptan  (IMITREX ) 100 MG tablet TAKE 1 TABLET BY MOUTH AT ONSET OF MIGRAINE. MAY REPEAT DOSE ONCE IN 2 HOURS IF HEADACHE PERSITS OR RECURS 32 tablet 3   vitamin B-12 (CYANOCOBALAMIN ) 500 MCG tablet Take 500 mcg by mouth daily.     No current facility-administered medications on file prior to visit.    ALLERGIES: Allergies  Allergen Reactions   Erythromycin Nausea Only and Other (See Comments)    FAMILY HISTORY: Family History  Problem Relation Age of Onset   Hyperlipidemia Mother    Dementia Mother    Heart disease Father    Heart disease Maternal Grandmother    Heart disease Maternal Grandfather       Objective:  Blood pressure 130/66, pulse 79, height 5' 1 (1.549 m), weight 144 lb (65.3 kg), last menstrual period 11/25/2009, SpO2 95%. General: No acute distress.  Patient appears well-groomed.   Head:  Normocephalic/atraumatic Eyes:  Fundi examined but not visualized Neck: supple, no paraspinal tenderness, full range of motion Heart:  Regular rate and rhythm Neurological Exam: alert and oriented.  Speech fluent and not dysarthric, language intact.  CN II-XII intact. Bulk and tone normal, muscle strength 5/5 throughout.  Sensation to light touch intact.  Deep tendon reflexes 2+ throughout.  Finger to nose testing intact.  Gait normal, Romberg  negative.   Juliene Dunnings, DO  CC:  [place]  Cleotilde, [place]

## 2023-08-26 ENCOUNTER — Encounter: Payer: Self-pay | Admitting: Neurology

## 2023-08-26 ENCOUNTER — Ambulatory Visit: Payer: No Typology Code available for payment source | Admitting: Neurology

## 2023-08-26 VITALS — BP 130/66 | HR 79 | Ht 61.0 in | Wt 144.0 lb

## 2023-08-26 DIAGNOSIS — G43109 Migraine with aura, not intractable, without status migrainosus: Secondary | ICD-10-CM

## 2023-08-26 MED ORDER — EMGALITY 120 MG/ML ~~LOC~~ SOAJ: 120.0000 mg | SUBCUTANEOUS | 11 refills | Status: DC

## 2023-08-26 NOTE — Patient Instructions (Signed)
 Plan to restart emgality  - 2 injections for first dose, then 1 injection every 28 days thereafter Sumatriptan  as needed.  Stop Goody's.  Limit use of pain relievers to no more than 2 days out of week to prevent risk of rebound or medication-overuse headache. Keep headache diary Follow up 6 months.

## 2023-09-08 ENCOUNTER — Telehealth: Payer: Self-pay | Admitting: Pharmacy Technician

## 2023-09-08 NOTE — Telephone Encounter (Signed)
Sheena, Thanks, we will cancel the appt and the tx plan.  @[email or web address] ,  please d/c Vyepti treatment plan.

## 2023-09-08 NOTE — Telephone Encounter (Signed)
Dr. Everlena Cooper / Zenon Mayo,  Needing clarification: current notes states to start patient on Emgality. Would you like to d/c the Vyepti?  Unfortunately the insurance is being very specific and picky about the chart notes.  Notes have to specify decrease in headaches (the percentage) and also specifically state that the patient is doing well on Vyepti, otherwise they will not approve it. (Please update chart notes)  Patient has an upcoming appt 09/13/23 for Vyepti    We will cancel the appt and the auth process if you would like to move forward with Emgality.

## 2023-09-09 ENCOUNTER — Other Ambulatory Visit: Payer: Self-pay | Admitting: Neurology

## 2023-09-12 ENCOUNTER — Ambulatory Visit: Payer: No Typology Code available for payment source

## 2023-10-17 ENCOUNTER — Telehealth: Payer: Self-pay | Admitting: Neurology

## 2023-10-17 NOTE — Telephone Encounter (Signed)
 Pt. [employer] denied refill of EMGALITY due to not "medical Necessity", would like more notes with explanation of medical necessity added to new auth and sent for RX to be filled.

## 2023-10-25 ENCOUNTER — Telehealth: Payer: Self-pay | Admitting: Pharmacy Technician

## 2023-10-25 ENCOUNTER — Other Ambulatory Visit (HOSPITAL_COMMUNITY): Payer: Self-pay

## 2023-10-25 NOTE — Telephone Encounter (Signed)
 Pharmacy Patient Advocate Encounter   Received notification from Pt Calls Messages that prior authorization for Emgality 120mg  autoinjector is required/requested.   Insurance verification completed.   The patient is insured through [employer] ([employer] is the pharmacy processor for PA).   Per test claim: PA required; PA submitted to above mentioned insurance via Prompt PA Key/confirmation #/EOC [id number] Status is pending

## 2023-10-25 NOTE — Telephone Encounter (Signed)
 PA request has been Submitted. New Encounter has been or will be created for follow up. For additional info see Pharmacy Prior Auth telephone encounter from 10/25/23.

## 2023-10-28 ENCOUNTER — Other Ambulatory Visit (HOSPITAL_COMMUNITY): Payer: Self-pay

## 2023-10-28 NOTE — Telephone Encounter (Signed)
 Pharmacy Patient Advocate Encounter  Received notification from [employer] that Prior Authorization for [facility] 120MG  has been APPROVED from 3.4.25 to 3.3.26. Ran test claim, Copay is $35. This test claim was processed through [facility]- copay amounts may vary at other pharmacies due to pharmacy/plan contracts, or as the patient moves through the different stages of their insurance plan.   PA #/Case ID/Reference #: [id number]

## 2023-11-02 NOTE — Telephone Encounter (Signed)
 Patient advised.

## 2023-12-29 ENCOUNTER — Telehealth: Payer: Self-pay | Admitting: Neurology

## 2023-12-29 DIAGNOSIS — Z0279 Encounter for issue of other medical certificate: Secondary | ICD-10-CM

## 2023-12-29 NOTE — Telephone Encounter (Signed)
 Pt brought in FMLA forms to be filled out. She would like them faxed in once completed. $25 form fee was paid. Forms are in Dr. Sherilyn Dings box

## 2024-02-22 ENCOUNTER — Ambulatory Visit: Payer: No Typology Code available for payment source | Admitting: Neurology

## 2024-03-02 NOTE — Progress Notes (Unsigned)
 NEUROLOGY FOLLOW UP OFFICE NOTE  Kristina Dunlap [id number]  Assessment/Plan:   Migraine without aura, without status migrainosus, not intractable Medication overuse headache   1.  Migraine prevention:  Plan to start Emgality .  Provided samples for loading dose. 2.  Migraine rescue:  Continue sumatriptan  as needed.  Advised to discontinue Goodys (may wean off if more tolerable).  Limit use of pain relievers to no more than 2 days out of week to prevent risk of rebound or medication-overuse headache. 3. Keep headache diary 5.  Follow up 4 to 5 months.  Subjective:  Kristina Dunlap is a 58 year old right-handed woman with hypercholesterolemia, hypertension, and GERD who follows up for migraines.   UPDATE: Started Emgality .  She also has since discontinued Goody powder. *** Migraines: Intensity:  severe Duration:  1 hour with sumatriptan  Frequency:  4-5 days a month   Current NSAIDS/analgesics:  Goodys  Current triptans:   no Current ergotamine:  no Current anti-emetic:  no Current muscle relaxants:  no Current anti-anxiolytic:  no Current sleep aide:  no Current Antihypertensive medications: amlodipine  Current Antidepressant medications:   citalopram  60mg  Current Anticonvulsant medications:   None Current anti-CGRP:  Ajovy , Nurtec (rescue) Current Vitamins/Herbal/Supplements:  vitamin D Current Antihistamines/Decongestants:  Zyrtec, Astelin  NS Other therapy:  none   Caffeine:  Decaf tea, no coffee, occasional soda Alcohol:  Occasional  Smoker:  no Diet:  water  Exercise:  no Depression:  no; Anxiety:  no Other pain:  no Sleep hygiene:  good   HISTORY:  Onset: Headaches since 58 years old but became daily in 2000. Location:  Bilateral periorbital/temples radiating into neck Quality:  throbbing Initial intensity:  Moderate to severe.  She denies new headache, thunderclap headache or severe headache that wakes her from sleep. Aura:  no Prodrome:  no Postdrome:   no Associated symptoms: Photophobia, phonophobia.  She denies associated nausea, vomiting, visual disturbance, unilateral numbness or weakness. Initial duration:  All day Initial Frequency:  Daily (severe headaches occur once a month) Initial Frequency of abortive medication: Tylenol  3 days a week Triggers: None Aggravating factors: Bending over Relieving factors: None Activity:  Aggravated only when severe   Past NSAIDS:  Ibuprofen , naproxen Past analgesics:  Tylenol , Fioricet, Excedrin Past abortive triptans:  rizatriptan, sumatriptan  tab Past abortive ergotamine:  no Past muscle relaxants:  tizanidine  4mg , balcofen 10mg  Past anti-emetic:  Zofran  ODT 4mg , promethazine  suppository 25mg  Past antihypertensive medications:  lisinopril , HCTZ Past antidepressant medications:  bupropion  Past anticonvulsant medications:   Trokendi  XR (made her feel dizzy) Past anti-CGRP:  Ubrelvy 100mg , Nurtec, Aimovig  140mg , Ajovy  Past vitamins/Herbal/Supplements:  no Past antihistamines/decongestants:  Flonase , Benadryl  Other past therapies:  Botox  (one round because could not afford it)   Family history of headache:  No  PAST MEDICAL HISTORY: Past Medical History:  Diagnosis Date   Anemia    HX   Chronic migraine    neurologist-  dr juliene Amalie Koran   Depression    Gastric ulcer 2019   Hemorrhoids    Hyperlipidemia    Hypertension 2010   Dr. Niels webb. stable   OCD (obsessive compulsive disorder)    PONV (postoperative nausea and vomiting)    Pre-diabetes 02/20/2021   Urethral lesion     MEDICATIONS: Current Outpatient Medications on File Prior to Visit  Medication Sig Dispense Refill   amLODipine  (NORVASC ) 10 MG tablet Take 10 mg by mouth daily.     azelastine  (ASTELIN ) 0.1 % nasal spray Place 1 spray into both nostrils  daily.      Calcium-Magnesium-Zinc 333-133-5 MG TABS Take 2 tablets by mouth daily.     Cholecalciferol (VITAMIN D3) 1.25 MG (50000 UT) TABS Take by mouth once a week.      citalopram  (CELEXA ) 20 MG tablet Take 20 mg by mouth daily.     ferrous sulfate 325 (65 FE) MG tablet Take 325 mg by mouth daily with breakfast.     Galcanezumab -gnlm (EMGALITY ) 120 MG/ML SOAJ Inject 120 mg into the skin every 28 (twenty-eight) days. 1.12 mL 11   montelukast (SINGULAIR) 10 MG tablet Take 10 mg by mouth daily.     Multiple Vitamin (MULTIVITAMIN) capsule Take 1 capsule by mouth daily.     Omega-3 1000 MG CAPS Take by mouth.     pantoprazole  (PROTONIX ) 40 MG tablet Take 40 mg by mouth 2 (two) times daily.     pravastatin  (PRAVACHOL ) 20 MG tablet Take 20 mg by mouth daily.     Probiotic Product (PROBIOTIC-10 PO) Take 1 capsule by mouth daily.      SUMAtriptan  (IMITREX ) 100 MG tablet TAKE 1 TABLET BY MOUTH AT ONSET OF MIGRAINE. MAY REPEAT DOSE ONCE IN 2 HOURS IF HEADACHE PERSITS OR RECURS 32 tablet 3   vitamin B-12 (CYANOCOBALAMIN ) 500 MCG tablet Take 500 mcg by mouth daily.     No current facility-administered medications on file prior to visit.    ALLERGIES: Allergies  Allergen Reactions   Erythromycin Nausea Only and Other (See Comments)    FAMILY HISTORY: Family History  Problem Relation Age of Onset   Hyperlipidemia Mother    Dementia Mother    Heart disease Father    Heart disease Maternal Grandmother    Heart disease Maternal Grandfather       Objective:  *** General: No acute distress.  Patient appears well-groomed.   Head:  Normocephalic/atraumatic Neck:  Supple.  No paraspinal tenderness.  Full range of motion. Heart:  Regular rate and rhythm. Neuro:  Alert and oriented.  Speech fluent and not dysarthric.  Language intact.  CN II-XII intact.  Bulk and tone normal.  Muscle strength 5/5 throughout.  Sensation to light touch intact.  Deep tendon reflexes 2+ throughout, toes downgoing.  Gait normal.  Romberg negative.    Juliene Dunnings, DO  CC:  Kristina Dunlap  Kristina Dunlap, Kristina Dunlap

## 2024-03-05 ENCOUNTER — Encounter: Payer: Self-pay | Admitting: Neurology

## 2024-03-05 ENCOUNTER — Ambulatory Visit: Admitting: Neurology

## 2024-03-05 VITALS — BP 123/80 | HR 79 | Ht 65.0 in | Wt 137.0 lb

## 2024-03-05 DIAGNOSIS — G43109 Migraine with aura, not intractable, without status migrainosus: Secondary | ICD-10-CM | POA: Diagnosis not present

## 2024-03-05 DIAGNOSIS — G4440 Drug-induced headache, not elsewhere classified, not intractable: Secondary | ICD-10-CM

## 2024-03-05 MED ORDER — TOPIRAMATE 25 MG PO TABS: 25.0000 mg | ORAL_TABLET | Freq: Every day | ORAL | 5 refills | Status: DC

## 2024-03-05 MED ORDER — SUMATRIPTAN SUCCINATE 100 MG PO TABS: ORAL_TABLET | ORAL | 5 refills | Status: AC

## 2024-03-05 NOTE — Patient Instructions (Signed)
  Start topiramate  25mg  at bedtme.  Contact us  in 6 weeks with update and we can increase dose if needed. Continue Emgality  Take sumatriptan  at earliest onset of headache.  May repeat dose once in 2 hours if needed.  Maximum 2 tablets in 24 hours. TAPER OFF OF GOODYS BY ONE DAY EVERY WEEK AS DISCUSSED Limit use of pain relievers to no more than 9 days out of the month.  These medications include acetaminophen , NSAIDs (ibuprofen /Advil /Motrin , naproxen/Aleve, triptans (Imitrex /sumatriptan ), Excedrin, and narcotics.  This will help reduce risk of rebound headaches. Be aware of common food triggers Routine exercise Stay adequately hydrated (aim for 64 oz water  daily) Keep headache diary Maintain proper stress management Maintain proper sleep hygiene Do not skip meals Consider supplements:  magnesium citrate 400mg  daily, riboflavin 400mg  daily, coenzyme Q10 100mg  three times daily.

## 2024-04-20 NOTE — Progress Notes (Unsigned)
 58 y.o. G15P0000 Married Caucasian female here for annual exam.    Has left breast pain off and on for one year.   Has been tender since she has had a breast biopsy below this region.  No lump.   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  Seeing her PCP for this.   PCP: Cleotilde, [place]  E, PA   Patient's last menstrual period was 11/25/2009.           Sexually active: Yes.    The current method of family planning is status post hysterectomy.    Menopausal hormone therapy:  n/a Exercising: No.   Smoker:  no  OB History  Gravida Para Term Preterm AB Living  0 0 0 0 0 0  SAB IAB Ectopic Multiple Live Births  0 0 0 0      HEALTH MAINTENANCE: Last 2 paps:  2010 neg History of abnormal Pap or positive HPV:  yes, years ago per pt  Mammogram:   05/17/23 Breast Density Cat B, BIRADS Cat 1 neg.  Has appointment this month.   Colonoscopy:  05/02/15- due in 10 years  Bone Density:  05/17/23  Result  osteopenia of hips and spine - [facility] -  FRAX:  6.8%/0.4%  Immunization History  Administered Date(s) Administered   Influenza Inj Mdck Quad Pf 06/04/2019   Influenza Inj Mdck Quad With Preservative 07/01/2016   Influenza-Unspecified 07/18/2017   Janssen (J&J) SARS-COV-2 Vaccination 11/04/2019   Tdap 01/05/2013, 04/26/2023   Tetanus 10/02/2002      reports that she has never smoked. She has never used smokeless tobacco. She reports current alcohol use. She reports that she does not use drugs.  Past Medical History:  Diagnosis Date   Anemia    HX   Chronic migraine    neurologist-  dr juliene dunnings   Depression    Gastric ulcer 2019   Hemorrhoids    Hyperlipidemia    Hypertension 2010   Dr. Niels webb. stable   OCD (obsessive compulsive disorder)    PONV (postoperative nausea and vomiting)    Pre-diabetes 02/20/2021   Urethral lesion     Past Surgical History:  Procedure Laterality Date   BREAST BIOPSY Left 07/06/2018   Procedure: LEFT BREAST EXCISIONAL BIOSPY;  Surgeon:  Aron Shoulders, MD;  Location: [facility];  Service: General;  Laterality: Left;   CYSTOSCOPY WITH BIOPSY N/A 12/26/2018   Procedure: CYSTOSCOPY WITH URETHRAL AND TRANSVAGINAL BIOPSY;  Surgeon: Watt Rush, MD;  Location: [facility];  Service: Urology;  Laterality: N/A;   D & C HYSTEROSCOPY WITH RESECTION ENDOMETRIAL POLYP  12-17-2008  dr mandy @[facility]    LAPAROSCOPIC APPENDECTOMY N/A 10/31/2015   Procedure: APPENDECTOMY LAPAROSCOPIC;  Surgeon: Debby Shipper, MD;  [facility];  Service: General;  Laterality: N/A;   ROBOTIC ASSISTED TOTAL HYSTERECTOMY  04/ 05/11    dr romine @ [facility]   simple hyperplasia, fibroids   TONSILLECTOMY  child    Current Outpatient Medications  Medication Sig Dispense Refill   amLODipine  (NORVASC ) 10 MG tablet Take 10 mg by mouth daily.     azelastine  (ASTELIN ) 0.1 % nasal spray Place 1 spray into both nostrils daily.      Calcium-Magnesium-Zinc 333-133-5 MG TABS Take 2 tablets by mouth daily.     Cholecalciferol (VITAMIN D3) 1.25 MG (50000 UT) TABS Take by mouth once a week.     citalopram  (CELEXA ) 20 MG tablet Take 20 mg by mouth daily.     ferrous  sulfate 325 (65 FE) MG tablet Take 325 mg by mouth daily with breakfast.     fluorometholone (FML) 0.1 % ophthalmic suspension SMARTSIG:In Eye(s)     Galcanezumab -gnlm (EMGALITY ) 120 MG/ML SOAJ Inject 120 mg into the skin every 28 (twenty-eight) days. 1.12 mL 11   montelukast (SINGULAIR) 10 MG tablet Take 10 mg by mouth daily.     Multiple Vitamin (MULTIVITAMIN) capsule Take 1 capsule by mouth daily.     Olopatadine HCl (PATADAY) 0.2 % SOLN 1 drop into affected eye Ophthalmic Once a day     Omega-3 1000 MG CAPS Take by mouth.     pantoprazole  (PROTONIX ) 40 MG tablet Take 40 mg by mouth 2 (two) times daily.     pravastatin  (PRAVACHOL ) 20 MG tablet Take 20 mg by mouth daily.     SUMAtriptan  (IMITREX ) 100 MG tablet Take 1 tablet at earliest onset of migraine.  May repeat in 2 hours if headache persists or recurs.   Maximum 2 tablets in 24 hours. 10 tablet 5   topiramate  (TOPAMAX ) 25 MG tablet Take 1 tablet (25 mg total) by mouth at bedtime. 30 tablet 5   triamcinolone  cream (KENALOG) 0.1 % Apply topically 2 (two) times daily.     vitamin B-12 (CYANOCOBALAMIN ) 500 MCG tablet Take 500 mcg by mouth daily.     Probiotic Product (PROBIOTIC-10 PO) Take 1 capsule by mouth daily.  (Patient not taking: Reported on 04/30/2024)     No current facility-administered medications for this visit.    ALLERGIES: Erythromycin  Family History  Problem Relation Age of Onset   Hyperlipidemia Mother    Dementia Mother    Heart disease Father    Heart disease Maternal Grandmother    Heart disease Maternal Grandfather     Review of Systems  PHYSICAL EXAM:  BP 122/80 (BP Location: Left Arm, Patient Position: Sitting)   Pulse 80   Ht 5' 5.5 (1.664 m)   Wt 138 lb (62.6 kg)   LMP 11/25/2009 Comment: age  SpO2 97%   BMI 22.62 kg/m     General appearance: alert, cooperative and appears stated age Head: normocephalic, without obvious abnormality, atraumatic Neck: no adenopathy, supple, symmetrical, trachea midline and thyroid  normal to inspection and palpation Lungs: clear to auscultation bilaterally Breasts: normal appearance, no masses or tenderness, No nipple retraction or dimpling, No nipple discharge or bleeding, No axillary adenopathy Heart: regular rate and rhythm Abdomen: soft, non-tender; no masses, no organomegaly Extremities: extremities normal, atraumatic, no cyanosis or edema Skin: skin color, texture, turgor normal. No rashes or lesions Lymph nodes: cervical, supraclavicular, and axillary nodes normal. Neurologic: grossly normal  Pelvic: External genitalia:  no lesions              No abnormal inguinal nodes palpated.              Urethra:  normal appearing urethra with no masses, tenderness or lesions              Bartholins and Skenes: normal                 Vagina: normal appearing vagina with  normal color and discharge, no lesions              Cervix: no lesions              Pap taken: {yes no:314532} Bimanual Exam:  Uterus:  normal size, contour, position, consistency, mobility, non-tender  Adnexa: no mass, fullness, tenderness              Rectal exam: {yes no:314532}.  Confirms.              Anus:  normal sphincter tone, no lesions  Chaperone was present for exam:  {BSCHAPERONE:31226::Emily F, CMA}  ASSESSMENT: Well woman visit with gynecologic exam. Status post hysterectomy. Ovaries remain.  Vaginal atrophy.  Hx left breast biopsy.  Cystitic cystica.  Hx migraine with aura.  Hx ulcer.  Vegetarian.  Osteopenia.  PHQ-2-9: 0  ***  PLAN: Mammogram screening discussed. Self breast awareness reviewed. Pap and HRV collected:  {yes no:314532} Guidelines for Calcium, Vitamin D, regular exercise program including cardiovascular and weight bearing exercise. Medication refills:  *** {LABS (Optional):23779} Wants mammogram at end of month.   Follow up:  ***    Additional counseling given.  {yes [id number]. ***  total time was spent for this patient encounter, including preparation, face-to-face counseling with the patient, coordination of care, and documentation of the encounter in addition to doing the well woman visit with gynecologic exam.

## 2024-04-30 ENCOUNTER — Encounter: Payer: Self-pay | Admitting: Obstetrics and Gynecology

## 2024-04-30 ENCOUNTER — Ambulatory Visit (INDEPENDENT_AMBULATORY_CARE_PROVIDER_SITE_OTHER): Payer: No Typology Code available for payment source | Admitting: Obstetrics and Gynecology

## 2024-04-30 VITALS — BP 122/80 | HR 80 | Ht 65.5 in | Wt 138.0 lb

## 2024-04-30 DIAGNOSIS — Z01419 Encounter for gynecological examination (general) (routine) without abnormal findings: Secondary | ICD-10-CM

## 2024-04-30 DIAGNOSIS — N644 Mastodynia: Secondary | ICD-10-CM | POA: Diagnosis not present

## 2024-04-30 DIAGNOSIS — Z1331 Encounter for screening for depression: Secondary | ICD-10-CM

## 2024-04-30 NOTE — Patient Instructions (Signed)

## 2024-05-02 ENCOUNTER — Telehealth: Payer: Self-pay | Admitting: Obstetrics and Gynecology

## 2024-05-02 NOTE — Telephone Encounter (Signed)
 Please assist in scheduling dx bilateral mammogram and left breast US  at the [facility] at the end of this month per patient request.  My patient has left medial breast pain.   No mass detected.

## 2024-05-03 NOTE — Telephone Encounter (Signed)
 Spoke with Jerel at [facility]. Patient scheduled for Bilateral Dx MMG and Left breast US  on 05/08/24 at 0815.   Patient notified, left detailed message, ok per dpr. Advised of appt as seen above. If any changes need to be made to appt, contact [facility] directly at [phone]. Return call to office if any additional questions.    Order printed and to Dr. Nikki to be signed and faxed.   Routing to provider for final review.  Will close encounter.

## 2024-05-31 LAB — HM MAMMOGRAPHY

## 2024-06-01 ENCOUNTER — Encounter: Payer: Self-pay | Admitting: Obstetrics and Gynecology

## 2024-06-03 ENCOUNTER — Ambulatory Visit: Payer: Self-pay | Admitting: Obstetrics and Gynecology

## 2024-06-13 ENCOUNTER — Telehealth: Payer: Self-pay | Admitting: Neurology

## 2024-06-13 MED ORDER — TOPIRAMATE 25 MG PO TABS: 50.0000 mg | ORAL_TABLET | Freq: Every day | ORAL | 5 refills | Status: AC

## 2024-06-13 NOTE — Telephone Encounter (Signed)
 Pt called in this afternoon and she stated that her migraine headaches has gotten real bad. Pt stated that she has had 9 migraines this month. Pt stated that the injection shots are not working. Please call. Thanks

## 2024-06-13 NOTE — Telephone Encounter (Signed)
 Who's calling (name and relationship to patient) : Kristina Dunlap; self   Best contact number: [phone]  Provider they see: Dr. Skeet  Reason for call:  Called in wanting to leave a message regarding migraines.

## 2024-06-13 NOTE — Telephone Encounter (Signed)
 Patient advised of Dr.Jaffe note, I wouldn't discontinue Emgality  just yet. I would increase topiramate  to 50mg  at bedtime. Also, I want to make sure that she is limiting use of pain relievers to no more than 9 days a month because at last visit she was at risk for rebound headache with the Goodys.  Per patient she has increased taking the Goody's due to more patient. Advised patient try not to take more then 9 days out of the months th reduce the risk if rebound headaches./

## 2024-06-13 NOTE — Telephone Encounter (Signed)
 Tried calling patient no answer. LMOVM to call the office back, Or send us  a mychart message with her question.

## 2024-09-07 ENCOUNTER — Other Ambulatory Visit: Payer: Self-pay | Admitting: Neurology

## 2024-11-05 ENCOUNTER — Ambulatory Visit: Admitting: Neurology

## 2025-05-01 ENCOUNTER — Ambulatory Visit: Admitting: Obstetrics and Gynecology
